# Patient Record
Sex: Male | Born: 1965 | Race: Black or African American | Hispanic: No | Marital: Married | Smoking: Current every day smoker
Health system: Southern US, Community
[De-identification: ages and names within clinical notes are randomized; demographics above are authoritative.]

## PROBLEM LIST (undated history)

## (undated) DIAGNOSIS — R9431 Abnormal electrocardiogram [ECG] [EKG]: Secondary | ICD-10-CM

## (undated) DIAGNOSIS — G709 Myoneural disorder, unspecified: Secondary | ICD-10-CM

## (undated) DIAGNOSIS — K529 Noninfective gastroenteritis and colitis, unspecified: Secondary | ICD-10-CM

## (undated) DIAGNOSIS — T148XXA Other injury of unspecified body region, initial encounter: Secondary | ICD-10-CM

## (undated) DIAGNOSIS — G629 Polyneuropathy, unspecified: Secondary | ICD-10-CM

## (undated) DIAGNOSIS — I10 Essential (primary) hypertension: Secondary | ICD-10-CM

## (undated) HISTORY — PX: OTHER SURGICAL HISTORY: SHX169

## (undated) HISTORY — PX: FINGER SURGERY: SHX640

## (undated) HISTORY — DX: Abnormal electrocardiogram (ECG) (EKG): R94.31

## (undated) HISTORY — DX: Polyneuropathy, unspecified: G62.9

## (undated) HISTORY — DX: Myoneural disorder, unspecified: G70.9

---

## 1998-07-30 ENCOUNTER — Ambulatory Visit (HOSPITAL_COMMUNITY): Admission: RE | Admit: 1998-07-30 | Discharge: 1998-07-31 | Payer: Self-pay | Admitting: General Surgery

## 2005-06-17 ENCOUNTER — Ambulatory Visit (HOSPITAL_COMMUNITY): Admission: RE | Admit: 2005-06-17 | Discharge: 2005-06-17 | Payer: Self-pay | Admitting: Chiropractic Medicine

## 2007-01-08 ENCOUNTER — Encounter: Admission: RE | Admit: 2007-01-08 | Discharge: 2007-01-08 | Payer: Self-pay | Admitting: Neurology

## 2007-02-02 ENCOUNTER — Encounter: Admission: RE | Admit: 2007-02-02 | Discharge: 2007-02-02 | Payer: Self-pay | Admitting: Neurology

## 2007-03-28 ENCOUNTER — Emergency Department (HOSPITAL_COMMUNITY): Admission: EM | Admit: 2007-03-28 | Discharge: 2007-03-28 | Payer: Self-pay | Admitting: Emergency Medicine

## 2007-04-13 ENCOUNTER — Encounter: Admission: RE | Admit: 2007-04-13 | Discharge: 2007-05-26 | Payer: Self-pay | Admitting: Neurology

## 2007-05-20 ENCOUNTER — Encounter: Admission: RE | Admit: 2007-05-20 | Discharge: 2007-05-20 | Payer: Self-pay | Admitting: Orthopaedic Surgery

## 2007-07-06 ENCOUNTER — Encounter: Admission: RE | Admit: 2007-07-06 | Discharge: 2007-07-06 | Payer: Self-pay | Admitting: Neurosurgery

## 2008-03-23 ENCOUNTER — Emergency Department (HOSPITAL_COMMUNITY): Admission: EM | Admit: 2008-03-23 | Discharge: 2008-03-24 | Payer: Self-pay | Admitting: Emergency Medicine

## 2010-01-26 ENCOUNTER — Emergency Department (HOSPITAL_COMMUNITY): Admission: EM | Admit: 2010-01-26 | Discharge: 2010-01-27 | Payer: Self-pay | Admitting: Emergency Medicine

## 2010-02-14 DEATH — deceased
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
## 2011-11-03 DIAGNOSIS — M1990 Unspecified osteoarthritis, unspecified site: Secondary | ICD-10-CM

## 2011-11-03 HISTORY — DX: Unspecified osteoarthritis, unspecified site: M19.90

## 2013-02-25 ENCOUNTER — Ambulatory Visit: Payer: No Typology Code available for payment source | Attending: Internal Medicine

## 2013-02-26 ENCOUNTER — Encounter (HOSPITAL_COMMUNITY): Payer: Self-pay

## 2013-02-26 ENCOUNTER — Emergency Department (HOSPITAL_COMMUNITY)
Admission: EM | Admit: 2013-02-26 | Discharge: 2013-02-26 | Disposition: A | Discharge status: Home / Self Care | Payer: No Typology Code available for payment source | Source: Home / Self Care | Attending: Family Medicine | Admitting: Family Medicine

## 2013-02-26 DIAGNOSIS — G609 Hereditary and idiopathic neuropathy, unspecified: Secondary | ICD-10-CM

## 2013-02-26 DIAGNOSIS — G629 Polyneuropathy, unspecified: Secondary | ICD-10-CM

## 2013-02-26 HISTORY — DX: Other injury of unspecified body region, initial encounter: T14.8XXA

## 2013-02-26 MED ORDER — GABAPENTIN 600 MG PO TABS: 600.0000 mg | ORAL_TABLET | Freq: Two times a day (BID) | ORAL | Status: DC
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
## 2013-02-27 NOTE — ED Provider Notes (Signed)
Medical screening examination/treatment/procedure(s) were performed by a resident physician or non-physician practitioner and as the supervising physician I was immediately available for consultation/collaboration.  Lavetta Geier, MD    Toshiyuki Fredell S Leland Staszewski, MD 02/27/13 0849 

## 2013-05-05 ENCOUNTER — Encounter (HOSPITAL_COMMUNITY): Payer: Self-pay | Admitting: Emergency Medicine

## 2013-05-05 ENCOUNTER — Emergency Department (HOSPITAL_COMMUNITY)
Admission: EM | Admit: 2013-05-05 | Discharge: 2013-05-05 | Disposition: A | Discharge status: Home / Self Care | Payer: No Typology Code available for payment source | Source: Home / Self Care | Attending: Family Medicine | Admitting: Family Medicine

## 2013-05-05 DIAGNOSIS — G629 Polyneuropathy, unspecified: Secondary | ICD-10-CM

## 2013-05-05 DIAGNOSIS — G589 Mononeuropathy, unspecified: Secondary | ICD-10-CM

## 2013-05-05 MED ORDER — GABAPENTIN 600 MG PO TABS: 600.0000 mg | ORAL_TABLET | Freq: Two times a day (BID) | ORAL | Status: DC
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
## 2013-05-05 NOTE — ED Notes (Signed)
Here for refill of gabapentin; NAD

## 2013-07-07 ENCOUNTER — Emergency Department (INDEPENDENT_AMBULATORY_CARE_PROVIDER_SITE_OTHER)
Admission: EM | Admit: 2013-07-07 | Discharge: 2013-07-07 | Disposition: A | Discharge status: Home / Self Care | Payer: Self-pay | Source: Home / Self Care

## 2013-07-07 ENCOUNTER — Encounter (HOSPITAL_COMMUNITY): Payer: Self-pay | Admitting: Emergency Medicine

## 2013-07-07 DIAGNOSIS — IMO0002 Reserved for concepts with insufficient information to code with codable children: Secondary | ICD-10-CM

## 2013-07-07 DIAGNOSIS — M792 Neuralgia and neuritis, unspecified: Secondary | ICD-10-CM

## 2013-07-07 MED ORDER — GABAPENTIN 300 MG PO CAPS: ORAL_CAPSULE | ORAL | Status: DC

## 2013-07-07 NOTE — ED Provider Notes (Signed)
CSN: [id number]     Arrival date & time 07/07/13  [id number] History   None    Chief Complaint  Patient presents with  . Medication Refill   (Consider location/radiation/quality/duration/timing/severity/associated sxs/prior Treatment) HPI Comments: 48 year old male who presents to the urgent care for routine refill of his gabapentin. He has neuropathic pain resulting from an on the relaxant. He has had tenderness and urology referral. States the gabapentin helped significantly with pain. He does not have a physician yet but states his insurance will go into effect in 4 days.    Past Medical History  Diagnosis Date  . Nerve damage    History reviewed. No pertinent past surgical history. No family history on file. History  Substance Use Topics  . Smoking status: Current Every Day Smoker  . Smokeless tobacco: Not on file  . Alcohol Use: Yes    Review of Systems  Constitutional: Negative.   Respiratory: Negative.   Cardiovascular: Negative.   Skin: Negative.     Allergies  Review of patient's allergies indicates no known allergies.  Home Medications   Current Outpatient Rx  Name  Route  Sig  Dispense  Refill  . gabapentin (NEURONTIN) 600 MG tablet   Oral   Take 1 tablet (600 mg total) by mouth 2 (two) times daily.   60 tablet   1   . gabapentin (NEURONTIN) 300 MG capsule      2 caps BID   120 capsule   0    BP 135/78  Pulse 70  Temp(Src) 98.8 F (37.1 C) (Oral)  Resp 20  SpO2 100% Physical Exam  Nursing note and vitals reviewed. Constitutional: He is oriented to person, place, and time. He appears well-developed and well-nourished. No distress.  Neck: Normal range of motion. Neck supple.  Pulmonary/Chest: Effort normal. No respiratory distress.  Neurological: He is alert and oriented to person, place, and time.  Skin: Skin is dry.  Psychiatric: He has a normal mood and affect.    ED Course  Procedures (including critical care time) Labs Review Labs Reviewed  - No data to display Imaging Review No results found.    MDM   1. Neuropathic pain    Pain and make an appointment with a PCP soon as possible. Refill gabapentin 300 mg 2 caps twice a day #120.    [name] Missy Baksh, NP 07/07/13 1058

## 2013-07-07 NOTE — ED Notes (Signed)
Medication refill-gabapentin 300mg  2 capsules taken 2 times a day

## 2013-07-07 NOTE — Discharge Instructions (Signed)

## 2013-07-07 NOTE — ED Provider Notes (Signed)
Medical screening examination/treatment/procedure(s) were performed by non-physician practitioner and as supervising physician I was immediately available for consultation/collaboration.  Surafel Hilleary, M.D.   Euva Rundell C Bellarae Lizer, MD 07/07/13 1811 

## 2013-08-07 ENCOUNTER — Telehealth (HOSPITAL_COMMUNITY): Payer: Self-pay

## 2013-08-07 NOTE — ED Notes (Signed)
Patient presents to registration desk requesting to be seen for another refill of his medication.  Chart reviewed by [name] Mabe NP.  He stated that patient has insurance now and was instructed to find a PCP.  NP states we will not refill his medication again and that he need to find a PCP.  Patient made aware

## 2013-08-08 ENCOUNTER — Ambulatory Visit (INDEPENDENT_AMBULATORY_CARE_PROVIDER_SITE_OTHER): Payer: BC Managed Care – PPO | Admitting: Emergency Medicine

## 2013-08-08 VITALS — BP 142/92 | HR 76 | Temp 98.2°F | Resp 17 | Ht 72.0 in | Wt 252.0 lb

## 2013-08-08 DIAGNOSIS — G629 Polyneuropathy, unspecified: Secondary | ICD-10-CM

## 2013-08-08 DIAGNOSIS — G589 Mononeuropathy, unspecified: Secondary | ICD-10-CM

## 2013-08-08 MED ORDER — GABAPENTIN 600 MG PO TABS: 600.0000 mg | ORAL_TABLET | Freq: Two times a day (BID) | ORAL | Status: DC
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
## 2013-08-08 NOTE — Patient Instructions (Signed)

## 2013-10-19 ENCOUNTER — Other Ambulatory Visit: Payer: Self-pay | Admitting: Emergency Medicine

## 2014-01-22 ENCOUNTER — Ambulatory Visit (INDEPENDENT_AMBULATORY_CARE_PROVIDER_SITE_OTHER): Payer: Self-pay | Admitting: Family Medicine

## 2014-01-22 VITALS — BP 142/90 | HR 73 | Temp 98.0°F | Resp 16 | Ht 72.0 in | Wt 252.0 lb

## 2014-01-22 DIAGNOSIS — Z0184 Encounter for antibody response examination: Secondary | ICD-10-CM

## 2014-01-22 DIAGNOSIS — G629 Polyneuropathy, unspecified: Secondary | ICD-10-CM

## 2014-01-22 DIAGNOSIS — M545 Low back pain, unspecified: Secondary | ICD-10-CM

## 2014-01-22 DIAGNOSIS — G609 Hereditary and idiopathic neuropathy, unspecified: Secondary | ICD-10-CM

## 2014-01-22 DIAGNOSIS — Z789 Other specified health status: Secondary | ICD-10-CM

## 2014-01-22 MED ORDER — GABAPENTIN 600 MG PO TABS: ORAL_TABLET | ORAL | Status: DC

## 2014-01-22 NOTE — Patient Instructions (Addendum)
I refilled Neurontin for now, but you will need to call neurologist as referred prior for appointment - their information is below.  When possible, schedule physical with primary provider - we are happy to see you here, and you can schedule an appointment with Dr. Carlota Raspberry or any provider accepting new patients.  You should receive a call or letter about your lab results within the next week to 10 days. If you are not immune to MMR or varicella, can discuss these vaccinations. Return to the clinic or go to the nearest emergency room if any of your symptoms worsen or new symptoms occur.  [facility] [address], [place], [place] [address] [phone]
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
## 2014-01-24 LAB — MEASLES/MUMPS/RUBELLA IMMUNITY
MUMPS IGG: 156 [AU]/ml — AB (ref <9.00)
RUBELLA: 3.15 {index} — AB (ref <0.90)
Rubeola IgG: 113 AU/mL — ABNORMAL HIGH (ref <25.00)

## 2014-01-24 LAB — VARICELLA ZOSTER ANTIBODY, IGG: Varicella IgG: 673.9 Index — ABNORMAL HIGH (ref <135.00)

## 2014-03-22 ENCOUNTER — Telehealth: Payer: Self-pay

## 2014-03-22 NOTE — Telephone Encounter (Signed)
Patients neurology appointment was cancelled because [facility] could not get in contact with him. Patient would like another referral to be placed, as he is now ready to follow through with the referral.

## 2014-03-22 NOTE — Telephone Encounter (Signed)
Can this patient still be scheduled? Please contact pt. He would like to follow up with the neurologist.

## 2014-04-17 ENCOUNTER — Encounter: Payer: Self-pay | Admitting: Neurology

## 2014-04-17 ENCOUNTER — Ambulatory Visit (INDEPENDENT_AMBULATORY_CARE_PROVIDER_SITE_OTHER): Payer: Self-pay | Admitting: Neurology

## 2014-04-17 VITALS — BP 160/104 | HR 81 | Ht 72.0 in | Wt 255.0 lb

## 2014-04-17 DIAGNOSIS — G629 Polyneuropathy, unspecified: Secondary | ICD-10-CM | POA: Insufficient documentation

## 2014-04-17 DIAGNOSIS — M545 Low back pain: Secondary | ICD-10-CM

## 2014-04-17 MED ORDER — GABAPENTIN 600 MG PO TABS: ORAL_TABLET | ORAL | Status: DC
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
## 2015-03-29 ENCOUNTER — Other Ambulatory Visit: Payer: Self-pay | Admitting: Neurology

## 2015-05-07 ENCOUNTER — Other Ambulatory Visit: Payer: Self-pay | Admitting: Neurology

## 2015-05-07 ENCOUNTER — Telehealth: Payer: Self-pay | Admitting: Neurology

## 2015-05-07 NOTE — Telephone Encounter (Signed)
Patient called to request refill of Gabapentin, has completely ran out of medication, called our office on Friday after office had closed.

## 2015-05-07 NOTE — Telephone Encounter (Signed)
Rx has been sent  

## 2015-06-01 ENCOUNTER — Other Ambulatory Visit: Payer: Self-pay | Admitting: Neurology

## 2015-07-03 ENCOUNTER — Ambulatory Visit (INDEPENDENT_AMBULATORY_CARE_PROVIDER_SITE_OTHER): Payer: Self-pay | Admitting: Family Medicine

## 2015-07-03 VITALS — BP 138/84 | HR 95 | Temp 98.9°F | Resp 17 | Ht 72.0 in | Wt 252.0 lb

## 2015-07-03 DIAGNOSIS — G609 Hereditary and idiopathic neuropathy, unspecified: Secondary | ICD-10-CM

## 2015-07-03 MED ORDER — GABAPENTIN 600 MG PO TABS: ORAL_TABLET | ORAL | Status: DC

## 2015-07-03 MED FILL — GABAPENTIN 600 MG TABLET: 600 | 30 days supply | Qty: 60 | Fill #0

## 2015-07-03 NOTE — Progress Notes (Signed)
[facility] [address] [place] [address] [phone]  Date:  07/03/2015   Name:  Justin Burnett   DOB:  1966-02-15   MRN:  [id number]  PCP:  Jeanann Lewandowsky, MD    Chief Complaint: Medication Refill   History of Present Illness:  Justin Burnett is a 50 y.o. very pleasant male patient who presents with the following:  History of neuropathy.  Here today seeking a refill of his gabapentin. He takes this for his lower leg and foot pain- he has had the neuropathy for 5 years or so,  The gabapentin works well for him  He is uninsured and currently does not have a job- he would like very much to find a job.  Finances are a worry   He had been seen at [facility] and they had filled this last year.  However he was told that he needed to be seen prior to refills and we were his most cost effective option  Patient Active Problem List   Diagnosis Date Noted  . Neuropathy [facility])     Past Medical History  Diagnosis Date  . Nerve damage   . Neuropathy [facility])     Past Surgical History  Procedure Laterality Date  . None      Social History  Substance Use Topics  . Smoking status: Current Every Day Smoker -- 0.50 packs/day for 13 years    Types: Cigarettes  . Smokeless tobacco: Never Used  . Alcohol Use: 1.2 oz/week    2 Standard drinks or equivalent per week     Comment: per week    No family history on file.  Allergies  Allergen Reactions  . Demerol [Meperidine] Other (See Comments)    Medication list has been reviewed and updated.  Current Outpatient Prescriptions on File Prior to Visit  Medication Sig Dispense Refill  . gabapentin (NEURONTIN) 600 MG tablet TAKE 1 TABLET BY MOUTH TWICE DAILY **PLEASE SCHEDULE APPOINTMENT** 60 tablet 0   No current facility-administered medications on file prior to visit.    Review of Systems:  As per HPI- otherwise negative.   Physical Examination: Filed Vitals:   07/03/15 1532  BP: 138/84   Pulse: 95  Temp: 98.9 F (37.2 C)  Resp: 17   Filed Vitals:   07/03/15 1532  Height: 6' (1.829 m)  Weight: 252 lb (114.306 kg)   Body mass index is 34.17 kg/(m^2). Ideal Body Weight: Weight in (lb) to have BMI = 25: 183.9  GEN: WDWN, NAD, Non-toxic, A & O x 3, looks well, large build HEENT: Atraumatic, Normocephalic. Neck supple. No masses, No LAD. Ears and Nose: No external deformity. CV: RRR, No M/G/R. No JVD. No thrill. No extra heart sounds. PULM: CTA B, no wheezes, crackles, rhonchi. No retractions. No resp. distress. No accessory muscle use. ABD: S, NT, ND, +BS. No rebound. No HSM. EXTR: No c/c/e.  He endorses reduced/ numb feeling sensation of both feet in a sock distribution  NEURO Normal gait.  PSYCH: Normally interactive. Conversant. Not depressed or anxious appearing.  Calm demeanor.    Assessment and Plan: Hereditary and idiopathic peripheral neuropathy - Plan: gabapentin (NEURONTIN) 600 MG tablet  Refilled his gabapentin which has been effective for him  Signed Abbe Amsterdam, MD

## 2015-07-03 NOTE — Patient Instructions (Addendum)
It was good to see you today I refilled your medication for a year.  Take care!

## 2015-08-03 MED FILL — GABAPENTIN 600 MG TABLET: 600 | 30 days supply | Qty: 60 | Fill #1

## 2015-08-31 MED FILL — GABAPENTIN 600 MG TABLET: 600 | 30 days supply | Qty: 60 | Fill #2

## 2015-09-28 MED FILL — GABAPENTIN 600 MG TABLET: 600 | 30 days supply | Qty: 60 | Fill #3

## 2015-10-29 MED FILL — GABAPENTIN 600 MG TABLET: 600 | 30 days supply | Qty: 60 | Fill #4

## 2015-11-27 MED FILL — GABAPENTIN 600 MG TABLET: 600 | 30 days supply | Qty: 60 | Fill #5

## 2015-12-25 MED FILL — GABAPENTIN 600 MG TABLET: 600 | 30 days supply | Qty: 60 | Fill #6

## 2016-01-21 MED FILL — GABAPENTIN 600 MG TABLET: 600 | 30 days supply | Qty: 60 | Fill #7

## 2016-02-19 MED FILL — GABAPENTIN 600 MG TABLET: 600 | 30 days supply | Qty: 60 | Fill #8

## 2016-03-18 MED FILL — GABAPENTIN 600 MG TABLET: 600 | 30 days supply | Qty: 60 | Fill #9

## 2016-04-17 MED FILL — GABAPENTIN 600 MG TABLET: 600 | 30 days supply | Qty: 60 | Fill #10

## 2016-05-16 MED FILL — GABAPENTIN 600 MG TABLET: 600 | 30 days supply | Qty: 60 | Fill #11

## 2016-06-20 ENCOUNTER — Ambulatory Visit (INDEPENDENT_AMBULATORY_CARE_PROVIDER_SITE_OTHER): Payer: Self-pay | Admitting: Family Medicine

## 2016-06-20 VITALS — BP 170/104 | HR 96 | Temp 98.4°F | Resp 18 | Ht 72.0 in | Wt 253.2 lb

## 2016-06-20 DIAGNOSIS — R030 Elevated blood-pressure reading, without diagnosis of hypertension: Secondary | ICD-10-CM

## 2016-06-20 DIAGNOSIS — G609 Hereditary and idiopathic neuropathy, unspecified: Secondary | ICD-10-CM

## 2016-06-20 MED ORDER — HYDROCHLOROTHIAZIDE 25 MG PO TABS: 12.5000 mg | ORAL_TABLET | Freq: Every day | ORAL | 0 refills | Status: DC

## 2016-06-20 MED ORDER — GABAPENTIN 800 MG PO TABS: ORAL_TABLET | ORAL | 3 refills | Status: DC

## 2016-06-20 MED FILL — GABAPENTIN 800 MG TABLET: 800 | 30 days supply | Qty: 60 | Fill #0

## 2016-06-20 NOTE — Patient Instructions (Addendum)
Please schedule an annual physical exam within the next 4 weeks and return for blood pressure recheck.  Your blood pressure is elevated today. Please continue to monitor and if it remains elevated, greater than 140/80 please return for care.   IF you received an x-ray today, you will receive an invoice from [facility]. Please contact [facility] at [phone] with questions or concerns regarding your invoice.   IF you received labwork today, you will receive an invoice from [facility]. Please contact [facility] at [phone] with questions or concerns regarding your invoice.   Our billing staff will not be able to assist you with questions regarding bills from these companies.  You will be contacted with the lab results as soon as they are available. The fastest way to get your results is to activate your My Chart account. Instructions are located on the last page of this paperwork. If you have not heard from [place] regarding the results in 2 weeks, please contact this office.      Neuropathic Pain Introduction Neuropathic pain is pain caused by damage to the nerves that are responsible for certain sensations in your body (sensory nerves). The pain can be caused by damage to:  The sensory nerves that send signals to your spinal cord and brain (peripheral nervous system).  The sensory nerves in your brain or spinal cord (central nervous system). Neuropathic pain can make you more sensitive to pain. What would be a minor sensation for most people may feel very painful if you have neuropathic pain. This is usually a long-term condition that can be difficult to treat. The type of pain can differ from person to person. It may start suddenly (acute), or it may develop slowly and last for a long time (chronic). Neuropathic pain may come and go as damaged nerves heal or may stay at the same level for years. It often causes emotional distress, loss of sleep, and a lower quality of  life. What are the causes? The most common cause of damage to a sensory nerve is diabetes. Many other diseases and conditions can also cause neuropathic pain. Causes of neuropathic pain can be classified as:  Toxic. Many drugs and chemicals can cause toxic damage. The most common cause of toxic neuropathic pain is damage from drug treatment for cancer (chemotherapy).  Metabolic. This type of pain can happen when a disease causes imbalances that damage nerves. Diabetes is the most common of these diseases. Vitamin B deficiency caused by long-term alcohol abuse is another common cause.  Traumatic. Any injury that cuts, crushes, or stretches a nerve can cause damage and pain. A common example is feeling pain after losing an arm or leg (phantom limb pain).  Compression-related. If a sensory nerve gets trapped or compressed for a long period of time, the blood supply to the nerve can be cut off.  Vascular. Many blood vessel diseases can cause neuropathic pain by decreasing blood supply and oxygen to nerves.  Autoimmune. This type of pain results from diseases in which the body's defense system mistakenly attacks sensory nerves. Examples of autoimmune diseases that can cause neuropathic pain include lupus and multiple sclerosis.  Infectious. Many types of viral infections can damage sensory nerves and cause pain. Shingles infection is a common cause of this type of pain.  Inherited. Neuropathic pain can be a symptom of many diseases that are passed down through families (genetic). What are the signs or symptoms? The main symptom is pain. Neuropathic pain is often described as:  Burning.  Shock-like.  Stinging.  Hot or cold.  Itching. How is this diagnosed? No single test can diagnose neuropathic pain. Your health care provider will do a physical exam and ask you about your pain. You may use a pain scale to describe how bad your pain is. You may also have tests to see if you have a high  sensitivity to pain and to help find the cause and location of any sensory nerve damage. These tests may include:  Imaging studies, such as:  X-rays.  CT scan.  MRI.  Nerve conduction studies to test how well nerve signals travel through your sensory nerves (electrodiagnostic testing).  Stimulating your sensory nerves through electrodes on your skin and measuring the response in your spinal cord and brain (somatosensory evoked potentials). How is this treated? Treatment for neuropathic pain may change over time. You may need to try different treatment options or a combination of treatments. Some options include:  Over-the-counter pain relievers.  Prescription medicines. Some medicines used to treat other conditions may also help neuropathic pain. These include medicines to:  Control seizures (anticonvulsants).  Relieve depression (antidepressants).  Prescription-strength pain relievers (narcotics). These are usually used when other pain relievers do not help.  Transcutaneous nerve stimulation (TENS). This uses electrical currents to block painful nerve signals. The treatment is painless.  Topical and local anesthetics. These are medicines that numb the nerves. They can be injected as a nerve block or applied to the skin.  Alternative treatments, such as:  Acupuncture.  Meditation.  Massage.  Physical therapy.  Pain management programs.  Counseling. Follow these instructions at home:  Learn as much as you can about your condition.  Take medicines only as directed by your health care provider.  Work closely with all your health care providers to find what works best for you.  Have a good support system at home.  Consider joining a chronic pain support group. Contact a health care provider if:  Your pain treatments are not helping.  You are having side effects from your medicines.  You are struggling with fatigue, mood changes, depression, or anxiety. This  information is not intended to replace advice given to you by your health care provider. Make sure you discuss any questions you have with your health care provider. Document Released: 02/28/2004 Document Revised: 12/21/2015 Document Reviewed: 11/10/2013  2017 Elsevier  Hypertension Hypertension, commonly called high blood pressure, is when the force of blood pumping through your arteries is too strong. Your arteries are the blood vessels that carry blood from your heart throughout your body. A blood pressure reading consists of a higher number over a lower number, such as 110/72. The higher number (systolic) is the pressure inside your arteries when your heart pumps. The lower number (diastolic) is the pressure inside your arteries when your heart relaxes. Ideally you want your blood pressure below 120/80. Hypertension forces your heart to work harder to pump blood. Your arteries may become narrow or stiff. Having untreated or uncontrolled hypertension can cause heart attack, stroke, kidney disease, and other problems. What increases the risk? Some risk factors for high blood pressure are controllable. Others are not. Risk factors you cannot control include:  Race. You may be at higher risk if you are African American.  Age. Risk increases with age.  Gender. Men are at higher risk than women before age 70 years. After age 88, women are at higher risk than men. Risk factors you can control include:  Not getting enough  exercise or physical activity.  Being overweight.  Getting too much fat, sugar, calories, or salt in your diet.  Drinking too much alcohol. What are the signs or symptoms? Hypertension does not usually cause signs or symptoms. Extremely high blood pressure (hypertensive crisis) may cause headache, anxiety, shortness of breath, and nosebleed. How is this diagnosed? To check if you have hypertension, your health care provider will measure your blood pressure while you are  seated, with your arm held at the level of your heart. It should be measured at least twice using the same arm. Certain conditions can cause a difference in blood pressure between your right and left arms. A blood pressure reading that is higher than normal on one occasion does not mean that you need treatment. If it is not clear whether you have high blood pressure, you may be asked to return on a different day to have your blood pressure checked again. Or, you may be asked to monitor your blood pressure at home for 1 or more weeks. How is this treated? Treating high blood pressure includes making lifestyle changes and possibly taking medicine. Living a healthy lifestyle can help lower high blood pressure. You may need to change some of your habits. Lifestyle changes may include:  Following the DASH diet. This diet is high in fruits, vegetables, and whole grains. It is low in salt, red meat, and added sugars.  Keep your sodium intake below 2,300 mg per day.  Getting at least 30-45 minutes of aerobic exercise at least 4 times per week.  Losing weight if necessary.  Not smoking.  Limiting alcoholic beverages.  Learning ways to reduce stress. Your health care provider may prescribe medicine if lifestyle changes are not enough to get your blood pressure under control, and if one of the following is true:  You are 53-45 years of age and your systolic blood pressure is above 140.  You are 33 years of age or older, and your systolic blood pressure is above 150.  Your diastolic blood pressure is above 90.  You have diabetes, and your systolic blood pressure is over 140 or your diastolic blood pressure is over 90.  You have kidney disease and your blood pressure is above 140/90.  You have heart disease and your blood pressure is above 140/90. Your personal target blood pressure may vary depending on your medical conditions, your age, and other factors. Follow these instructions at  home:  Have your blood pressure rechecked as directed by your health care provider.  Take medicines only as directed by your health care provider. Follow the directions carefully. Blood pressure medicines must be taken as prescribed. The medicine does not work as well when you skip doses. Skipping doses also puts you at risk for problems.  Do not smoke.  Monitor your blood pressure at home as directed by your health care provider. Contact a health care provider if:  You think you are having a reaction to medicines taken.  You have recurrent headaches or feel dizzy.  You have swelling in your ankles.  You have trouble with your vision. Get help right away if:  You develop a severe headache or confusion.  You have unusual weakness, numbness, or feel faint.  You have severe chest or abdominal pain.  You vomit repeatedly.  You have trouble breathing. This information is not intended to replace advice given to you by your health care provider. Make sure you discuss any questions you have with your health care provider.  Document Released: 06/02/2005 Document Revised: 11/08/2015 Document Reviewed: 03/25/2013 Elsevier Interactive Patient Education  2017 [employer]

## 2016-06-20 NOTE — Progress Notes (Signed)
Patient ID: Justin Burnett, male    DOB: [birth year], 51 y.o.   MRN: [id number]  PCP: Jeanann Lewandowsky, MD  Chief Complaint  Patient presents with  . Medication Refill    Gabapentin    Subjective:  HPI  51 year old male presents for medication refill.  Neuropathy Pt reports a chronic hx of bilateral lower leg and foot neuropathy. He has chronically taken Gabapentin 600 mg BID for management of symptoms.  He reports good control of pain with medication, although he would be interested increased dose of Gabapentin.  Elevated Blood Pressure Reports that he works out to control his blood pressure. He reports that its been several years since he has had a  complete physical exam.    Social History   Social History  . Marital status: Married    Spouse name: Justin Burnett   . Number of children: 1  . Years of education: GED   Occupational History  . Not on file.   Social History Main Topics  . Smoking status: Current Every Day Smoker    Packs/day: 0.50    Years: 13.00    Types: Cigarettes  . Smokeless tobacco: Never Used  . Alcohol use 1.2 oz/week    2 Standard drinks or equivalent per week     Comment: per week  . Drug use: No  . Sexual activity: Not on file   Other Topics Concern  . Not on file   Social History Narrative   Patient lives at home with his wife Environmental education officer).   Patient is unemployed   Engineer, drilling. School.   Right handed.    Caffeine coffee and mountain dew.       No family history on file. Review of Systems  See HPI  Patient Active Problem List   Diagnosis Date Noted  . Neuropathy (HCC)     Allergies  Allergen Reactions  . Demerol [Meperidine] Other (See Comments)    Prior to Admission medications   Medication Sig Start Date End Date Taking? Authorizing Provider  gabapentin (NEURONTIN) 600 MG tablet TAKE 1 TABLET BY MOUTH TWICE DAILY 07/03/15  Yes Pearline Cables, MD    Past Medical, Surgical Family and Social History reviewed  and updated.    Objective:   Today's Vitals   06/20/16 1445  BP: (!) 170/104  Pulse: 96  Resp: 18  Temp: 98.4 F (36.9 C)  TempSrc: Oral  SpO2: 98%  Weight: 253 lb 3.2 oz (114.9 kg)  Height: 6' (1.829 m)    Wt Readings from Last 3 Encounters:  06/20/16 253 lb 3.2 oz (114.9 kg)  07/03/15 252 lb (114.3 kg)  04/17/14 255 lb (115.7 kg)   Physical Exam  Constitutional: He is oriented to person, place, and time. He appears well-developed and well-nourished.  HENT:  Head: Normocephalic and atraumatic.  Right Ear: External ear normal.  Left Ear: External ear normal.  Eyes: Pupils are equal, round, and reactive to light.  Neck: Normal range of motion. Neck supple.  Cardiovascular: Normal rate, regular rhythm, normal heart sounds and intact distal pulses.   No murmur heard. Pulmonary/Chest: Effort normal and breath sounds normal.  Neurological: He is alert and oriented to person, place, and time.  Skin: Skin is warm and dry.  Psychiatric: He has a normal mood and affect. His behavior is normal. Judgment and thought content normal.     Assessment & Plan:  1. Hereditary and idiopathic peripheral neuropathy - gabapentin (NEURONTIN) 800 MG tablet;  TAKE 1 TABLET BY MOUTH TWICE DAILY  Dispense: 180 tablet; Refill: 3  2. Elevated blood pressure reading without diagnosis of hypertension Refused to take blood pressure medication. Blood pressure elevated greater than 160 x 2 readings. Patient reports that his pressures are normal in the 120's when he works out. I prescribed 12.5 HCTZ and requested that he return for a blood pressure recheck   -Hydrochlorothiazide 12.5 daily.   Schedule physical exam within the next 4 weeks.  Godfrey PickKimberly S. Tiburcio PeaHarris, MSN, FNP-C [facility] [phone]

## 2016-07-30 MED FILL — GABAPENTIN 800 MG TABLET: 800 | 30 days supply | Qty: 60 | Fill #1

## 2016-09-12 MED FILL — GABAPENTIN 800 MG TABLET: 800 | 30 days supply | Qty: 60 | Fill #2

## 2016-10-20 MED FILL — GABAPENTIN 800 MG TABLET: 800 | 30 days supply | Qty: 60 | Fill #3

## 2016-11-25 MED FILL — GABAPENTIN 800 MG TABLET: 800 | 30 days supply | Qty: 60 | Fill #4

## 2016-12-30 MED FILL — GABAPENTIN 800 MG TABLET: 800 | 30 days supply | Qty: 60 | Fill #5

## 2017-02-02 MED FILL — GABAPENTIN 800 MG TABLET: 800 | 30 days supply | Qty: 60 | Fill #6

## 2017-03-02 MED FILL — GABAPENTIN 800 MG TABLET: 800 | 30 days supply | Qty: 60 | Fill #7

## 2017-04-02 MED FILL — GABAPENTIN 800 MG TABLET: 800 | 30 days supply | Qty: 60 | Fill #8

## 2017-04-30 MED FILL — GABAPENTIN 800 MG TABLET: 800 | 30 days supply | Qty: 60 | Fill #9

## 2017-06-01 MED FILL — GABAPENTIN 800 MG TABLET: 800 | 30 days supply | Qty: 60 | Fill #10

## 2017-07-01 ENCOUNTER — Other Ambulatory Visit: Payer: Self-pay | Admitting: Family Medicine

## 2017-07-01 DIAGNOSIS — G609 Hereditary and idiopathic neuropathy, unspecified: Secondary | ICD-10-CM

## 2017-07-03 ENCOUNTER — Other Ambulatory Visit: Payer: Self-pay | Admitting: Family Medicine

## 2017-07-03 DIAGNOSIS — G609 Hereditary and idiopathic neuropathy, unspecified: Secondary | ICD-10-CM

## 2017-07-04 ENCOUNTER — Other Ambulatory Visit: Payer: Self-pay

## 2017-07-04 ENCOUNTER — Encounter (HOSPITAL_COMMUNITY): Payer: Self-pay | Admitting: Emergency Medicine

## 2017-07-04 ENCOUNTER — Ambulatory Visit (HOSPITAL_COMMUNITY)
Admission: EM | Admit: 2017-07-04 | Discharge: 2017-07-04 | Disposition: A | Discharge status: Home / Self Care | Payer: Self-pay | Attending: Family Medicine | Admitting: Family Medicine

## 2017-07-04 DIAGNOSIS — G608 Other hereditary and idiopathic neuropathies: Secondary | ICD-10-CM

## 2017-07-04 DIAGNOSIS — G609 Hereditary and idiopathic neuropathy, unspecified: Secondary | ICD-10-CM

## 2017-07-04 DIAGNOSIS — Z760 Encounter for issue of repeat prescription: Secondary | ICD-10-CM

## 2017-07-04 HISTORY — DX: Essential (primary) hypertension: I10

## 2017-07-04 MED ORDER — GABAPENTIN 800 MG PO TABS: ORAL_TABLET | ORAL | 2 refills | Status: DC
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
## 2017-07-04 NOTE — ED Triage Notes (Signed)
Requesting gabapentin refills, denies having a pcp.  States taking medicine for shooting pain into feet

## 2017-07-13 MED FILL — GABAPENTIN 800 MG TABLET: 800 | 30 days supply | Qty: 60 | Fill #0

## 2017-08-14 MED FILL — GABAPENTIN 800 MG TABLET: 800 | 30 days supply | Qty: 60 | Fill #1

## 2017-09-14 MED FILL — GABAPENTIN 800 MG TABLET: 800 | 30 days supply | Qty: 60 | Fill #2

## 2017-10-13 MED FILL — GABAPENTIN 800 MG TABLET: 800 | 30 days supply | Qty: 60 | Fill #3

## 2017-11-11 MED FILL — GABAPENTIN 800 MG TABLET: 800 | 30 days supply | Qty: 60 | Fill #4

## 2017-12-10 MED FILL — GABAPENTIN 800 MG TABLET: 800 | 30 days supply | Qty: 60 | Fill #5

## 2018-01-08 MED FILL — GABAPENTIN 800 MG TABLET: 800 | 30 days supply | Qty: 60 | Fill #6

## 2018-02-05 MED FILL — GABAPENTIN 800 MG TABLET: 800 | 30 days supply | Qty: 60 | Fill #7

## 2018-03-09 MED FILL — GABAPENTIN 800 MG TABLET: 800 | 20 days supply | Qty: 40 | Fill #8

## 2018-03-27 ENCOUNTER — Encounter (HOSPITAL_COMMUNITY): Payer: Self-pay

## 2018-03-27 ENCOUNTER — Other Ambulatory Visit: Payer: Self-pay

## 2018-03-27 ENCOUNTER — Ambulatory Visit (HOSPITAL_COMMUNITY)
Admission: EM | Admit: 2018-03-27 | Discharge: 2018-03-27 | Disposition: A | Discharge status: Home / Self Care | Payer: Self-pay | Attending: Family Medicine | Admitting: Family Medicine

## 2018-03-27 DIAGNOSIS — G609 Hereditary and idiopathic neuropathy, unspecified: Secondary | ICD-10-CM

## 2018-03-27 DIAGNOSIS — Z760 Encounter for issue of repeat prescription: Secondary | ICD-10-CM

## 2018-03-27 DIAGNOSIS — I10 Essential (primary) hypertension: Secondary | ICD-10-CM

## 2018-03-27 DIAGNOSIS — G608 Other hereditary and idiopathic neuropathies: Secondary | ICD-10-CM

## 2018-03-27 MED ORDER — GABAPENTIN 800 MG PO TABS: ORAL_TABLET | ORAL | 2 refills | Status: DC

## 2018-03-27 NOTE — ED Triage Notes (Signed)
Pt presents to [facility] for medication refill (Gabapentin)

## 2018-03-29 MED FILL — GABAPENTIN 800 MG TABLET: 800 | 30 days supply | Qty: 60 | Fill #0

## 2018-03-29 NOTE — ED Provider Notes (Signed)
[facility]   [id number] 03/27/18 Arrival Time: 1249  ASSESSMENT & PLAN:  1. Encounter for medication refill   2. Hereditary and idiopathic peripheral neuropathy     Meds ordered this encounter  Medications  . gabapentin (NEURONTIN) 800 MG tablet    Sig: TAKE 1 TABLET BY MOUTH TWICE DAILY    Dispense:  180 tablet    Refill:  2   Encouraged him to continue to seek a primary care provider. He does not have insurance yet but hopes to soon when he finds a job.  Reviewed expectations re: course of current medical issues. Questions answered. Outlined signs and symptoms indicating need for more acute intervention. Patient verbalized understanding. After Visit Summary given.   SUBJECTIVE: History from: patient. Justin Burnett is a 52 y.o. male who presents requesting medication refill. Gabapentin. No current concerns.  Current medical problems include:  Past Medical History:  Diagnosis Date  . Hypertension   . Nerve damage   . Neuropathy     No current facility-administered medications for this encounter.   Current Outpatient Medications:  .  gabapentin (NEURONTIN) 800 MG tablet, TAKE 1 TABLET BY MOUTH TWICE DAILY, Disp: 180 tablet, Rfl: 2 .  hydrochlorothiazide (HYDRODIURIL) 25 MG tablet, Take 0.5 tablets (12.5 mg total) by mouth daily., Disp: 30 tablet, Rfl: 0  ROS: As per HPI.   OBJECTIVE:  Vitals:   03/27/18 1322 03/27/18 1323  BP: (!) 148/95   Pulse: 75   Resp: 17   Temp: 98.6 F (37 C)   TempSrc: Oral   SpO2: 100% 100%    General appearance: alert; no distress Lungs: clear to auscultation bilaterally Heart: regular rate and rhythm Abdomen: soft, non-tender Extremities: no edema; symmetrical with no gross deformities Skin: warm and dry Neurologic: normal gait Psychological: alert and cooperative; normal mood and affect   Allergies  Allergen Reactions  . Meperidine Other (See Comments)    Causes seizure-like activities    Past  Medical History:  Diagnosis Date  . Hypertension   . Nerve damage   . Neuropathy    Social History   Socioeconomic History  . Marital status: Married    Spouse name: [name]   . Number of children: 1  . Years of education: GED  . Highest education level: Not on file  Occupational History  . Not on file  Social Needs  . Financial resource strain: Not on file  . Food insecurity:    Worry: Not on file    Inability: Not on file  . Transportation needs:    Medical: Not on file    Non-medical: Not on file  Tobacco Use  . Smoking status: Current Every Day Smoker    Packs/day: 0.50    Years: 13.00    Pack years: 6.50    Types: Cigarettes  . Smokeless tobacco: Never Used  Substance and Sexual Activity  . Alcohol use: Yes    Alcohol/week: 2.0 standard drinks    Types: 2 Standard drinks or equivalent per week    Comment: per week  . Drug use: No  . Sexual activity: Not Currently  Lifestyle  . Physical activity:    Days per week: Not on file    Minutes per session: Not on file  . Stress: Not on file  Relationships  . Social connections:    Talks on phone: Not on file    Gets together: Not on file    Attends religious service: Not on file  Active member of club or organization: Not on file    Attends meetings of clubs or organizations: Not on file    Relationship status: Not on file  . Intimate partner violence:    Fear of current or ex partner: Not on file    Emotionally abused: Not on file    Physically abused: Not on file    Forced sexual activity: Not on file  Other Topics Concern  . Not on file  Social History Narrative   Patient lives at home with his wife Environmental education officer).   Patient is unemployed   Engineer, drilling. School.   Right handed.    Caffeine coffee and mountain dew.   History reviewed. No pertinent family history. Past Surgical History:  Procedure Laterality Date  . None       Mardella Layman, MD 03/29/18 [phone]

## 2018-04-27 MED FILL — GABAPENTIN 800 MG TABLET: 800 | 30 days supply | Qty: 60 | Fill #1

## 2018-05-27 MED FILL — GABAPENTIN 800 MG TABLET: 800 | 30 days supply | Qty: 60 | Fill #2

## 2018-06-25 MED FILL — GABAPENTIN 800 MG TABLET: 800 | 30 days supply | Qty: 60 | Fill #3

## 2018-07-26 MED FILL — GABAPENTIN 800 MG TABLET: 800 | 30 days supply | Qty: 60 | Fill #4

## 2018-08-20 MED FILL — GABAPENTIN 800 MG TABLET: 800 | 30 days supply | Qty: 60 | Fill #5 | Status: TO

## 2018-09-24 MED FILL — GABAPENTIN 800 MG TABS: 800 | 30 days supply | Qty: 60 | Fill #0

## 2018-10-22 MED FILL — GABAPENTIN 800 MG TABS: 800 | 30 days supply | Qty: 60 | Fill #1

## 2018-11-23 MED FILL — GABAPENTIN 800 MG TABS: 800 | 30 days supply | Qty: 60 | Fill #2

## 2018-12-23 ENCOUNTER — Other Ambulatory Visit: Payer: Self-pay

## 2018-12-23 ENCOUNTER — Encounter (HOSPITAL_COMMUNITY): Payer: Self-pay

## 2018-12-23 ENCOUNTER — Ambulatory Visit (HOSPITAL_COMMUNITY)
Admission: EM | Admit: 2018-12-23 | Discharge: 2018-12-23 | Disposition: A | Discharge status: Home / Self Care | Payer: Self-pay | Attending: Family Medicine | Admitting: Family Medicine

## 2018-12-23 DIAGNOSIS — G609 Hereditary and idiopathic neuropathy, unspecified: Secondary | ICD-10-CM

## 2018-12-23 DIAGNOSIS — T7840XA Allergy, unspecified, initial encounter: Secondary | ICD-10-CM

## 2018-12-23 DIAGNOSIS — Z760 Encounter for issue of repeat prescription: Secondary | ICD-10-CM

## 2018-12-23 DIAGNOSIS — I10 Essential (primary) hypertension: Secondary | ICD-10-CM

## 2018-12-23 MED ORDER — GABAPENTIN 800 MG PO TABS: ORAL_TABLET | ORAL | 1 refills | Status: DC

## 2018-12-23 MED FILL — GABAPENTIN 800 MG TABLET: 800 | 30 days supply | Qty: 60 | Fill #0

## 2018-12-23 NOTE — ED Triage Notes (Signed)
Pt presents for medication refill on gabapentin.

## 2018-12-23 NOTE — Discharge Instructions (Signed)
I am refilling your gabapentin. I am giving you a contact for primary care at [facility] need to follow-up with them for management of medical problems and refills. Call them today to schedule an appointment. Make sure you are monitoring your blood pressures and salt intake. You are hypertensive today You could try some over-the-counter allergy medicine for the postnasal drip and sinus issues.  Zyrtec is a good option.

## 2018-12-25 NOTE — ED Provider Notes (Signed)
[place]    CSN: [id number] Arrival date & time: 12/23/18  1230      History   Chief Complaint Chief Complaint  Patient presents with  . Medication Refill    HPI IGNAZIO KINCAID is a 53 y.o. male.   Pt is a 53 year old male that presents with medication refill. PMH of HTN, neuropathy. He is requesting refill for gabapentin. He has been here multiple times for this. Does not have a PCP. He is hypertensive here today. He has been taking HCTZ for blood pressure. He is also having allergy issues. Has not been taking any medication for this. Denies any headache, dizziness, chest pain, SOB, vision issues. He otherwise feels well.   ROS per HPI      Past Medical History:  Diagnosis Date  . Hypertension   . Nerve damage   . Neuropathy     Patient Active Problem List   Diagnosis Date Noted  . Neuropathy     Past Surgical History:  Procedure Laterality Date  . None         Home Medications    Prior to Admission medications   Medication Sig Start Date End Date Taking? Authorizing Provider  gabapentin (NEURONTIN) 800 MG tablet TAKE 1 TABLET BY MOUTH TWICE DAILY 12/23/18   Price Lachapelle A, NP  hydrochlorothiazide (HYDRODIURIL) 25 MG tablet Take 0.5 tablets (12.5 mg total) by mouth daily. 06/20/16   Scot Jun, FNP    Family History History reviewed. No pertinent family history.  Social History Social History   Tobacco Use  . Smoking status: Current Every Day Smoker    Packs/day: 0.50    Years: 13.00    Pack years: 6.50    Types: Cigarettes  . Smokeless tobacco: Never Used  Substance Use Topics  . Alcohol use: Yes    Alcohol/week: 2.0 standard drinks    Types: 2 Standard drinks or equivalent per week    Comment: per week  . Drug use: No     Allergies   Meperidine   Review of Systems Review of Systems   Physical Exam Triage Vital Signs ED Triage Vitals [12/23/18 1351]  Enc Vitals Group     BP (!) 159/108     Pulse Rate 84   Resp 18     Temp 98.1 F (36.7 C)     Temp Source Oral     SpO2 99 %     Weight      Height      Head Circumference      Peak Flow      Pain Score 0     Pain Loc      Pain Edu?      Excl. in [place]?    No data found.  Updated Vital Signs BP (!) 159/108 (BP Location: Left Arm)   Pulse 84   Temp 98.1 F (36.7 C) (Oral)   Resp 18   SpO2 99%   Visual Acuity Right Eye Distance:   Left Eye Distance:   Bilateral Distance:    Right Eye Near:   Left Eye Near:    Bilateral Near:     Physical Exam Vitals signs and nursing note reviewed.  Constitutional:      Appearance: Normal appearance.  HENT:     Head: Normocephalic and atraumatic.     Nose: Nose normal.  Eyes:     Conjunctiva/sclera: Conjunctivae normal.  Neck:     Musculoskeletal: Normal range of motion.  Cardiovascular:     Rate and Rhythm: Normal rate and regular rhythm.  Pulmonary:     Effort: Pulmonary effort is normal.     Breath sounds: Normal breath sounds.  Musculoskeletal: Normal range of motion.  Skin:    General: Skin is warm and dry.  Neurological:     General: No focal deficit present.     Mental Status: He is alert.  Psychiatric:        Mood and Affect: Mood normal.      UC Treatments / Results  Labs (all labs ordered are listed, but only abnormal results are displayed) Labs Reviewed - No data to display  EKG   Radiology No results found.  Procedures Procedures (including critical care time)  Medications Ordered in UC Medications - No data to display  Initial Impression / Assessment and Plan / UC Course  I have reviewed the triage vital signs and the nursing notes.  Pertinent labs & imaging results that were available during my care of the patient were reviewed by me and considered in my medical decision making (see chart for details).     Refilling gabapentin as requested. Recommended follow up with PCP for further health maintenance and refills  He may need BP med change.  Recommended zyrtec for allergies.    Final Clinical Impressions(s) / UC Diagnoses   Final diagnoses:  Medication refill     Discharge Instructions     I am refilling your gabapentin. I am giving [facility] You need to follow-up with them for management of medical problems and refills. Call them today to schedule an appointment. Make sure you are monitoring your blood pressures and salt intake. You are hypertensive today You could try some over-the-counter allergy medicine for the postnasal drip and sinus issues.  Zyrtec is a good option.   ED Prescriptions    Medication Sig Dispense Auth. Provider   gabapentin (NEURONTIN) 800 MG tablet TAKE 1 TABLET BY MOUTH TWICE DAILY 180 tablet Dahlia ByesBast, Cameran Pettey A, NP     Controlled Substance Prescriptions [place] Controlled Substance Registry consulted? no   Janace ArisBast, Ryleah Miramontes A, NP 12/25/18 [phone]

## 2019-01-20 MED FILL — GABAPENTIN 800 MG TABLET: 800 | 30 days supply | Qty: 60 | Fill #1

## 2019-02-18 MED FILL — GABAPENTIN 800 MG TABLET: 800 | 30 days supply | Qty: 60 | Fill #2

## 2019-03-21 MED FILL — GABAPENTIN 800 MG TABLET: 800 | 30 days supply | Qty: 60 | Fill #3

## 2019-04-18 MED FILL — GABAPENTIN 800 MG TABLET: 800 | 30 days supply | Qty: 60 | Fill #0

## 2019-04-29 MED FILL — AMLODIPINE BESYLATE 5 MG TA: 5 | 30 days supply | Qty: 30 | Fill #0

## 2019-05-16 MED FILL — GABAPENTIN 800 MG TABLET: 800 | 30 days supply | Qty: 60 | Fill #4

## 2019-06-16 MED FILL — GABAPENTIN 800 MG TABLET: 800 | 30 days supply | Qty: 60 | Fill #5

## 2019-07-18 MED FILL — GABAPENTIN 800 MG TABLET: 800 | 30 days supply | Qty: 60 | Fill #0

## 2019-07-18 MED FILL — AMLODIPINE BESYLATE 5 MG TA: 5 | 90 days supply | Qty: 90 | Fill #0

## 2019-08-15 MED FILL — GABAPENTIN 800 MG TABLET: 800 | 30 days supply | Qty: 60 | Fill #1

## 2019-09-15 MED FILL — GABAPENTIN 800 MG TABLET: 800 | 30 days supply | Qty: 60 | Fill #2

## 2019-10-13 MED FILL — AMLODIPINE BESYLATE 5 MG TA: 5 | 90 days supply | Qty: 90 | Fill #1

## 2019-10-13 MED FILL — GABAPENTIN 800 MG TABLET: 800 | 30 days supply | Qty: 60 | Fill #3

## 2019-11-15 MED FILL — GABAPENTIN 800 MG TABLET: 800 | 30 days supply | Qty: 60 | Fill #4

## 2019-11-29 MED FILL — NEOMYCIN-POLYMYXIN-HC EAR S: 3.5-10000-1 | 8 days supply | Qty: 10 | Fill #0

## 2019-11-29 MED FILL — predniSONE 20 MG TABS: 20 | 5 days supply | Qty: 5 | Fill #0

## 2019-11-29 MED FILL — AMOX-CLAV 875-125 MG TABLET: 875-125 | 7 days supply | Qty: 14 | Fill #0

## 2019-12-13 MED FILL — GABAPENTIN 800 MG TABLET: 800 | 30 days supply | Qty: 60 | Fill #5

## 2020-01-12 ENCOUNTER — Other Ambulatory Visit (HOSPITAL_COMMUNITY): Payer: Self-pay | Admitting: Family

## 2020-01-12 MED FILL — GABAPENTIN 800 MG TABLET: 800 | 30 days supply | Qty: 60 | Fill #0

## 2020-01-12 MED FILL — HYDROCHLOROTHIAZIDE 12.5 MG: 12.5 | 90 days supply | Qty: 90 | Fill #0

## 2020-01-12 MED FILL — AMLODIPINE BESYLATE 10 MG T: 10 | 90 days supply | Qty: 90 | Fill #0

## 2020-02-10 MED FILL — GABAPENTIN 800 MG TABLET: 800 | 30 days supply | Qty: 60 | Fill #1

## 2020-03-09 MED FILL — GABAPENTIN 800 MG TABLET: 800 | 30 days supply | Qty: 60 | Fill #2

## 2020-04-10 MED FILL — GABAPENTIN 800 MG TABLET: 800 | 30 days supply | Qty: 60 | Fill #3

## 2020-04-10 MED FILL — AMLODIPINE BESYLATE 10 MG T: 10 | 90 days supply | Qty: 90 | Fill #1

## 2020-05-07 MED FILL — GABAPENTIN 800 MG TABLET: 800 | 30 days supply | Qty: 60 | Fill #4

## 2020-06-04 MED FILL — GABAPENTIN 800 MG TABLET: 800 | 30 days supply | Qty: 60 | Fill #5

## 2020-07-09 ENCOUNTER — Other Ambulatory Visit (HOSPITAL_COMMUNITY): Payer: Self-pay | Admitting: Nurse Practitioner

## 2020-07-09 MED FILL — GABAPENTIN 800 MG TABLET: 800 | 30 days supply | Qty: 60 | Fill #0

## 2020-07-12 ENCOUNTER — Other Ambulatory Visit (HOSPITAL_COMMUNITY): Payer: Self-pay | Admitting: Urgent Care

## 2020-07-12 MED FILL — AMLODIPINE BESYLATE 5 MG TA: 5 | 90 days supply | Qty: 90 | Fill #0

## 2020-08-08 MED FILL — GABAPENTIN 800 MG TABLET: 800 | 30 days supply | Qty: 60 | Fill #1

## 2020-09-07 MED FILL — GABAPENTIN 800 MG TABLET: 800 | 30 days supply | Qty: 60 | Fill #2

## 2020-10-05 ENCOUNTER — Other Ambulatory Visit (HOSPITAL_COMMUNITY): Payer: Self-pay

## 2020-10-05 MED FILL — Amlodipine Besylate Tab 5 MG (Base Equivalent): ORAL | 90 days supply | Qty: 90 | Fill #0 | Status: AC

## 2020-10-05 MED FILL — Gabapentin Tab 800 MG: ORAL | 30 days supply | Qty: 60 | Fill #0 | Status: AC

## 2020-11-05 ENCOUNTER — Other Ambulatory Visit (HOSPITAL_COMMUNITY): Payer: Self-pay

## 2020-11-05 ENCOUNTER — Other Ambulatory Visit: Payer: Self-pay

## 2020-11-05 MED ORDER — AMLODIPINE BESYLATE 10 MG PO TABS
10.0000 mg | ORAL_TABLET | Freq: Every day | ORAL | 0 refills | Status: DC
  Filled 2020-11-05 – 2020-12-20 (×2): qty 90, 90d supply, fill #0

## 2020-11-05 MED ORDER — GABAPENTIN 800 MG PO TABS
800.0000 mg | ORAL_TABLET | Freq: Two times a day (BID) | ORAL | 5 refills | Status: DC
  Filled 2020-11-05: qty 30, 15d supply, fill #0

## 2020-11-08 ENCOUNTER — Other Ambulatory Visit (HOSPITAL_COMMUNITY): Payer: Self-pay

## 2020-11-08 MED ORDER — GABAPENTIN 800 MG PO TABS
800.0000 mg | ORAL_TABLET | Freq: Two times a day (BID) | ORAL | 2 refills | Status: DC
  Filled 2020-11-20: qty 60, 30d supply, fill #0
  Filled 2020-12-20: qty 60, 30d supply, fill #1
  Filled 2021-01-21: qty 60, 30d supply, fill #2

## 2020-11-20 ENCOUNTER — Other Ambulatory Visit (HOSPITAL_COMMUNITY): Payer: Self-pay

## 2020-12-20 ENCOUNTER — Other Ambulatory Visit (HOSPITAL_COMMUNITY): Payer: Self-pay

## 2021-01-21 ENCOUNTER — Other Ambulatory Visit (HOSPITAL_COMMUNITY): Payer: Self-pay

## 2021-02-13 ENCOUNTER — Other Ambulatory Visit (HOSPITAL_COMMUNITY): Payer: Self-pay

## 2021-02-14 ENCOUNTER — Other Ambulatory Visit (HOSPITAL_COMMUNITY): Payer: Self-pay

## 2021-02-15 ENCOUNTER — Other Ambulatory Visit (HOSPITAL_COMMUNITY): Payer: Self-pay

## 2021-02-15 MED ORDER — GABAPENTIN 800 MG PO TABS
800.0000 mg | ORAL_TABLET | Freq: Two times a day (BID) | ORAL | 0 refills | Status: DC
  Filled 2021-02-15: qty 24, 12d supply, fill #0

## 2021-03-01 ENCOUNTER — Other Ambulatory Visit (HOSPITAL_COMMUNITY): Payer: Self-pay

## 2021-03-02 ENCOUNTER — Other Ambulatory Visit (HOSPITAL_COMMUNITY): Payer: Self-pay

## 2021-03-02 MED ORDER — GABAPENTIN 800 MG PO TABS
ORAL_TABLET | ORAL | 0 refills | Status: DC
  Filled 2021-03-02: qty 60, 12d supply, fill #0

## 2021-03-04 ENCOUNTER — Other Ambulatory Visit (HOSPITAL_COMMUNITY): Payer: Self-pay

## 2021-04-01 ENCOUNTER — Other Ambulatory Visit (HOSPITAL_COMMUNITY): Payer: Self-pay

## 2021-04-01 MED ORDER — GABAPENTIN 800 MG PO TABS
800.0000 mg | ORAL_TABLET | Freq: Two times a day (BID) | ORAL | 0 refills | Status: DC
  Filled 2021-04-01: qty 60, 30d supply, fill #0

## 2021-04-06 ENCOUNTER — Encounter: Payer: Self-pay | Admitting: Emergency Medicine

## 2021-04-06 ENCOUNTER — Other Ambulatory Visit: Payer: Self-pay

## 2021-04-06 ENCOUNTER — Ambulatory Visit
Admission: EM | Admit: 2021-04-06 | Discharge: 2021-04-06 | Disposition: A | Discharge status: Home / Self Care | Payer: Self-pay | Attending: Internal Medicine | Admitting: Internal Medicine

## 2021-04-06 ENCOUNTER — Other Ambulatory Visit (HOSPITAL_COMMUNITY): Payer: Self-pay

## 2021-04-06 DIAGNOSIS — K047 Periapical abscess without sinus: Secondary | ICD-10-CM

## 2021-04-06 MED ORDER — CLINDAMYCIN HCL 150 MG PO CAPS
300.0000 mg | ORAL_CAPSULE | Freq: Three times a day (TID) | ORAL | 0 refills | Status: AC
  Filled 2021-04-06: qty 42, 7d supply, fill #0

## 2021-04-06 NOTE — ED Provider Notes (Signed)
[facility]    CSN: [id number] Arrival date & time: 04/06/21  0932      History   Chief Complaint Chief Complaint  Patient presents with   Abscess    HPI Justin Burnett is a 55 y.o. male.   Patient presents with right upper mouth pain and swelling that has been present for approximately 2 weeks.  Patient reports that abscess has been draining on its own with purulent drainage.  Denies any fever, shortness of breath, throat swelling, sore throat, difficulty swallowing.  Patient reports that he has not seen a dentist and does not have a current established dentist.   Abscess  Past Medical History:  Diagnosis Date   Hypertension    Nerve damage    Neuropathy     Patient Active Problem List   Diagnosis Date Noted   Neuropathy     Past Surgical History:  Procedure Laterality Date   None         Home Medications    Prior to Admission medications   Medication Sig Start Date End Date Taking? Authorizing Provider  amLODipine (NORVASC) 10 MG tablet Take 1 tablet (10 mg total) by mouth daily. 11/05/20  Yes   clindamycin (CLEOCIN) 150 MG capsule Take 2 capsules (300 mg total) by mouth 3 (three) times daily for 7 days. 04/06/21 04/13/21 Yes Hazyl Marseille, Rolly Salter E, FNP  gabapentin (NEURONTIN) 800 MG tablet TAKE 1 TABLET BY MOUTH TWICE DAILY 12/23/18  Yes Bast, Traci A, NP  gabapentin (NEURONTIN) 800 MG tablet Take 1 tablet (800 mg total) by mouth 2 (two) times daily. 02/14/21  Yes   gabapentin (NEURONTIN) 800 MG tablet Take 1 tablet (800 mg total) by mouth 2 (two) times daily. 04/01/21  Yes   hydrochlorothiazide (HYDRODIURIL) 25 MG tablet Take 0.5 tablets (12.5 mg total) by mouth daily. 06/20/16   Bing Neighbors, FNP    Family History No family history on file.  Social History Social History   Tobacco Use   Smoking status: Every Day    Packs/day: 0.50    Years: 13.00    Pack years: 6.50    Types: Cigarettes   Smokeless tobacco: Never  Substance Use Topics    Alcohol use: Yes    Alcohol/week: 2.0 standard drinks    Types: 2 Standard drinks or equivalent per week    Comment: per week   Drug use: No     Allergies   Meperidine   Review of Systems Review of Systems Per HPI  Physical Exam Triage Vital Signs ED Triage Vitals  Enc Vitals Group     BP 04/06/21 1008 (!) 149/100     Pulse Rate 04/06/21 1008 92     Resp 04/06/21 1008 20     Temp 04/06/21 1008 98.3 F (36.8 C)     Temp Source 04/06/21 1008 Oral     SpO2 04/06/21 1008 97 %     Weight 04/06/21 1009 250 lb (113.4 kg)     Height 04/06/21 1009 6' (1.829 m)     Head Circumference --      Peak Flow --      Pain Score 04/06/21 1008 7     Pain Loc --      Pain Edu? --      Excl. in [place]? --    No data found.  Updated Vital Signs BP (!) 149/100 (BP Location: Left Arm)   Pulse 92   Temp 98.3 F (36.8 C) (Oral)  Resp 20   Ht 6' (1.829 m)   Wt 250 lb (113.4 kg)   SpO2 97%   BMI 33.91 kg/m   Visual Acuity Right Eye Distance:   Left Eye Distance:   Bilateral Distance:    Right Eye Near:   Left Eye Near:    Bilateral Near:     Physical Exam Constitutional:      General: He is not in acute distress.    Appearance: Normal appearance. He is not toxic-appearing or diaphoretic.  HENT:     Head: Normocephalic and atraumatic.     Mouth/Throat:     Lips: Pink.     Mouth: Mucous membranes are moist.     Dentition: Abnormal dentition. Does not have dentures. Dental tenderness, gingival swelling and dental abscesses present. No dental caries or gum lesions.     Pharynx: Oropharynx is clear. No pharyngeal swelling, oropharyngeal exudate or posterior oropharyngeal erythema.      Comments: Gingival swelling present to right upper portion of teeth and extends into palate of oral mucosa. Eyes:     Extraocular Movements: Extraocular movements intact.     Conjunctiva/sclera: Conjunctivae normal.  Pulmonary:     Effort: Pulmonary effort is normal.  Neurological:     General:  No focal deficit present.     Mental Status: He is alert and oriented to person, place, and time. Mental status is at baseline.  Psychiatric:        Mood and Affect: Mood normal.        Behavior: Behavior normal.        Thought Content: Thought content normal.        Judgment: Judgment normal.     UC Treatments / Results  Labs (all labs ordered are listed, but only abnormal results are displayed) Labs Reviewed - No data to display  EKG   Radiology No results found.  Procedures Procedures (including critical care time)  Medications Ordered in UC Medications - No data to display  Initial Impression / Assessment and Plan / UC Course  I have reviewed the triage vital signs and the nursing notes.  Pertinent labs & imaging results that were available during my care of the patient were reviewed by me and considered in my medical decision making (see chart for details).     Will treat dental abscess with clindamycin.  Advised patient to follow-up with dentist as soon as possible.  Patient to follow-up if no improvement in the next 24 to 48 hours.  No red flags seen on exam.Discussed strict return precautions. Patient verbalized understanding and is agreeable with plan.  Final Clinical Impressions(s) / UC Diagnoses   Final diagnoses:  Dental abscess     Discharge Instructions      You have been prescribed an antibiotic to treat infection of the mouth.  Please follow-up with a dentist as soon as possible for further evaluation and management.     ED Prescriptions     Medication Sig Dispense Auth. Provider   clindamycin (CLEOCIN) 150 MG capsule Take 2 capsules (300 mg total) by mouth 3 (three) times daily for 7 days. 42 capsule [place], Acie Fredricks[place], [place]      PDMP not reviewed this encounter.   Gustavus Bryant, [place] 04/06/21 1041

## 2021-04-06 NOTE — Discharge Instructions (Signed)
You have been prescribed an antibiotic to treat infection of the mouth.  Please follow-up with a dentist as soon as possible for further evaluation and management.

## 2021-04-06 NOTE — ED Triage Notes (Signed)
Patient c/o right upper mouth abscess x 2 weeks.  Patient does not have a Education officer, community.

## 2021-04-29 ENCOUNTER — Other Ambulatory Visit (HOSPITAL_COMMUNITY): Payer: Self-pay

## 2021-05-02 ENCOUNTER — Other Ambulatory Visit (HOSPITAL_COMMUNITY): Payer: Self-pay

## 2021-05-02 MED ORDER — GABAPENTIN 800 MG PO TABS
800.0000 mg | ORAL_TABLET | Freq: Two times a day (BID) | ORAL | 1 refills | Status: DC
  Filled 2021-05-02: qty 60, 30d supply, fill #0
  Filled 2021-06-03: qty 60, 30d supply, fill #1
  Filled 2021-07-02: qty 60, 30d supply, fill #2
  Filled 2021-07-30: qty 60, 30d supply, fill #3

## 2021-05-02 MED ORDER — CHLORHEXIDINE GLUCONATE 0.12 % MT SOLN
15.0000 mL | Freq: Two times a day (BID) | OROMUCOSAL | 0 refills | Status: DC
  Filled 2021-05-02: qty 473, 16d supply, fill #0

## 2021-05-02 MED ORDER — AMLODIPINE BESYLATE 10 MG PO TABS
10.0000 mg | ORAL_TABLET | Freq: Every day | ORAL | 0 refills | Status: DC
  Filled 2021-05-02 – 2021-05-22 (×2): qty 90, 90d supply, fill #0

## 2021-05-02 MED ORDER — AMOXICILLIN-POT CLAVULANATE 875-125 MG PO TABS
1.0000 | ORAL_TABLET | Freq: Two times a day (BID) | ORAL | 0 refills | Status: DC
  Filled 2021-05-02: qty 14, 7d supply, fill #0

## 2021-05-22 ENCOUNTER — Other Ambulatory Visit (HOSPITAL_COMMUNITY): Payer: Self-pay

## 2021-06-03 ENCOUNTER — Other Ambulatory Visit (HOSPITAL_COMMUNITY): Payer: Self-pay

## 2021-07-02 ENCOUNTER — Other Ambulatory Visit (HOSPITAL_COMMUNITY): Payer: Self-pay

## 2021-07-30 ENCOUNTER — Other Ambulatory Visit (HOSPITAL_COMMUNITY): Payer: Self-pay

## 2021-08-22 ENCOUNTER — Other Ambulatory Visit (HOSPITAL_COMMUNITY): Payer: Self-pay

## 2021-08-22 ENCOUNTER — Ambulatory Visit
Admission: EM | Admit: 2021-08-22 | Discharge: 2021-08-22 | Disposition: A | Discharge status: Home / Self Care | Payer: 59 | Attending: Physician Assistant | Admitting: Physician Assistant

## 2021-08-22 ENCOUNTER — Other Ambulatory Visit: Payer: Self-pay

## 2021-08-22 DIAGNOSIS — K089 Disorder of teeth and supporting structures, unspecified: Secondary | ICD-10-CM | POA: Diagnosis not present

## 2021-08-22 DIAGNOSIS — Z760 Encounter for issue of repeat prescription: Secondary | ICD-10-CM | POA: Diagnosis not present

## 2021-08-22 DIAGNOSIS — G609 Hereditary and idiopathic neuropathy, unspecified: Secondary | ICD-10-CM

## 2021-08-22 DIAGNOSIS — I10 Essential (primary) hypertension: Secondary | ICD-10-CM | POA: Diagnosis not present

## 2021-08-22 MED ORDER — CHLORHEXIDINE GLUCONATE 0.12 % MT SOLN
15.0000 mL | Freq: Two times a day (BID) | OROMUCOSAL | 0 refills | Status: DC
  Filled 2021-08-22: qty 473, 16d supply, fill #0

## 2021-08-22 MED ORDER — AMLODIPINE BESYLATE 10 MG PO TABS
10.0000 mg | ORAL_TABLET | Freq: Every day | ORAL | 0 refills | Status: DC
  Filled 2021-08-22: qty 90, 90d supply, fill #0

## 2021-08-22 MED ORDER — GABAPENTIN 800 MG PO TABS
800.0000 mg | ORAL_TABLET | Freq: Two times a day (BID) | ORAL | 0 refills | Status: DC
  Filled 2021-08-22: qty 180, 90d supply, fill #0

## 2021-08-22 NOTE — ED Provider Notes (Signed)
?[facility] ? ? ? ?CSN: [id number] ?Arrival date & time: 08/22/21  3474 ? ? ?  ? ?History   ?Chief Complaint ?No chief complaint on file. ? ? ?HPI ?Justin Burnett is a 56 y.o. male.  ? ?Patient here today for medication refill.  He reports that he needs a refill of his gabapentin, amlodipine, and requests refill of mouthwash that was prescribed to him in the past.  He does have history of dental abscess.  He reports that he has not had any side effects from medications except possible loose stools from gabapentin recently.  He does not currently have a primary care provider.  Denies any chest pain or shortness of breath.  He denies any lower extremity swelling. ? ?The history is provided by the patient.  ? ?Past Medical History:  ?Diagnosis Date  ? Hypertension   ? Nerve damage   ? Neuropathy   ? ? ?Patient Active Problem List  ? Diagnosis Date Noted  ? Neuropathy   ? ? ?Past Surgical History:  ?Procedure Laterality Date  ? None    ? ? ? ? ? ?Home Medications   ? ?Prior to Admission medications   ?Medication Sig Start Date End Date Taking? Authorizing Provider  ?amLODipine (NORVASC) 10 MG tablet Take 1 tablet (10 mg total) by mouth daily. 08/22/21   Tomi Bamberger, PA-C  ?amoxicillin-clavulanate (AUGMENTIN) 875-125 MG tablet Take 1 tablet by mouth every 12 (twelve) hours for 7 days 05/02/21     ?chlorhexidine (PERIDEX) 0.12 % solution Use as directed 15 mLs in the mouth or throat 2 (two) times daily. 08/22/21   Tomi Bamberger, PA-C  ?gabapentin (NEURONTIN) 800 MG tablet Take 1 tablet (800 mg total) by mouth 2 (two) times daily. 08/22/21   Tomi Bamberger, PA-C  ?hydrochlorothiazide (HYDRODIURIL) 25 MG tablet Take 0.5 tablets (12.5 mg total) by mouth daily. 06/20/16   Bing Neighbors, FNP  ? ? ?Family History ?History reviewed. No pertinent family history. ? ?Social History ?Social History  ? ?Tobacco Use  ? Smoking status: Every Day  ?  Packs/day: 0.50  ?  Years: 13.00  ?  Pack years: 6.50  ?  Types:  Cigarettes  ? Smokeless tobacco: Never  ?Substance Use Topics  ? Alcohol use: Yes  ?  Alcohol/week: 2.0 standard drinks  ?  Types: 2 Standard drinks or equivalent per week  ?  Comment: per week  ? Drug use: No  ? ? ? ?Allergies   ?Meperidine ? ? ?Review of Systems ?Review of Systems  ?Constitutional:  Negative for chills and fever.  ?Eyes:  Negative for discharge and redness.  ?Respiratory:  Negative for shortness of breath.   ?Cardiovascular:  Negative for chest pain.  ? ? ?Physical Exam ?Triage Vital Signs ?ED Triage Vitals [08/22/21 0815]  ?Enc Vitals Group  ?   BP (!) 144/89  ?   Pulse Rate 75  ?   Resp 16  ?   Temp 98.2 ?F (36.8 ?C)  ?   Temp Source Oral  ?   SpO2 100 %  ?   Weight   ?   Height   ?   Head Circumference   ?   Peak Flow   ?   Pain Score   ?   Pain Loc   ?   Pain Edu?   ?   Excl. in [place]?   ? ?No data found. ? ?Updated Vital Signs ?BP (!) 144/89  Pulse 75   Temp 98.2 ?F (36.8 ?C) (Oral)   Resp 16   SpO2 100%  ?   ? ?Physical Exam ?Vitals and nursing note reviewed.  ?Constitutional:   ?   General: He is not in acute distress. ?   Appearance: Normal appearance. He is not ill-appearing.  ?HENT:  ?   Head: Normocephalic and atraumatic.  ?Eyes:  ?   Conjunctiva/sclera: Conjunctivae normal.  ?Cardiovascular:  ?   Rate and Rhythm: Normal rate and regular rhythm.  ?   Heart sounds: Normal heart sounds. No murmur heard. ?Pulmonary:  ?   Effort: Pulmonary effort is normal. No respiratory distress.  ?   Breath sounds: Normal breath sounds. No wheezing, rhonchi or rales.  ?Musculoskeletal:  ?   Right lower leg: No edema.  ?   Left lower leg: No edema.  ?Neurological:  ?   Mental Status: He is alert.  ?Psychiatric:     ?   Mood and Affect: Mood normal.     ?   Behavior: Behavior normal.     ?   Thought Content: Thought content normal.  ? ? ? ?UC Treatments / Results  ?Labs ?(all labs ordered are listed, but only abnormal results are displayed) ?Labs Reviewed - No data to display ? ?EKG ? ? ?Radiology ?No  results found. ? ?Procedures ?Procedures (including critical care time) ? ?Medications Ordered in UC ?Medications - No data to display ? ?Initial Impression / Assessment and Plan / UC Course  ?I have reviewed the triage vital signs and the nursing notes. ? ?Pertinent labs & imaging results that were available during my care of the patient were reviewed by me and considered in my medical decision making (see chart for details). ? ?Refills sent to pharmacy as requested.  Patient was walked next door to schedule appointment to establish care with PCP. Encouraged follow up with any further concerns.  ? ? ?Final Clinical Impressions(s) / UC Diagnoses  ? ?Final diagnoses:  ?Encounter for medication refill  ?Essential hypertension  ?Poor dentition  ? ?Discharge Instructions   ?None ?  ? ?ED Prescriptions   ? ? Medication Sig Dispense Auth. Provider  ? amLODipine (NORVASC) 10 MG tablet Take 1 tablet (10 mg total) by mouth daily. 90 tablet Erma Pinto F, PA-C  ? chlorhexidine (PERIDEX) 0.12 % solution Use as directed 15 mLs in the mouth or throat 2 (two) times daily. 473 mL Tomi Bamberger, PA-C  ? gabapentin (NEURONTIN) 800 MG tablet Take 1 tablet (800 mg total) by mouth 2 (two) times daily. 180 tablet Tomi Bamberger, PA-C  ? ?  ? ?PDMP not reviewed this encounter. ?  ?Tomi Bamberger, PA-C ?08/22/21 [phone] ? ?

## 2021-08-22 NOTE — ED Triage Notes (Signed)
Pt presents to [facility] for med refill on amlodipine and gabapentin. ?

## 2021-08-26 ENCOUNTER — Other Ambulatory Visit (HOSPITAL_COMMUNITY): Payer: Self-pay

## 2021-09-23 NOTE — Progress Notes (Addendum)
?Subjective:  ? ? Justin Burnett - 56 y.o. male MRN [id number]  Date of birth: [birth year] ? ?HPI ? ?Justin Burnett is to establish care.  ? ?Current issues and/or concerns: ?Appointment 08/22/2021 at [facility]. Amlodipine, Gabapentin, and Peridex refilled at that time. ? ?Reports does not monitor blood pressure at home but has home monitor to do so. Taking Amlodipine as prescribed. Reports adding salt to his food. Exercises outside of his normal routine. Denies shortness of breath and chest pain. 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 Reports followed by Orthopedics in the past for concerns of bilateral elbows. Surgery suggested at that time. Recently bilateral hands with numbness and thinks related to elbows.  ?  ? ?ROS per HPI  ? ? ?Health Maintenance:  ?Health Maintenance Due  ?Topic Date Due  ? COVID-19 Vaccine (1) Never done  ? HIV Screening  Never done  ? Hepatitis C Screening  Never done  ? COLONOSCOPY (Pts 45-19yrs Insurance coverage will need to be confirmed)  Never done  ? Zoster Vaccines- Shingrix (1 of 2) Never done  ? ? ? ?Past Medical History: ?Patient Active Problem List  ? Diagnosis Date Noted  ? Essential (primary) hypertension 09/26/2021  ? Neuropathy   ? Osteoarthritis 11/03/2011  ? ? ?Social History  ? reports that he has been smoking cigarettes. He has a 6.50 pack-year smoking history. He has never used smokeless tobacco. He reports current alcohol use of about 2.0 standard drinks per week. He reports that he does not use drugs.  ? ?Family History  ?family history is not on file.  ? ?Medications: reviewed and updated ?  ?Objective:  ? Physical Exam ?BP 130/80 (BP Location: Left Arm, Patient Position: Sitting, Cuff Size: Large)   Pulse 78   Temp 98.3 ?F (36.8 ?C)   Resp 18   Ht 6' 0.44" (1.84 m)   Wt 240 lb (108.9 kg)   SpO2 98%   BMI 32.15 kg/m?  ? ?Physical Exam ?HENT:  ?   Head: Normocephalic and atraumatic.  ?Eyes:  ?   Extraocular  Movements: Extraocular movements intact.  ?   Conjunctiva/sclera: Conjunctivae normal.  ?   Pupils: Pupils are equal, round, and reactive to light.  ?Cardiovascular:  ?   Rate and Rhythm: Normal rate and regular rhythm.  ?   Pulses: Normal pulses.  ?   Heart sounds: Normal heart sounds.  ?Pulmonary:  ?   Effort: Pulmonary effort is normal.  ?   Breath sounds: Normal breath sounds.  ?Musculoskeletal:  ?   Cervical back: Normal range of motion and neck supple.  ?Neurological:  ?   General: No focal deficit present.  ?   Mental Status: He is alert and oriented to person, place, and time.  ?Psychiatric:     ?   Mood and Affect: Mood normal.     ?   Behavior: Behavior normal.  ? ? ?Assessment & Plan:  ?1. Encounter to establish care: ?- Patient presents today to establish care.  ?- Return for annual physical examination, labs, and health maintenance. Arrive fasting meaning having no food for at least 8 hours prior to appointment. You may have only water or black coffee. Please take scheduled medications as normal. ? ?2. Essential (primary) hypertension: ?- Continue Amlodipine as prescribed. No refills needed as of present. ?- Counseled on blood pressure goal of less than 130/80, low-sodium, DASH diet, medication compliance, and 150 minutes of moderate intensity exercise per  week as tolerated. Counseled on medication adherence and adverse effects. ?- BMP to evaluate kidney function and electrolyte balance. ?- Follow-up with primary provider in 4 months or sooner if needed.  ?- Basic Metabolic Panel ? ?3. Neuropathy involving both lower extremities: ?- Continue Gabapentin as prescribed. No refills needed as of present.  ?- Follow-up with primary provider as scheduled.  ? ?4. Chronic elbow pain, unspecified laterality: ?- Per patient request referral to Orthopedic Surgery for further evaluation and management.  ?- Ambulatory referral to Orthopedic Surgery ? ? ?Patient was given clear instructions to go to Emergency  Department or return to medical center if symptoms don't improve, worsen, or new problems develop.The patient verbalized understanding. ? ?I discussed the assessment and treatment plan with the patient. The patient was provided an opportunity to ask questions and all were answered. The patient agreed with the plan and demonstrated an understanding of the instructions. ?  ?The patient was advised to call back or seek an in-person evaluation if the symptoms worsen or if the condition fails to improve as anticipated. ? ? ? ?Ricky Stabs, NP ?09/26/2021, 12:45 PM ?Primary Care at [facility]  ? ?

## 2021-09-25 ENCOUNTER — Ambulatory Visit: Payer: 59 | Admitting: Nurse Practitioner

## 2021-09-26 ENCOUNTER — Encounter: Payer: 59 | Admitting: Family

## 2021-09-26 ENCOUNTER — Other Ambulatory Visit: Payer: Self-pay

## 2021-09-26 ENCOUNTER — Encounter: Payer: Self-pay | Admitting: Family

## 2021-09-26 VITALS — BP 130/80 | HR 78 | Temp 98.3°F | Resp 18 | Ht 72.44 in | Wt 240.0 lb

## 2021-09-26 DIAGNOSIS — I10 Essential (primary) hypertension: Secondary | ICD-10-CM

## 2021-09-26 DIAGNOSIS — G8929 Other chronic pain: Secondary | ICD-10-CM

## 2021-09-26 DIAGNOSIS — G5793 Unspecified mononeuropathy of bilateral lower limbs: Secondary | ICD-10-CM

## 2021-09-26 DIAGNOSIS — Z7689 Persons encountering health services in other specified circumstances: Secondary | ICD-10-CM

## 2021-09-26 HISTORY — DX: Essential (primary) hypertension: I10

## 2021-09-26 NOTE — Progress Notes (Signed)
Pt presents to establish care 

## 2021-09-26 NOTE — Addendum Note (Signed)
Addended by: Rema Fendt on: 09/26/2021 12:51 PM ? ? Modules accepted: Orders, Level of Service ? ?

## 2021-09-26 NOTE — Addendum Note (Signed)
Addended by: Margorie John on: 09/26/2021 10:30 AM ? ? Modules accepted: Orders ? ?

## 2021-09-26 NOTE — Addendum Note (Signed)
Addended by: Margorie John on: 09/26/2021 10:37 AM ? ? Modules accepted: Orders ? ?

## 2021-09-26 NOTE — Addendum Note (Signed)
Addended by: Elmon Else on: 09/26/2021 04:08 PM ? ? Modules accepted: Orders ? ?

## 2021-09-26 NOTE — Patient Instructions (Signed)
Thank you for choosing Primary Care at [facility] for your medical home!   ? ?[name] was seen by Rema Fendt, NP today.  ? ?Florian Buff primary care provider is Rema Fendt, NP.   ?For the best care possible,  you should try to see Ricky Stabs, NP ?whenever you come to office.  ? ?We look forward to seeing you again soon! ? ?If you have any questions about your visit today,  ?please call us at [phone] ? ?Or feel free to reach your provider via MyChart.   ? ?Keeping you healthy ?  ?Get these tests ?Blood pressure- Have your blood pressure checked once a year by your healthcare provider.  Normal blood pressure is 120/80. ?Weight- Have your body mass index (BMI) calculated to screen for obesity.  BMI is a measure of body fat based on height and weight. You can also calculate your own BMI at https://www.west-esparza.com/. ?Cholesterol- Have your cholesterol checked regularly starting at age 31, sooner may be necessary if you have diabetes, high blood pressure, if a family member developed heart diseases at an early age or if you smoke.  ?Chlamydia, HIV, and other sexual transmitted disease- Get screened each year until the age of 61 then within three months of each new sexual partner. ?Diabetes- Have your blood sugar checked regularly if you have high blood pressure, high cholesterol, a family history of diabetes or if you are overweight. ?  ?Get these vaccines ?Flu shot- Every fall. ?Tetanus shot- Every 10 years. ?Menactra- Single dose; prevents meningitis. ?  ?Take these steps ?Don't smoke- If you do smoke, ask your healthcare provider about quitting. For tips on how to quit, go to www.smokefree.gov or call 1-800-QUIT-NOW. ?Be physically active- Exercise 5 days a week for at least 30 minutes.  If you are not already physically active start slow and gradually work up to 30 minutes of moderate physical activity.  Examples of moderate activity include walking briskly, mowing the yard, dancing,  swimming bicycling, etc. ?Eat a healthy diet- Eat a variety of healthy foods such as fruits, vegetables, low fat milk, low fat cheese, yogurt, lean meats, poultry, fish, beans, tofu, etc.  For more information on healthy eating, go to www.thenutritionsource.org ?Drink alcohol in moderation- Limit alcohol intake two drinks or less a day.  Never drink and drive. ?Dentist- Brush and floss teeth twice daily; visit your dentis twice a year. ?Depression-Your emotional health is as important as your physical health.  If you're feeling down, losing interest in things you normally enjoy please talk with your healthcare provider. ?Arboriculturist- If you keep a gun in your home, keep it unloaded and with the safety lock on.  Bullets should be stored separately. ?Helmet use- Always wear a helmet when riding a motorcycle, bicycle, rollerblading or skateboarding. ?Safe sex- If you may be exposed to a sexually transmitted infection, use a condom ?Seat belts- Seat bels can save your life; always wear one. ?Smoke/Carbon Monoxide detectors- These detectors need to be installed on the appropriate level of your home.  Replace batteries at least once a year. ?Skin Cancer- When out in the sun, cover up and use sunscreen SPF 15 or higher. ?Violence- If anyone is threatening or hurting you, please tell your healthcare provider. ? ?

## 2021-09-26 NOTE — Addendum Note (Signed)
Addended by: Margorie John on: 09/26/2021 04:07 PM ? ? Modules accepted: Orders ? ?

## 2021-09-27 LAB — BASIC METABOLIC PANEL
BUN/Creatinine Ratio: 12 (ref 9–20)
BUN: 10 mg/dL (ref 6–24)
CO2: 22 mmol/L (ref 20–29)
Calcium: 9.4 mg/dL (ref 8.7–10.2)
Chloride: 101 mmol/L (ref 96–106)
Creatinine, Ser: 0.85 mg/dL (ref 0.76–1.27)
Glucose: 92 mg/dL (ref 70–99)
Potassium: 4.1 mmol/L (ref 3.5–5.2)
Sodium: 139 mmol/L (ref 134–144)
eGFR: 101 mL/min/{1.73_m2} (ref >59)

## 2021-09-27 LAB — SPECIMEN STATUS REPORT

## 2021-09-27 NOTE — Progress Notes (Signed)
Call patient with update.  ? ?Kidney function and electrolytes normal.

## 2021-10-03 ENCOUNTER — Ambulatory Visit: Payer: 59 | Admitting: Orthopedic Surgery

## 2021-10-11 NOTE — Progress Notes (Signed)
? ? ?Patient ID: Justin Burnett, male    DOB: February 18, 1966  MRN: [id number] ? ?CC: Annual Physical Exam ? ?Subjective: ?Justin Burnett is a 56 y.o. male who presents for annual physical exam.  ? ?His concerns today include:  ?None. ? ?Patient Active Problem List  ? Diagnosis Date Noted  ? Essential (primary) hypertension 09/26/2021  ? Neuropathy   ? Osteoarthritis 11/03/2011  ?  ? ?Current Outpatient Medications on File Prior to Visit  ?Medication Sig Dispense Refill  ? amLODipine (NORVASC) 10 MG tablet Take 1 tablet (10 mg total) by mouth daily. 90 tablet 0  ? chlorhexidine (PERIDEX) 0.12 % solution Use as directed 15 mLs in the mouth or throat 2 (two) times daily. 473 mL 0  ? gabapentin (NEURONTIN) 800 MG tablet Take 1 tablet (800 mg total) by mouth 2 (two) times daily. 180 tablet 0  ? ?No current facility-administered medications on file prior to visit.  ? ? ?Allergies  ?Allergen Reactions  ? Meperidine Other (See Comments)  ?  Causes seizure-like activities  ? ? ?Social History  ? ?Socioeconomic History  ? Marital status: Married  ?  Spouse name: [name]   ? Number of children: 1  ? Years of education: GED  ? Highest education level: Not on file  ?Occupational History  ? Not on file  ?Tobacco Use  ? Smoking status: Every Day  ?  Packs/day: 0.50  ?  Years: 13.00  ?  Pack years: 6.50  ?  Types: Cigarettes  ?  Passive exposure: Current  ? Smokeless tobacco: Never  ?Substance and Sexual Activity  ? Alcohol use: Yes  ?  Alcohol/week: 2.0 standard drinks  ?  Types: 2 Standard drinks or equivalent per week  ?  Comment: per week  ? Drug use: No  ? Sexual activity: Not Currently  ?Other Topics Concern  ? Not on file  ?Social History Narrative  ? Patient lives at home with his wife Environmental education officer).  ? Patient is unemployed  ? Education GED and [employer].  ? Right handed.   ? Caffeine coffee and mountain dew.  ? ?Social Determinants of Health  ? ?Financial Resource Strain: Not on file  ?Food Insecurity: Not on file  ?Transportation  Needs: Not on file  ?Physical Activity: Not on file  ?Stress: Not on file  ?Social Connections: Not on file  ?Intimate Partner Violence: Not on file  ? ? ?No family history on file. ? ?Past Surgical History:  ?Procedure Laterality Date  ? None    ? ? ?ROS: ?Review of Systems ?Negative except as stated above ? ?PHYSICAL EXAM: ?BP 128/84 (BP Location: Left Arm, Patient Position: Sitting, Cuff Size: Large)   Pulse 75   Temp 98.3 ?F (36.8 ?C)   Resp 18   Ht 6' 0.44" (1.84 m)   Wt 240 lb (108.9 kg)   SpO2 98%   BMI 32.15 kg/m?  ? ?Physical Exam ?HENT:  ?   Head: Normocephalic and atraumatic.  ?   Right Ear: Tympanic membrane, ear canal and external ear normal.  ?   Left Ear: Tympanic membrane, ear canal and external ear normal.  ?   Nose: Nose normal.  ?   Mouth/Throat:  ?   Mouth: Mucous membranes are moist.  ?   Pharynx: Oropharynx is clear.  ?Eyes:  ?   Extraocular Movements: Extraocular movements intact.  ?   Conjunctiva/sclera: Conjunctivae normal.  ?   Pupils: Pupils are equal, round, and reactive to light.  ?  Cardiovascular:  ?   Rate and Rhythm: Normal rate and regular rhythm.  ?   Pulses: Normal pulses.  ?   Heart sounds: Normal heart sounds.  ?Pulmonary:  ?   Effort: Pulmonary effort is normal.  ?   Breath sounds: Normal breath sounds.  ?Abdominal:  ?   General: Bowel sounds are normal.  ?   Palpations: Abdomen is soft.  ?Genitourinary: ?   Comments: Patient declined exam.  ?Musculoskeletal:     ?   General: Normal range of motion.  ?   Cervical back: Normal range of motion and neck supple.  ?Skin: ?   General: Skin is warm and dry.  ?   Capillary Refill: Capillary refill takes less than 2 seconds.  ?Neurological:  ?   General: No focal deficit present.  ?   Mental Status: He is alert and oriented to person, place, and time.  ?Psychiatric:     ?   Mood and Affect: Mood normal.     ?   Behavior: Behavior normal.  ? ?ASSESSMENT AND PLAN: ?1. Annual physical exam: ?- Counseled on 150 minutes of exercise per  week as tolerated, healthy eating (including decreased daily intake of saturated fats, cholesterol, added sugars, sodium), STI prevention, and routine healthcare maintenance. ? ?2. Screening for metabolic disorder: ?- Screening liver function.  ?- Hepatic Function Panel ? ?3. Screening for deficiency anemia: ?- CBC to screen for anemia. ?- CBC ? ?4. Diabetes mellitus screening: ?- Hemoglobin A1c to screen for pre-diabetes/diabetes. ?- Hemoglobin A1c ? ?5. Screening cholesterol level: ?- Lipid panel to screen for high cholesterol.  ?- Lipid panel ? ?6. Thyroid disorder screen: ?- TSH to check thyroid function.  ?- TSH ? ?7. Need for hepatitis C screening test: ?- Hepatitis C antibody to screen for hepatitis C.  ?- Hepatitis C Antibody ? ?8. Encounter for screening for HIV: ?- HIV antibody to screen for human immunodeficiency virus.  ?- HIV antibody (with reflex) ? ?9. Colon cancer screening: ?- Cologuard for colon cancer screening.  ?- Cologuard ? ? ? ?Patient was given the opportunity to ask questions.  Patient verbalized understanding of the plan and was able to repeat key elements of the plan. Patient was given clear instructions to go to Emergency Department or return to medical center if symptoms don't improve, worsen, or new problems develop.The patient verbalized understanding. ? ? ?Orders Placed This Encounter  ?Procedures  ? HIV antibody (with reflex)  ? Hepatitis C Antibody  ? Hepatic Function Panel  ? CBC  ? Lipid panel  ? TSH  ? Hemoglobin A1c  ? Cologuard  ? ? ? ?Return in about 1 year (around 10/17/2022) for Physical per patient preference. ? ?Rema Fendt, NP  ?

## 2021-10-16 ENCOUNTER — Encounter: Payer: Self-pay | Admitting: Family

## 2021-10-16 ENCOUNTER — Ambulatory Visit (INDEPENDENT_AMBULATORY_CARE_PROVIDER_SITE_OTHER): Payer: 59 | Admitting: Family

## 2021-10-16 VITALS — BP 128/84 | HR 75 | Temp 98.3°F | Resp 18 | Ht 72.44 in | Wt 240.0 lb

## 2021-10-16 DIAGNOSIS — Z13220 Encounter for screening for lipoid disorders: Secondary | ICD-10-CM

## 2021-10-16 DIAGNOSIS — Z0000 Encounter for general adult medical examination without abnormal findings: Secondary | ICD-10-CM | POA: Diagnosis not present

## 2021-10-16 DIAGNOSIS — Z13228 Encounter for screening for other metabolic disorders: Secondary | ICD-10-CM | POA: Diagnosis not present

## 2021-10-16 DIAGNOSIS — Z130 Encounter for screening for diseases of the blood and blood-forming organs and certain disorders involving the immune mechanism: Secondary | ICD-10-CM

## 2021-10-16 DIAGNOSIS — Z131 Encounter for screening for diabetes mellitus: Secondary | ICD-10-CM | POA: Diagnosis not present

## 2021-10-16 DIAGNOSIS — Z1159 Encounter for screening for other viral diseases: Secondary | ICD-10-CM

## 2021-10-16 DIAGNOSIS — Z114 Encounter for screening for human immunodeficiency virus [HIV]: Secondary | ICD-10-CM

## 2021-10-16 DIAGNOSIS — Z1329 Encounter for screening for other suspected endocrine disorder: Secondary | ICD-10-CM

## 2021-10-16 DIAGNOSIS — Z1211 Encounter for screening for malignant neoplasm of colon: Secondary | ICD-10-CM

## 2021-10-16 NOTE — Progress Notes (Signed)
Pt present for annual physical exam  ?Wants to complete Cologuard for colon cancer screening  ?

## 2021-10-16 NOTE — Patient Instructions (Signed)

## 2021-10-17 ENCOUNTER — Other Ambulatory Visit: Payer: Self-pay | Admitting: Family

## 2021-10-17 DIAGNOSIS — R7303 Prediabetes: Secondary | ICD-10-CM

## 2021-10-17 DIAGNOSIS — E785 Hyperlipidemia, unspecified: Secondary | ICD-10-CM

## 2021-10-17 HISTORY — DX: Hyperlipidemia, unspecified: E78.5

## 2021-10-17 HISTORY — DX: Prediabetes: R73.03

## 2021-10-17 LAB — HEPATIC FUNCTION PANEL
ALT: 28 IU/L (ref 0–44)
AST: 29 IU/L (ref 0–40)
Albumin: 4.5 g/dL (ref 3.8–4.9)
Alkaline Phosphatase: 91 IU/L (ref 44–121)
Bilirubin Total: 0.3 mg/dL (ref 0.0–1.2)
Bilirubin, Direct: 0.13 mg/dL (ref 0.00–0.40)
Total Protein: 7.4 g/dL (ref 6.0–8.5)

## 2021-10-17 LAB — HIV ANTIBODY (ROUTINE TESTING W REFLEX): HIV Screen 4th Generation wRfx: NONREACTIVE

## 2021-10-17 LAB — CBC
Hematocrit: 45.8 % (ref 37.5–51.0)
Hemoglobin: 16.5 g/dL (ref 13.0–17.7)
MCH: 34.7 pg — ABNORMAL HIGH (ref 26.6–33.0)
MCHC: 36 g/dL — ABNORMAL HIGH (ref 31.5–35.7)
MCV: 96 fL (ref 79–97)
Platelets: 168 10*3/uL (ref 150–450)
RBC: 4.76 x10E6/uL (ref 4.14–5.80)
RDW: 12.8 % (ref 11.6–15.4)
WBC: 4.1 10*3/uL (ref 3.4–10.8)

## 2021-10-17 LAB — LIPID PANEL
Chol/HDL Ratio: 4.5 ratio (ref 0.0–5.0)
Cholesterol, Total: 247 mg/dL — ABNORMAL HIGH (ref 100–199)
HDL: 55 mg/dL (ref >39)
LDL Chol Calc (NIH): 135 mg/dL — ABNORMAL HIGH (ref 0–99)
Triglycerides: 317 mg/dL — ABNORMAL HIGH (ref 0–149)
VLDL Cholesterol Cal: 57 mg/dL — ABNORMAL HIGH (ref 5–40)

## 2021-10-17 LAB — HEMOGLOBIN A1C
Est. average glucose Bld gHb Est-mCnc: 117 mg/dL
Hgb A1c MFr Bld: 5.7 % — ABNORMAL HIGH (ref 4.8–5.6)

## 2021-10-17 LAB — TSH: TSH: 1.92 u[IU]/mL (ref 0.450–4.500)

## 2021-10-17 LAB — HEPATITIS C ANTIBODY: Hep C Virus Ab: NONREACTIVE

## 2021-10-17 MED ORDER — ATORVASTATIN CALCIUM 20 MG PO TABS: 20.0000 mg | ORAL_TABLET | Freq: Every day | ORAL | 1 refills | Status: DC

## 2021-10-17 NOTE — Progress Notes (Signed)
-    Liver function normal. ?-  Thyroid function normal. ?-  No anemia.  ?-  Hepatitis C negative. ?-  HIV negative.  ? ?The following abnormalities are noted:   ?-  Cholesterol higher than expected. High cholesterol may increase risk of heart attack and/or stroke. Consider eating more fruits, vegetables, and lean baked meats such as chicken or fish. Moderate intensity exercise at least 150 minutes as tolerated per week may help as well.  ?-  Hemoglobin A1c is consistent with prediabetes. Practice healthy eating habits of fresh fruit and vegetables, lean baked meats such as chicken, fish, and [place]; limit breads, rice, pastas, and desserts; practice regular aerobic exercise (at least 150 minutes a week as tolerated). ? ?All other values are normal, stable or within acceptable limits. ? ?Medication changes / Follow up labs / Other changes or recommendations:   ?-  Begin Atorvastatin for high cholesterol. Recheck fasting cholesterol in 6 to 8 weeks.  ?-  No medication needed as of present for prediabetes. Encouraged to recheck in 6 months. ? ?Rema Fendt, NP 10/17/2021 11:32 AM  ?

## 2021-10-22 ENCOUNTER — Other Ambulatory Visit: Payer: 59

## 2021-11-09 LAB — COLOGUARD: COLOGUARD: POSITIVE — AB

## 2021-11-12 ENCOUNTER — Other Ambulatory Visit: Payer: Self-pay | Admitting: Family

## 2021-11-12 DIAGNOSIS — R195 Other fecal abnormalities: Secondary | ICD-10-CM

## 2021-11-12 DIAGNOSIS — Z1211 Encounter for screening for malignant neoplasm of colon: Secondary | ICD-10-CM

## 2021-11-12 NOTE — Progress Notes (Signed)
Cologuard positive. Referral to Gastroenterology for further evaluation and management. Expect a call within 2 weeks with appointment details.

## 2021-11-22 ENCOUNTER — Encounter: Payer: Self-pay | Admitting: Family

## 2021-11-22 ENCOUNTER — Ambulatory Visit (INDEPENDENT_AMBULATORY_CARE_PROVIDER_SITE_OTHER): Payer: 59 | Admitting: Family

## 2021-11-22 ENCOUNTER — Other Ambulatory Visit (HOSPITAL_COMMUNITY): Payer: Self-pay

## 2021-11-22 ENCOUNTER — Other Ambulatory Visit: Payer: Self-pay

## 2021-11-22 VITALS — BP 147/70 | HR 93 | Temp 98.3°F | Resp 18 | Ht 72.44 in | Wt 240.0 lb

## 2021-11-22 DIAGNOSIS — G629 Polyneuropathy, unspecified: Secondary | ICD-10-CM

## 2021-11-22 DIAGNOSIS — G609 Hereditary and idiopathic neuropathy, unspecified: Secondary | ICD-10-CM | POA: Diagnosis not present

## 2021-11-22 DIAGNOSIS — I10 Essential (primary) hypertension: Secondary | ICD-10-CM | POA: Diagnosis not present

## 2021-11-22 DIAGNOSIS — Z114 Encounter for screening for human immunodeficiency virus [HIV]: Secondary | ICD-10-CM

## 2021-11-22 MED ORDER — GABAPENTIN 800 MG PO TABS
800.0000 mg | ORAL_TABLET | Freq: Two times a day (BID) | ORAL | 2 refills | Status: DC
  Filled 2021-11-22: qty 60, 30d supply, fill #0
  Filled 2021-12-19 – 2021-12-20 (×2): qty 60, 30d supply, fill #1

## 2021-11-22 MED ORDER — AMLODIPINE BESYLATE 10 MG PO TABS
10.0000 mg | ORAL_TABLET | Freq: Every day | ORAL | 0 refills | Status: DC
  Filled 2021-11-22: qty 90, 90d supply, fill #0

## 2021-11-22 NOTE — Progress Notes (Signed)
Pt presents for medication problem, states for about 1 month now when he takes Gabapentin 30 min later he has bowel movements and it always happens

## 2021-11-22 NOTE — Progress Notes (Signed)
Patient ID: ERIL TIMPSON, male    DOB: 10/22/1965  MRN: [id number]  CC: Medication Problem (gabapentin)   Subjective: Justin Burnett is a 56 y.o. male who presents for medication problem (gabapentin).   His concerns today include:  Reports Gabapentin causing bowel movements within 30 minutes after taking. Began about 2 months ago. He is taking the Gabapentin around 6:00 am and then again at 1:30 pm. He also has breakfast and lunch during those times. Reports bowel movements are his normal consistency, color, odor, no blood. 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 Considered if trying a new medication would be better. Reports neuropathy of his legs is manageable with the current 800 mg twice daily regimen. He has been taking Gabapentin since 2014 without any issues prior to recently.   Patient Active Problem List   Diagnosis Date Noted   Hyperlipidemia 10/17/2021   Prediabetes 10/17/2021   Essential (primary) hypertension 09/26/2021   Neuropathy    Osteoarthritis 11/03/2011     Current Outpatient Medications on File Prior to Visit  Medication Sig Dispense Refill   amLODipine (NORVASC) 10 MG tablet Take 1 tablet (10 mg total) by mouth daily. 90 tablet 0   atorvastatin (LIPITOR) 20 MG tablet Take 1 tablet (20 mg total) by mouth daily. 90 tablet 1   chlorhexidine (PERIDEX) 0.12 % solution Use as directed 15 mLs in the mouth or throat 2 (two) times daily. 473 mL 0   No current facility-administered medications on file prior to visit.    Allergies  Allergen Reactions   Meperidine Other (See Comments)    Causes seizure-like activities    Social History   Socioeconomic History   Marital status: Married    Spouse name: [name]    Number of children: 1   Years of education: GED   Highest education level: Not on file  Occupational History   Not on file  Tobacco Use   Smoking status: Every Day    Packs/day: 0.50    Years:  13.00    Total pack years: 6.50    Types: Cigarettes    Passive exposure: Current   Smokeless tobacco: Never  Substance and Sexual Activity   Alcohol use: Yes    Alcohol/week: 2.0 standard drinks of alcohol    Types: 2 Standard drinks or equivalent per week    Comment: per week   Drug use: No   Sexual activity: Not Currently  Other Topics Concern   Not on file  Social History Narrative   Patient lives at home with his wife ([name]).   Patient is unemployed   Engineer, drilling. School.   Right handed.    Caffeine coffee and mountain dew.   Social Determinants of Health   Financial Resource Strain: Not on file  Food Insecurity: Not on file  Transportation Needs: Not on file  Physical Activity: Not on file  Stress: Not on file  Social Connections: Not on file  Intimate Partner Violence: Not on file    No family history on file.  Past Surgical History:  Procedure Laterality Date   None      ROS: Review of Systems Negative except as stated above  PHYSICAL EXAM: BP (!) 147/70 (BP Location: Left Arm, Patient Position: Sitting, Cuff Size: Large)   Pulse 93   Temp 98.3 F (36.8 C)   Resp 18   Ht 6' 0.44" (1.84 m)   Wt  240 lb (108.9 kg)   SpO2 97%   BMI 32.15 kg/m   Physical Exam HENT:     Head: Normocephalic and atraumatic.  Eyes:     Extraocular Movements: Extraocular movements intact.     Conjunctiva/sclera: Conjunctivae normal.     Pupils: Pupils are equal, round, and reactive to light.  Cardiovascular:     Rate and Rhythm: Normal rate and regular rhythm.     Pulses: Normal pulses.     Heart sounds: Normal heart sounds.  Pulmonary:     Effort: Pulmonary effort is normal.     Breath sounds: Normal breath sounds.  Musculoskeletal:     Cervical back: Normal range of motion and neck supple.  Neurological:     General: No focal deficit present.     Mental Status: He is alert and oriented to person, place, and time.  Psychiatric:        Mood and  Affect: Mood normal.        Behavior: Behavior normal.    ASSESSMENT AND PLAN: 1. Essential (primary) hypertension - Continue Amlodipine as prescribed. Refilled today 11/22/2021 prior to appointment for 90 day supply. - Counseled on blood pressure goal of less than 130/80, low-sodium, DASH diet, medication compliance, and 150 minutes of moderate intensity exercise per week as tolerated. Counseled on medication adherence and adverse effects. - Follow-up with primary provider in 3 months or sooner if needed.   2. Neuropathy 3. Hereditary and idiopathic peripheral neuropathy - Discussed benefits of remaining on current dosage with tolerable pain level versus discontinuing medication and starting a new medication at significantly lower dose which may increase pain level. Discussed patient try to take medication 15 to 30 minutes prior to his meals so that he has extra time to have a bowel movement before he leaves for work or has to return to work from his lunch breaks. Patient agreeable to trying this regimen.  - Continue Gabapentin as prescribed.  - Referral to Neurology for further evaluation and management.  - gabapentin (NEURONTIN) 800 MG tablet; Take 1 tablet (800 mg total) by mouth 2 (two) times daily.  Dispense: 60 tablet; Refill: 2 - Ambulatory referral to Neurology   Patient was given the opportunity to ask questions.  Patient verbalized understanding of the plan and was able to repeat key elements of the plan. Patient was given clear instructions to go to Emergency Department or return to medical center if symptoms don't improve, worsen, or new problems develop.The patient verbalized understanding.   Orders Placed This Encounter  Procedures   Ambulatory referral to Neurology     Requested Prescriptions   Signed Prescriptions Disp Refills   gabapentin (NEURONTIN) 800 MG tablet 60 tablet 2    Sig: Take 1 tablet (800 mg total) by mouth 2 (two) times daily.    Follow-up with  primary provider as scheduled.  Camillia Herter, NP

## 2021-11-27 ENCOUNTER — Encounter: Payer: Self-pay | Admitting: Internal Medicine

## 2021-11-27 ENCOUNTER — Telehealth: Payer: Self-pay | Admitting: Internal Medicine

## 2021-12-05 ENCOUNTER — Other Ambulatory Visit (HOSPITAL_COMMUNITY): Payer: Self-pay

## 2021-12-05 ENCOUNTER — Ambulatory Visit (AMBULATORY_SURGERY_CENTER): Payer: Self-pay

## 2021-12-05 ENCOUNTER — Other Ambulatory Visit: Payer: Self-pay

## 2021-12-05 VITALS — Ht 72.0 in | Wt 239.6 lb

## 2021-12-05 DIAGNOSIS — R195 Other fecal abnormalities: Secondary | ICD-10-CM

## 2021-12-05 MED ORDER — PEG 3350-KCL-NA BICARB-NACL 420 G PO SOLR
4000.0000 mL | Freq: Once | ORAL | 0 refills | Status: AC
  Filled 2021-12-05 – 2021-12-19 (×2): qty 4000, 1d supply, fill #0

## 2021-12-15 ENCOUNTER — Ambulatory Visit
Admission: EM | Admit: 2021-12-15 | Discharge: 2021-12-15 | Disposition: A | Discharge status: Home / Self Care | Payer: 59 | Attending: Internal Medicine | Admitting: Internal Medicine

## 2021-12-15 DIAGNOSIS — R21 Rash and other nonspecific skin eruption: Secondary | ICD-10-CM | POA: Diagnosis not present

## 2021-12-15 MED ORDER — TRIAMCINOLONE ACETONIDE 0.1 % EX CREA
1.0000 | TOPICAL_CREAM | Freq: Two times a day (BID) | CUTANEOUS | 0 refills | Status: DC
  Filled 2021-12-15: qty 30, 15d supply, fill #0

## 2021-12-15 MED ORDER — TRIAMCINOLONE ACETONIDE 0.1 % EX CREA: 1.0000 | TOPICAL_CREAM | Freq: Two times a day (BID) | CUTANEOUS | 0 refills | Status: DC

## 2021-12-15 NOTE — ED Provider Notes (Addendum)
[facility]    CSN: [id number] Arrival date & time: 12/15/21  1418      History   Chief Complaint Chief Complaint  Patient presents with   Rash    HPI Justin Burnett is a 56 y.o. male.   Patient presents with rash to back and bilateral upper extremities.  Patient reports that he felt a "stinging sensation" in the right upper back and then subsequently had a rash.  Rash is not itchy or painful.  Denies any changes in the environment including lotions, soaps, detergents, foods, etc.  Denies any associated fevers, body aches, chills. Denies feelings of throat closing or shortness of breath.  Patient takes gabapentin for neuropathy but denies any other pertinent medical history.   Rash   Past Medical History:  Diagnosis Date   Hypertension    Nerve damage    Neuromuscular disorder [facility])    Neuropathy     Patient Active Problem List   Diagnosis Date Noted   Hyperlipidemia 10/17/2021   Prediabetes 10/17/2021   Essential (primary) hypertension 09/26/2021   Neuropathy    Osteoarthritis 11/03/2011    Past Surgical History:  Procedure Laterality Date   None         Home Medications    Prior to Admission medications   Medication Sig Start Date End Date Taking? Authorizing Provider  amLODipine (NORVASC) 10 MG tablet Take 1 tablet (10 mg total) by mouth daily. 11/22/21   Rema Fendt, NP  atorvastatin (LIPITOR) 20 MG tablet Take 1 tablet (20 mg total) by mouth daily. 10/17/21 04/15/22  Rema Fendt, NP  gabapentin (NEURONTIN) 800 MG tablet Take 1 tablet (800 mg total) by mouth 2 (two) times daily. 11/22/21   Rema Fendt, NP  OVER THE COUNTER MEDICATION Vitamin D 3 two capsules daily.    [provider]  triamcinolone cream (KENALOG) 0.1 % Apply 1 Application topically 2 (two) times daily. 12/15/21   Gustavus Bryant, FNP    Family History Family History  Problem Relation Age of Onset   Colon cancer Neg Hx    Esophageal cancer Neg Hx    Rectal  cancer Neg Hx    Stomach cancer Neg Hx     Social History Social History   Tobacco Use   Smoking status: Every Day    Packs/day: 0.50    Years: 13.00    Total pack years: 6.50    Types: Cigarettes    Passive exposure: Current   Smokeless tobacco: Never  Substance Use Topics   Alcohol use: Yes    Alcohol/week: 2.0 standard drinks of alcohol    Types: 2 Standard drinks or equivalent per week    Comment: per week   Drug use: No     Allergies   Meperidine   Review of Systems Review of Systems Per HPI  Physical Exam Triage Vital Signs ED Triage Vitals  Enc Vitals Group     BP 12/15/21 1428 134/86     Pulse Rate 12/15/21 1428 83     Resp 12/15/21 1428 20     Temp 12/15/21 1428 98.4 F (36.9 C)     Temp src --      SpO2 12/15/21 1428 96 %     Weight --      Height --      Head Circumference --      Peak Flow --      Pain Score 12/15/21 1433 0     Pain  Loc --      Pain Edu? --      Excl. in GC? --    No data found.  Updated Vital Signs BP 134/86   Pulse 83   Temp 98.4 F (36.9 C)   Resp 20   SpO2 96%   Visual Acuity Right Eye Distance:   Left Eye Distance:   Bilateral Distance:    Right Eye Near:   Left Eye Near:    Bilateral Near:     Physical Exam Constitutional:      General: He is not in acute distress.    Appearance: Normal appearance. He is not toxic-appearing or diaphoretic.  HENT:     Head: Normocephalic and atraumatic.  Eyes:     Extraocular Movements: Extraocular movements intact.     Conjunctiva/sclera: Conjunctivae normal.  Pulmonary:     Effort: Pulmonary effort is normal.  Skin:    Comments: Macular, blanchable rash present throughout upper and mid back.  Scattered papular erythematous lesions throughout upper back and bilateral upper extremities.  No purulent drainage noted.  Neurological:     General: No focal deficit present.     Mental Status: He is alert and oriented to person, place, and time. Mental status is at  baseline.  Psychiatric:        Mood and Affect: Mood normal.        Behavior: Behavior normal.        Thought Content: Thought content normal.        Judgment: Judgment normal.      UC Treatments / Results  Labs (all labs ordered are listed, but only abnormal results are displayed) Labs Reviewed  CBC  COMPREHENSIVE METABOLIC PANEL    EKG   Radiology No results found.  Procedures Procedures (including critical care time)  Medications Ordered in UC Medications - No data to display  Initial Impression / Assessment and Plan / UC Course  I have reviewed the triage vital signs and the nursing notes.  Pertinent labs & imaging results that were available during my care of the patient were reviewed by me and considered in my medical decision making (see chart for details).     Unsure exact etiology of patient's rash.  Rash to back is not exactly purpuric but is very concerning that it could be.  Will obtain CMP and CBC to rule out worrisome etiologies.  Differential diagnoses include allergic contact dermatitis or irritant dermatitis.  Minimal concern for insect bite. No concern for tick bite or attachment. Will treat with triamcinolone cream.  Patient advised to follow-up if symptoms persist or worsen.  Patient verbalized understanding and was agreeable plan. Final Clinical Impressions(s) / UC Diagnoses   Final diagnoses:  Rash and nonspecific skin eruption     Discharge Instructions      You have been prescribed a cream to apply to rash.  Blood work is pending.  We will call you if there are abnormalities.  Please follow-up if symptoms persist or worsen.    ED Prescriptions     Medication Sig Dispense Auth. Provider   triamcinolone cream (KENALOG) 0.1 %  (Status: Discontinued) Apply 1 Application topically 2 (two) times daily. 30 g Ervin Knack E, [place]   triamcinolone cream (KENALOG) 0.1 % Apply 1 Application topically 2 (two) times daily. 30 g Gustavus Bryant, [place]       PDMP not reviewed this encounter.   Gustavus Bryant, [place] 12/15/21 1505    Gustavus Bryant, [place]  12/15/21 1506    Gustavus Bryant, [place] 12/15/21 1506

## 2021-12-15 NOTE — Discharge Instructions (Signed)
You have been prescribed a cream to apply to rash.  Blood work is pending.  We will call you if there are abnormalities.  Please follow-up if symptoms persist or worsen.

## 2021-12-15 NOTE — ED Triage Notes (Signed)
Patient presents to Urgent Care with complaints of rash on back and bilateral back of arms since yesterday. Patient reports he felt a stinging on his shoulder and thought he could have ben bit by something and noticed a rash when he went to look in the mirror. Pt reports no new detergents, soaps, or products. Denies itchiness. Justin Burnett

## 2021-12-16 ENCOUNTER — Other Ambulatory Visit (HOSPITAL_COMMUNITY): Payer: Self-pay

## 2021-12-16 LAB — COMPREHENSIVE METABOLIC PANEL
ALT: 22 IU/L (ref 0–44)
AST: 23 IU/L (ref 0–40)
Albumin/Globulin Ratio: 1.4 (ref 1.2–2.2)
Albumin: 4.2 g/dL (ref 3.8–4.9)
Alkaline Phosphatase: 81 IU/L (ref 44–121)
BUN/Creatinine Ratio: 15 (ref 9–20)
BUN: 15 mg/dL (ref 6–24)
Bilirubin Total: 0.3 mg/dL (ref 0.0–1.2)
CO2: 19 mmol/L — ABNORMAL LOW (ref 20–29)
Calcium: 10 mg/dL (ref 8.7–10.2)
Chloride: 104 mmol/L (ref 96–106)
Creatinine, Ser: 0.97 mg/dL (ref 0.76–1.27)
Globulin, Total: 2.9 g/dL (ref 1.5–4.5)
Glucose: 86 mg/dL (ref 70–99)
Potassium: 4.1 mmol/L (ref 3.5–5.2)
Sodium: 139 mmol/L (ref 134–144)
Total Protein: 7.1 g/dL (ref 6.0–8.5)
eGFR: 90 mL/min/{1.73_m2} (ref >59)

## 2021-12-16 LAB — CBC
Hematocrit: 42.9 % (ref 37.5–51.0)
Hemoglobin: 15.5 g/dL (ref 13.0–17.7)
MCH: 34.3 pg — ABNORMAL HIGH (ref 26.6–33.0)
MCHC: 36.1 g/dL — ABNORMAL HIGH (ref 31.5–35.7)
MCV: 95 fL (ref 79–97)
Platelets: 212 10*3/uL (ref 150–450)
RBC: 4.52 x10E6/uL (ref 4.14–5.80)
RDW: 12 % (ref 11.6–15.4)
WBC: 6.1 10*3/uL (ref 3.4–10.8)

## 2021-12-18 ENCOUNTER — Other Ambulatory Visit (HOSPITAL_COMMUNITY): Payer: Self-pay

## 2021-12-19 ENCOUNTER — Other Ambulatory Visit (HOSPITAL_COMMUNITY): Payer: Self-pay

## 2021-12-20 ENCOUNTER — Other Ambulatory Visit (HOSPITAL_COMMUNITY): Payer: Self-pay

## 2021-12-21 ENCOUNTER — Other Ambulatory Visit: Payer: Self-pay

## 2021-12-21 ENCOUNTER — Ambulatory Visit: Admission: EM | Admit: 2021-12-21 | Discharge: 2021-12-21 | Discharge status: Against Medical Advice | Payer: 59

## 2021-12-21 ENCOUNTER — Encounter (HOSPITAL_COMMUNITY): Payer: Self-pay | Admitting: *Deleted

## 2021-12-21 ENCOUNTER — Ambulatory Visit (HOSPITAL_COMMUNITY)
Admission: EM | Admit: 2021-12-21 | Discharge: 2021-12-21 | Disposition: A | Discharge status: Home / Self Care | Payer: 59 | Attending: Physician Assistant | Admitting: Physician Assistant

## 2021-12-21 DIAGNOSIS — Z760 Encounter for issue of repeat prescription: Secondary | ICD-10-CM | POA: Diagnosis not present

## 2021-12-21 DIAGNOSIS — G609 Hereditary and idiopathic neuropathy, unspecified: Secondary | ICD-10-CM

## 2021-12-21 DIAGNOSIS — G629 Polyneuropathy, unspecified: Secondary | ICD-10-CM

## 2021-12-21 MED ORDER — GABAPENTIN 800 MG PO TABS: 800.0000 mg | ORAL_TABLET | Freq: Two times a day (BID) | ORAL | 0 refills | Status: DC

## 2021-12-21 NOTE — ED Triage Notes (Addendum)
Pt reports he is going out of town for 2 weeks tomorrow  and needs a refill on med. Pt reports the med was filled at [facility] but he forgot to pick up med. PT needs Gabapentin refiled

## 2021-12-21 NOTE — ED Provider Notes (Signed)
[facility]    CSN: [id number] Arrival date & time: 12/21/21  1418      History   Chief Complaint Chief Complaint  Patient presents with   Medication Refill    HPI Justin Burnett is a 56 y.o. male.   56 year old male presents for medication refill.  Patient indicates that he has chronic lower back pain which causes him to have neuropathy of the lower extremities and feet.  Patient request a refill on his Neurontin.  Patient indicates that he had a refill that was available at the [facility] outpatient pharmacy however he forgot to pick it up.  Patient indicates he is leaving tomorrow to go out at [employer] for 2 weeks of training.  Patient indicates this medication works well and he does not have any problems or side effects from it, it does control his symptoms.   Medication Refill   Past Medical History:  Diagnosis Date   Hypertension    Nerve damage    Neuromuscular disorder [facility])    Neuropathy     Patient Active Problem List   Diagnosis Date Noted   Hyperlipidemia 10/17/2021   Prediabetes 10/17/2021   Essential (primary) hypertension 09/26/2021   Neuropathy    Osteoarthritis 11/03/2011    Past Surgical History:  Procedure Laterality Date   None         Home Medications    Prior to Admission medications   Medication Sig Start Date End Date Taking? Authorizing Provider  amLODipine (NORVASC) 10 MG tablet Take 1 tablet (10 mg total) by mouth daily. 11/22/21   Rema Fendt, NP  atorvastatin (LIPITOR) 20 MG tablet Take 1 tablet (20 mg total) by mouth daily. 10/17/21 04/15/22  Rema Fendt, NP  gabapentin (NEURONTIN) 800 MG tablet Take 1 tablet (800 mg total) by mouth 2 (two) times daily. 12/21/21   Ellsworth Lennox, PA-C  OVER THE COUNTER MEDICATION Vitamin D 3 two capsules daily.    [provider]  triamcinolone cream (KENALOG) 0.1 % Apply 1 Application topically 2 (two) times daily. 12/15/21   Gustavus Bryant, FNP    Family History Family History   Problem Relation Age of Onset   Colon cancer Neg Hx    Esophageal cancer Neg Hx    Rectal cancer Neg Hx    Stomach cancer Neg Hx     Social History Social History   Tobacco Use   Smoking status: Every Day    Packs/day: 0.50    Years: 13.00    Total pack years: 6.50    Types: Cigarettes    Passive exposure: Current   Smokeless tobacco: Never  Substance Use Topics   Alcohol use: Yes    Alcohol/week: 2.0 standard drinks of alcohol    Types: 2 Standard drinks or equivalent per week    Comment: per week   Drug use: No     Allergies   Meperidine   Review of Systems Review of Systems   Physical Exam Triage Vital Signs ED Triage Vitals  Enc Vitals Group     BP 12/21/21 1551 (!) 148/90     Pulse Rate 12/21/21 1551 66     Resp 12/21/21 1551 18     Temp 12/21/21 1551 98.5 F (36.9 C)     Temp src --      SpO2 12/21/21 1551 99 %     Weight --      Height --      Head Circumference --  Peak Flow --      Pain Score 12/21/21 1549 0     Pain Loc --      Pain Edu? --      Excl. in GC? --    No data found.  Updated Vital Signs BP (!) 148/90   Pulse 66   Temp 98.5 F (36.9 C)   Resp 18   SpO2 99%   Visual Acuity Right Eye Distance:   Left Eye Distance:   Bilateral Distance:    Right Eye Near:   Left Eye Near:    Bilateral Near:     Physical Exam Constitutional:      Appearance: Normal appearance.  Cardiovascular:     Rate and Rhythm: Normal rate and regular rhythm.     Heart sounds: Normal heart sounds.  Pulmonary:     Effort: Pulmonary effort is normal.     Breath sounds: Normal breath sounds and air entry. No wheezing, rhonchi or rales.  Neurological:     Mental Status: He is alert.      UC Treatments / Results  Labs (all labs ordered are listed, but only abnormal results are displayed) Labs Reviewed - No data to display  EKG   Radiology No results found.  Procedures Procedures (including critical care time)  Medications  Ordered in UC Medications - No data to display  Initial Impression / Assessment and Plan / UC Course  I have reviewed the triage vital signs and the nursing notes.  Pertinent labs & imaging results that were available during my care of the patient were reviewed by me and considered in my medical decision making (see chart for details).    Plan: 1.  Prescription renewal for 30 days worth of gabapentin has been sent to [facility]. 2.  Patient is to follow-up PCP or return to urgent care as needed. Final Clinical Impressions(s) / UC Diagnoses   Final diagnoses:  Medication refill  Neuropathy     Discharge Instructions      Advised patient to continue taking the Neurontin as directed. Advised to follow-up with PCP or return as needed to urgent care.    ED Prescriptions     Medication Sig Dispense Auth. Provider   gabapentin (NEURONTIN) 800 MG tablet  (Status: Discontinued) Take 1 tablet (800 mg total) by mouth 2 (two) times daily. 60 tablet Ellsworth Lennox, PA-C   gabapentin (NEURONTIN) 800 MG tablet Take 1 tablet (800 mg total) by mouth 2 (two) times daily. 60 tablet Ellsworth Lennox, PA-C      I have reviewed the PDMP during this encounter.   Ellsworth Lennox, PA-C 12/21/21 1637

## 2021-12-21 NOTE — Discharge Instructions (Addendum)
Advised patient to continue taking the Neurontin as directed. Advised to follow-up with PCP or return as needed to urgent care.

## 2021-12-23 ENCOUNTER — Other Ambulatory Visit (HOSPITAL_COMMUNITY): Payer: Self-pay

## 2021-12-26 ENCOUNTER — Encounter: Payer: 59 | Admitting: Internal Medicine

## 2021-12-27 ENCOUNTER — Other Ambulatory Visit (HOSPITAL_COMMUNITY): Payer: Self-pay

## 2022-01-10 ENCOUNTER — Encounter: Payer: Self-pay | Admitting: Internal Medicine

## 2022-01-13 ENCOUNTER — Telehealth: Payer: Self-pay | Admitting: Internal Medicine

## 2022-01-13 NOTE — Telephone Encounter (Signed)
Patient called to cancel procedure tomorrow.  He's been out of town and did not get back until today and will not have time to properly do the prep.  He has rescheduled for 9/7 at 11 a.m.

## 2022-01-14 ENCOUNTER — Encounter: Payer: 59 | Admitting: Internal Medicine

## 2022-01-20 ENCOUNTER — Other Ambulatory Visit: Payer: Self-pay

## 2022-01-20 ENCOUNTER — Other Ambulatory Visit (HOSPITAL_COMMUNITY): Payer: Self-pay

## 2022-01-20 DIAGNOSIS — G609 Hereditary and idiopathic neuropathy, unspecified: Secondary | ICD-10-CM

## 2022-01-20 MED ORDER — GABAPENTIN 800 MG PO TABS
800.0000 mg | ORAL_TABLET | Freq: Two times a day (BID) | ORAL | 0 refills | Status: DC
  Filled 2022-01-20: qty 180, 90d supply, fill #0

## 2022-01-21 ENCOUNTER — Other Ambulatory Visit: Payer: Self-pay

## 2022-02-20 ENCOUNTER — Encounter: Payer: 59 | Admitting: Internal Medicine

## 2022-02-20 ENCOUNTER — Telehealth: Payer: Self-pay | Admitting: Internal Medicine

## 2022-02-20 NOTE — Telephone Encounter (Signed)
Please call patient to see if he answers so we can notify him of call from [facility] regarding his Cologuard result.

## 2022-02-20 NOTE — Telephone Encounter (Signed)
Thank you April. Also, canceled last month. I will include his referring PCP on this correspondence. Thanks, Dr. Marina Goodell

## 2022-02-20 NOTE — Telephone Encounter (Signed)
Dr.Perry,  I called the patient at 10:18-- NO answer-- left message on cell phone to have patient call me back at my direct number regarding his colonoscopy.

## 2022-03-05 ENCOUNTER — Other Ambulatory Visit (HOSPITAL_COMMUNITY): Payer: Self-pay

## 2022-03-05 ENCOUNTER — Telehealth: Payer: Self-pay | Admitting: Family

## 2022-03-05 ENCOUNTER — Other Ambulatory Visit: Payer: Self-pay | Admitting: Family

## 2022-03-05 DIAGNOSIS — I10 Essential (primary) hypertension: Secondary | ICD-10-CM

## 2022-03-05 NOTE — Telephone Encounter (Signed)
Rx #: [id number]  amLODipine (NORVASC) 10 MG tablet [[id number]]   Pharmacy  [facility]  [address] [place] [address]  Phone:  [phone]  Fax:  [phone]  DEA #:  [id number]

## 2022-03-05 NOTE — Telephone Encounter (Signed)
Pt called to report that he is completely out his current supply

## 2022-03-06 ENCOUNTER — Other Ambulatory Visit (HOSPITAL_COMMUNITY): Payer: Self-pay

## 2022-03-06 MED ORDER — AMLODIPINE BESYLATE 10 MG PO TABS
10.0000 mg | ORAL_TABLET | Freq: Every day | ORAL | 0 refills | Status: DC
  Filled 2022-03-06: qty 90, 90d supply, fill #0

## 2022-03-06 NOTE — Telephone Encounter (Signed)
Med refilled.

## 2022-03-06 NOTE — Telephone Encounter (Signed)
Pt scheduled appt for 03/18/22 at 240 with Amy, NP for 3 mo fu.   Requested Prescriptions  Pending Prescriptions Disp Refills  . amLODipine (NORVASC) 10 MG tablet 90 tablet 0    Sig: Take 1 tablet (10 mg total) by mouth daily.     Cardiovascular: Calcium Channel Blockers 2 Failed - 03/05/2022  5:15 PM      Failed - Last BP in normal range    BP Readings from Last 1 Encounters:  12/21/21 (!) 148/90         Passed - Last Heart Rate in normal range    Pulse Readings from Last 1 Encounters:  12/21/21 66         Passed - Valid encounter within last 6 months    Recent Outpatient Visits          3 months ago Essential (primary) hypertension   Primary Care at [facility], Amy J, NP   4 months ago Annual physical exam   Primary Care at [facility], Amy J, NP   5 years ago Hereditary and idiopathic peripheral neuropathy   Primary Care at [facility], FNP   6 years ago Hereditary and idiopathic peripheral neuropathy   Primary Care at [facility], Gay Filler, MD   8 years ago Immunity status testing   Primary Care at [facility], Ranell Patrick, MD      Future Appointments            In 1 week Camillia Herter, NP Primary Care at [facility]

## 2022-03-11 NOTE — Progress Notes (Signed)
Patient ID: Justin Burnett, male    DOB: [birth year]  MRN: [id number]  CC: Chronic Care Management   Subjective: Sostenes Kauffmann is a 56 y.o. male who presents for chronic care management.   His concerns today include:  - Doing well on blood pressure medication without issues or concerns. States he has not taken blood pressure medication today as of yet. - Never began cholesterol medication.  - Intermittent bilateral knee pain for several weeks. Endorses mild swelling of the knees. Over-the-counter Voltaren gel, Ibuprofen, and Tylenol give mild relief. Denies any recent injury/trauma. - Established with Neurology. Last appointment 03/13/2022.   Patient Active Problem List   Diagnosis Date Noted   Paresthesia 03/13/2022   Chronic bilateral low back pain without sciatica 03/13/2022   Hyperlipidemia 10/17/2021   Prediabetes 10/17/2021   Essential (primary) hypertension 09/26/2021   Neuropathy    Osteoarthritis 11/03/2011     Current Outpatient Medications on File Prior to Visit  Medication Sig Dispense Refill   amLODipine (NORVASC) 10 MG tablet Take 1 tablet (10 mg total) by mouth daily. 90 tablet 0   atorvastatin (LIPITOR) 20 MG tablet Take 1 tablet (20 mg total) by mouth daily. 90 tablet 1   gabapentin (NEURONTIN) 800 MG tablet Take 1 tablet (800 mg total) by mouth 2 (two) times daily. 180 tablet 0   OVER THE COUNTER MEDICATION Vitamin D 3 two capsules daily.     triamcinolone cream (KENALOG) 0.1 % Apply 1 Application topically 2 (two) times daily. 30 g 0   No current facility-administered medications on file prior to visit.    Allergies  Allergen Reactions   Meperidine Other (See Comments)    Causes seizure-like activities    Social History   Socioeconomic History   Marital status: Married    Spouse name: [name]    Number of children: 1   Years of education: GED   Highest education level: Not on file  Occupational History   Not on file  Tobacco Use   Smoking status:  Every Day    Packs/day: 0.50    Years: 13.00    Total pack years: 6.50    Types: Cigarettes    Passive exposure: Current   Smokeless tobacco: Never  Substance and Sexual Activity   Alcohol use: Yes    Alcohol/week: 2.0 standard drinks of alcohol    Types: 2 Standard drinks or equivalent per week    Comment: per week   Drug use: No   Sexual activity: Not Currently  Other Topics Concern   Not on file  Social History Narrative   Patient lives at home with his wife ([name]).   Patient is unemployed   Art therapist. School.   Right handed.    Caffeine coffee and mountain dew.   Social Determinants of Health   Financial Resource Strain: Not on file  Food Insecurity: Not on file  Transportation Needs: Not on file  Physical Activity: Not on file  Stress: Not on file  Social Connections: Not on file  Intimate Partner Violence: Not on file    Family History  Problem Relation Age of Onset   Colon cancer Neg Hx    Esophageal cancer Neg Hx    Rectal cancer Neg Hx    Stomach cancer Neg Hx     Past Surgical History:  Procedure Laterality Date   None      ROS: Review of Systems Negative except as stated above  PHYSICAL EXAM: BP Marland Kitchen)  144/83   Pulse 82   Temp 98.3 F (36.8 C)   Resp 16   Ht 5' 11.02" (1.804 m)   Wt 240 lb (108.9 kg)   SpO2 99%   BMI 33.45 kg/m   Physical Exam HENT:     Head: Normocephalic and atraumatic.  Eyes:     Extraocular Movements: Extraocular movements intact.     Conjunctiva/sclera: Conjunctivae normal.     Pupils: Pupils are equal, round, and reactive to light.  Cardiovascular:     Rate and Rhythm: Normal rate and regular rhythm.     Pulses: Normal pulses.     Heart sounds: Normal heart sounds.  Pulmonary:     Effort: Pulmonary effort is normal.     Breath sounds: Normal breath sounds.  Musculoskeletal:     Cervical back: Normal range of motion and neck supple.     Right knee: Tenderness present.     Left knee: Tenderness  present.  Neurological:     Mental Status: He is alert.  Psychiatric:        Mood and Affect: Mood normal.        Behavior: Behavior normal.     ASSESSMENT AND PLAN: 1. Essential (primary) hypertension - Blood pressure not at goal during today's visit. Patient asymptomatic without chest pressure, chest pain, palpitations, shortness of breath, worst headache of life, and any additional red flag symptoms. - Patient has not had blood pressure medication today as of yet. Recent blood pressure 03/13/2022 during Neurology appointment at goal. - Continue Amlodipine as prescribed. No refills needed as of present.  - Follow-up with primary provider in 3 months or sooner if needed.   2. Hyperlipidemia, unspecified hyperlipidemia type - Update lab. - Lipid Panel  3. Acute pain of both knees - Routine screening uric acid. - Triamcinolone acetonide injection administered in office.  - Continue over-the-counter regimen.  - Referral to Orthopedic Surgery for further evaluation and management. - Uric Acid - triamcinolone acetonide (KENALOG-40) injection 40 mg - Ambulatory referral to Orthopedic Surgery  4. Prediabetes - Routine screening.  - Hemoglobin A1c  5. Paresthesia 6. Chronic bilateral low back pain without sciatica - Keep all scheduled appointments with Neurology.    Patient was given the opportunity to ask questions.  Patient verbalized understanding of the plan and was able to repeat key elements of the plan. Patient was given clear instructions to go to Emergency Department or return to medical center if symptoms don't improve, worsen, or new problems develop.The patient verbalized understanding.   Orders Placed This Encounter  Procedures   Lipid Panel   Hemoglobin A1c   Uric Acid   Ambulatory referral to Orthopedic Surgery    Return in about 3 months (around 06/18/2022) for Follow-Up or next available chronic care mgmt.  Rema Fendt, NP

## 2022-03-13 ENCOUNTER — Encounter: Payer: Self-pay | Admitting: Neurology

## 2022-03-13 ENCOUNTER — Ambulatory Visit (INDEPENDENT_AMBULATORY_CARE_PROVIDER_SITE_OTHER): Payer: BC Managed Care – PPO | Admitting: Neurology

## 2022-03-13 VITALS — BP 138/77 | HR 76 | Ht 72.0 in | Wt 239.0 lb

## 2022-03-13 DIAGNOSIS — M545 Low back pain, unspecified: Secondary | ICD-10-CM

## 2022-03-13 DIAGNOSIS — R202 Paresthesia of skin: Secondary | ICD-10-CM

## 2022-03-13 DIAGNOSIS — G8929 Other chronic pain: Secondary | ICD-10-CM

## 2022-03-13 HISTORY — DX: Paresthesia of skin: R20.2

## 2022-03-13 HISTORY — DX: Low back pain, unspecified: M54.50

## 2022-03-13 HISTORY — DX: Other chronic pain: G89.29

## 2022-03-13 NOTE — Progress Notes (Signed)
Chief Complaint  Patient presents with   New Patient (Initial Visit)    Rm 15. Alone. NX Dr. Ronette Deter 2015/internal referral for Hereditary and idiopathic peripheral neuropathy.      ASSESSMENT AND PLAN  Justin Burnett is a 56 y.o. male  Chronic low back pain, Bilateral lower extremity achy pain paresthesia from mid thigh down, Long history of daily alcohol use, at least moderate amount  Length-dependent sensory changes, absent ankle reflex, brisk bilateral upper extremity and especially patellar reflex,  Evidence of peripheral neuropathy, could not rule out a component of cervical spondylitic myelopathy  Discussed with patient about evaluation options such as EMG nerve conduction study, MRI of cervical spine, he is hesitated about the cost, wants to hold off evaluation at this point  Gabapentin 800 mg twice a day works well for him  Emphasized the importance of decrease daily alcohol use,  Return to clinic for worsening symptoms   DIAGNOSTIC DATA (LABS, IMAGING, TESTING) - I reviewed patient records, labs, notes, testing and imaging myself where available. Laboratory evaluations, normal  CBC, CMP, A1c 5.7, TSH, lipid panel showed LDL 135   MEDICAL HISTORY:  Justin Burnett is a 56 year old male, seen in request by nurse practitioner Zonia Kief, Amy for evaluation of bilateral lower extremity paresthesia initial evaluation was on March 13, 2022    I reviewed and summarized the referring note. PMHX. HTN HLD Alcohol daily 3 beers+12 oz hard liquor, Smoke 1/2 ppd.  Patient reported gradual onset bilateral lower extremity paresthesia, started around 2016, after he experienced low back pain, radiating pain to bilateral lower extremity, that was improved after epidural injection, but shortly after that, he began to notice numbness from bilateral mid spine down to lower extremity, he denies significant bilateral plantar feet pain or paresthesia,  Since 2020, he noticed  intermittent bilateral fourth and fifth finger numbness, denies weakness, denies gait abnormality, denies incontinence  He smokes at least half pack a day, also drink regularly, 3 beers plus some hard liquor on a daily basis, patient only will drink more     PHYSICAL EXAM:   Vitals:   03/13/22 0916  BP: 138/77  Pulse: 76  Weight: 239 lb (108.4 kg)  Height: 6' (1.829 m)   Not recorded     Body mass index is 32.41 kg/m.  PHYSICAL EXAMNIATION:  Gen: NAD, conversant, well nourised, well groomed                     Cardiovascular: Regular rate rhythm, no peripheral edema, warm, nontender. Eyes: Conjunctivae clear without exudates or hemorrhage Neck: Supple, no carotid bruits. Pulmonary: Clear to auscultation bilaterally   NEUROLOGICAL EXAM:  MENTAL STATUS: Speech/cognition: Awake, alert, oriented to history taking and casual conversation CRANIAL NERVES: CN II: Visual fields are full to confrontation. Pupils are round equal and briskly reactive to light. CN III, IV, VI: extraocular movement are normal. No ptosis. CN V: Facial sensation is intact to light touch CN VII: Face is symmetric with normal eye closure  CN VIII: Hearing is normal to causal conversation. CN IX, X: Phonation is normal. CN XI: Head turning and shoulder shrug are intact  MOTOR: Bilateral hallux valgus, right toe extension, flexion weakness  REFLEXES: Reflexes are 2+ and symmetric at the biceps, triceps, 3/3 knees, and absent at ankles. Plantar responses are flexor.  SENSORY: Length-dependent decreased vibratory sensation, pinprick to distal shin level  COORDINATION: There is no trunk or limb dysmetria noted.  GAIT/STANCE: He can get  up from seated position arm crossed, bilateral valrus knee, flat foot, steady gait, but mild body swing while ambulating  REVIEW OF SYSTEMS:  Full 14 system review of systems performed and notable only for as above All other review of systems were  negative.   ALLERGIES: Allergies  Allergen Reactions   Meperidine Other (See Comments)    Causes seizure-like activities    HOME MEDICATIONS: Current Outpatient Medications  Medication Sig Dispense Refill   amLODipine (NORVASC) 10 MG tablet Take 1 tablet (10 mg total) by mouth daily. 90 tablet 0   atorvastatin (LIPITOR) 20 MG tablet Take 1 tablet (20 mg total) by mouth daily. 90 tablet 1   gabapentin (NEURONTIN) 800 MG tablet Take 1 tablet (800 mg total) by mouth 2 (two) times daily. 180 tablet 0   OVER THE COUNTER MEDICATION Vitamin D 3 two capsules daily.     triamcinolone cream (KENALOG) 0.1 % Apply 1 Application topically 2 (two) times daily. 30 g 0   No current facility-administered medications for this visit.    PAST MEDICAL HISTORY: Past Medical History:  Diagnosis Date   Hypertension    Nerve damage    Neuromuscular disorder ([place])    Neuropathy     PAST SURGICAL HISTORY: Past Surgical History:  Procedure Laterality Date   None      FAMILY HISTORY: Family History  Problem Relation Age of Onset   Colon cancer Neg Hx    Esophageal cancer Neg Hx    Rectal cancer Neg Hx    Stomach cancer Neg Hx     SOCIAL HISTORY: Social History   Socioeconomic History   Marital status: Married    Spouse name: [name]    Number of children: 1   Years of education: GED   Highest education level: Not on file  Occupational History   Not on file  Tobacco Use   Smoking status: Every Day    Packs/day: 0.50    Years: 13.00    Total pack years: 6.50    Types: Cigarettes    Passive exposure: Current   Smokeless tobacco: Never  Substance and Sexual Activity   Alcohol use: Yes    Alcohol/week: 2.0 standard drinks of alcohol    Types: 2 Standard drinks or equivalent per week    Comment: per week   Drug use: No   Sexual activity: Not Currently  Other Topics Concern   Not on file  Social History Narrative   Patient lives at home with his wife ([name]).   Patient is  unemployed   Art therapist. School.   Right handed.    Caffeine coffee and mountain dew.   Social Determinants of Health   Financial Resource Strain: Not on file  Food Insecurity: Not on file  Transportation Needs: Not on file  Physical Activity: Not on file  Stress: Not on file  Social Connections: Not on file  Intimate Partner Violence: Not on file      Marcial Pacas, M.D. Ph.D.  [facility] [address], [place], [place] [address] Ph: [phone] Fax: [phone]  CC:  Camillia Herter, NP [place],   [id number]  Camillia Herter, NP

## 2022-03-18 ENCOUNTER — Encounter: Payer: Self-pay | Admitting: Family

## 2022-03-18 ENCOUNTER — Ambulatory Visit (INDEPENDENT_AMBULATORY_CARE_PROVIDER_SITE_OTHER): Payer: BC Managed Care – PPO | Admitting: Family

## 2022-03-18 VITALS — BP 144/83 | HR 82 | Temp 98.3°F | Resp 16 | Ht 71.02 in | Wt 240.0 lb

## 2022-03-18 DIAGNOSIS — I10 Essential (primary) hypertension: Secondary | ICD-10-CM

## 2022-03-18 DIAGNOSIS — R202 Paresthesia of skin: Secondary | ICD-10-CM

## 2022-03-18 DIAGNOSIS — R7303 Prediabetes: Secondary | ICD-10-CM

## 2022-03-18 DIAGNOSIS — E785 Hyperlipidemia, unspecified: Secondary | ICD-10-CM

## 2022-03-18 DIAGNOSIS — M25562 Pain in left knee: Secondary | ICD-10-CM

## 2022-03-18 DIAGNOSIS — M25561 Pain in right knee: Secondary | ICD-10-CM | POA: Diagnosis not present

## 2022-03-18 DIAGNOSIS — M545 Low back pain, unspecified: Secondary | ICD-10-CM

## 2022-03-18 DIAGNOSIS — G8929 Other chronic pain: Secondary | ICD-10-CM

## 2022-03-18 MED ORDER — TRIAMCINOLONE ACETONIDE 40 MG/ML IJ SUSP
40.0000 mg | Freq: Once | INTRAMUSCULAR | Status: AC
  Administered 2022-03-18: 40 mg via INTRAMUSCULAR

## 2022-03-18 NOTE — Progress Notes (Signed)
.  Pt presents for chronic care management   -experiencing bilateral knee pain approx 2 weeks  -using Voltaren and wearing knee brace

## 2022-03-19 ENCOUNTER — Other Ambulatory Visit: Payer: Self-pay | Admitting: Family

## 2022-03-19 ENCOUNTER — Other Ambulatory Visit (HOSPITAL_COMMUNITY): Payer: Self-pay

## 2022-03-19 DIAGNOSIS — E785 Hyperlipidemia, unspecified: Secondary | ICD-10-CM

## 2022-03-19 LAB — LIPID PANEL
Chol/HDL Ratio: 3.6 ratio (ref 0.0–5.0)
Cholesterol, Total: 210 mg/dL — ABNORMAL HIGH (ref 100–199)
HDL: 58 mg/dL (ref >39)
LDL Chol Calc (NIH): 109 mg/dL — ABNORMAL HIGH (ref 0–99)
Triglycerides: 253 mg/dL — ABNORMAL HIGH (ref 0–149)
VLDL Cholesterol Cal: 43 mg/dL — ABNORMAL HIGH (ref 5–40)

## 2022-03-19 LAB — HEMOGLOBIN A1C
Est. average glucose Bld gHb Est-mCnc: 123 mg/dL
Hgb A1c MFr Bld: 5.9 % — ABNORMAL HIGH (ref 4.8–5.6)

## 2022-03-19 LAB — URIC ACID: Uric Acid: 6.1 mg/dL (ref 3.8–8.4)

## 2022-03-19 MED ORDER — ATORVASTATIN CALCIUM 20 MG PO TABS
20.0000 mg | ORAL_TABLET | Freq: Every day | ORAL | 2 refills | Status: DC
  Filled 2022-03-19 – 2022-04-04 (×2): qty 30, 30d supply, fill #0
  Filled 2022-04-24: qty 30, 30d supply, fill #1
  Filled 2022-06-03: qty 30, 30d supply, fill #2

## 2022-03-28 ENCOUNTER — Other Ambulatory Visit (HOSPITAL_COMMUNITY): Payer: Self-pay

## 2022-04-04 ENCOUNTER — Other Ambulatory Visit (HOSPITAL_COMMUNITY): Payer: Self-pay

## 2022-04-21 ENCOUNTER — Encounter: Payer: Self-pay | Admitting: Surgery

## 2022-04-21 ENCOUNTER — Ambulatory Visit: Payer: Self-pay

## 2022-04-21 ENCOUNTER — Ambulatory Visit (INDEPENDENT_AMBULATORY_CARE_PROVIDER_SITE_OTHER): Payer: BC Managed Care – PPO | Admitting: Surgery

## 2022-04-21 VITALS — BP 153/80 | HR 78 | Ht 71.02 in | Wt 240.0 lb

## 2022-04-21 DIAGNOSIS — M25561 Pain in right knee: Secondary | ICD-10-CM

## 2022-04-21 DIAGNOSIS — M13861 Other specified arthritis, right knee: Secondary | ICD-10-CM | POA: Diagnosis not present

## 2022-04-21 DIAGNOSIS — M13862 Other specified arthritis, left knee: Secondary | ICD-10-CM

## 2022-04-21 DIAGNOSIS — M2550 Pain in unspecified joint: Secondary | ICD-10-CM

## 2022-04-21 DIAGNOSIS — M1712 Unilateral primary osteoarthritis, left knee: Secondary | ICD-10-CM

## 2022-04-21 DIAGNOSIS — M25562 Pain in left knee: Secondary | ICD-10-CM | POA: Diagnosis not present

## 2022-04-21 DIAGNOSIS — M1711 Unilateral primary osteoarthritis, right knee: Secondary | ICD-10-CM

## 2022-04-21 NOTE — Progress Notes (Unsigned)
Office Visit Note   Patient: Justin Burnett           Date of Birth: Nov 17, 1965           MRN: [id number] Visit Date: 04/21/2022              Requested by: Camillia Herter, NP [address] [place],  [place] [address] PCP: Camillia Herter, NP   Assessment & Plan: Visit Diagnoses:  1. Acute pain of both knees   2. Polyarthralgia   3. Arthritis of left knee   4. Arthritis of knee, right     Plan: Advised patient that with the significant degenerative changes that he has in both knees  Follow-Up Instructions: Return in about 1 week (around 04/28/2022) for [place].   Orders:  Orders Placed This Encounter  Procedures   Large Joint Inj   XR KNEE 3 VIEW LEFT   XR KNEE 3 VIEW RIGHT   CBC   Sed Rate (ESR)   Antinuclear Antib (ANA)   Rheumatoid Factor   Uric acid   C-reactive protein   No orders of the defined types were placed in this encounter.     Procedures: Large Joint Inj: L knee on 04/21/2022 10:32 AM Indications: pain and joint swelling Details: 22 G 1.5 in needle, superolateral approach Medications: 3 mL lidocaine 1 %; 6 mL bupivacaine 0.5 %; 80 mg methylPREDNISolone acetate 40 MG/ML Aspirate: 125 mL serous Outcome: tolerated well, no immediate complications  Patient did have good improvement of his left knee pain with anesthetic in place. Patient was prepped and draped in the usual sterile fashion.      Clinical Data: No additional findings.   Subjective: Chief Complaint  Patient presents with   Right Knee - Pain   Left Knee - Pain    HPI  Review of Systems   Objective: Vital Signs: BP (!) 153/80   Pulse 78   Ht 5' 11.02" (1.804 m)   Wt 240 lb (108.9 kg)   BMI 33.45 kg/m   Physical Exam  Ortho Exam  Specialty Comments:  No specialty comments available.  Imaging: No results found.   PMFS History: Patient Active Problem List   Diagnosis Date Noted   Paresthesia 03/13/2022    Chronic bilateral low back pain without sciatica 03/13/2022   Hyperlipidemia 10/17/2021   Prediabetes 10/17/2021   Essential (primary) hypertension 09/26/2021   Neuropathy    Osteoarthritis 11/03/2011   Past Medical History:  Diagnosis Date   Hypertension    Nerve damage    Neuromuscular disorder ([place])    Neuropathy     Family History  Problem Relation Age of Onset   Colon cancer Neg Hx    Esophageal cancer Neg Hx    Rectal cancer Neg Hx    Stomach cancer Neg Hx     Past Surgical History:  Procedure Laterality Date   None     Social History   Occupational History   Not on file  Tobacco Use   Smoking status: Every Day    Packs/day: 0.50    Years: 13.00    Total pack years: 6.50    Types: Cigarettes    Passive exposure: Current   Smokeless tobacco: Never  Substance and Sexual Activity   Alcohol use: Yes    Alcohol/week: 2.0 standard drinks of alcohol    Types: 2 Standard drinks or equivalent per week    Comment: per week  Drug use: No   Sexual activity: Not Currently

## 2022-04-23 ENCOUNTER — Telehealth: Payer: Self-pay | Admitting: Family

## 2022-04-23 LAB — CBC
HCT: 44.2 % (ref 38.5–50.0)
Hemoglobin: 15.6 g/dL (ref 13.2–17.1)
MCH: 33.3 pg — ABNORMAL HIGH (ref 27.0–33.0)
MCHC: 35.3 g/dL (ref 32.0–36.0)
MCV: 94.4 fL (ref 80.0–100.0)
MPV: 9.9 fL (ref 7.5–12.5)
Platelets: 245 10*3/uL (ref 140–400)
RBC: 4.68 10*6/uL (ref 4.20–5.80)
RDW: 12.3 % (ref 11.0–15.0)
WBC: 6.4 10*3/uL (ref 3.8–10.8)

## 2022-04-23 LAB — URIC ACID: Uric Acid, Serum: 6.1 mg/dL (ref 4.0–8.0)

## 2022-04-23 LAB — ANA: Anti Nuclear Antibody (ANA): POSITIVE — AB

## 2022-04-23 LAB — C-REACTIVE PROTEIN: CRP: 1.1 mg/L (ref <8.0)

## 2022-04-23 LAB — RHEUMATOID FACTOR: Rheumatoid fact SerPl-aCnc: <14 IU/mL (ref <14)

## 2022-04-23 LAB — SEDIMENTATION RATE: Sed Rate: 17 mm/h (ref 0–20)

## 2022-04-23 LAB — ANTI-NUCLEAR AB-TITER (ANA TITER): ANA Titer 1: 1:40 {titer} — ABNORMAL HIGH

## 2022-04-23 MED ORDER — METHYLPREDNISOLONE ACETATE 40 MG/ML IJ SUSP
80.0000 mg | INTRAMUSCULAR | Status: AC | PRN
  Administered 2022-04-21: 80 mg via INTRA_ARTICULAR

## 2022-04-23 MED ORDER — LIDOCAINE HCL 1 % IJ SOLN
3.0000 mL | INTRAMUSCULAR | Status: AC | PRN
  Administered 2022-04-21: 3 mL

## 2022-04-23 MED ORDER — BUPIVACAINE HCL 0.5 % IJ SOLN
6.0000 mL | INTRAMUSCULAR | Status: AC | PRN
  Administered 2022-04-21: 6 mL via INTRA_ARTICULAR

## 2022-04-23 NOTE — Telephone Encounter (Signed)
1 pill left of   atorvastatin (LIPITOR) 20 MG tablet [[id number]]  and needs Rx #: [id number]   gabapentin (NEURONTIN) 800 MG tablet [[id number]]  Pharmacy  [place] [facility] [address] [place] [address] Phone: [phone]  Fax: [phone] DEA #: [id number]

## 2022-04-24 ENCOUNTER — Other Ambulatory Visit (HOSPITAL_COMMUNITY): Payer: Self-pay

## 2022-04-24 ENCOUNTER — Other Ambulatory Visit: Payer: Self-pay | Admitting: Family

## 2022-04-24 DIAGNOSIS — G609 Hereditary and idiopathic neuropathy, unspecified: Secondary | ICD-10-CM

## 2022-04-24 MED ORDER — GABAPENTIN 800 MG PO TABS
800.0000 mg | ORAL_TABLET | Freq: Two times a day (BID) | ORAL | 2 refills | Status: DC
  Filled 2022-04-24: qty 60, 30d supply, fill #0
  Filled 2022-05-23: qty 60, 30d supply, fill #1
  Filled 2022-06-24: qty 60, 30d supply, fill #2

## 2022-04-24 NOTE — Telephone Encounter (Signed)
Order complete. 

## 2022-04-28 ENCOUNTER — Encounter: Payer: Self-pay | Admitting: Surgery

## 2022-04-28 ENCOUNTER — Ambulatory Visit (INDEPENDENT_AMBULATORY_CARE_PROVIDER_SITE_OTHER): Payer: BC Managed Care – PPO | Admitting: Surgery

## 2022-04-28 ENCOUNTER — Other Ambulatory Visit (HOSPITAL_COMMUNITY): Payer: Self-pay

## 2022-04-28 VITALS — BP 122/74 | HR 75 | Ht 71.0 in | Wt 240.0 lb

## 2022-04-28 DIAGNOSIS — M25461 Effusion, right knee: Secondary | ICD-10-CM

## 2022-04-28 DIAGNOSIS — M1712 Unilateral primary osteoarthritis, left knee: Secondary | ICD-10-CM | POA: Diagnosis not present

## 2022-04-28 DIAGNOSIS — M170 Bilateral primary osteoarthritis of knee: Secondary | ICD-10-CM

## 2022-04-28 DIAGNOSIS — M1711 Unilateral primary osteoarthritis, right knee: Secondary | ICD-10-CM

## 2022-04-28 DIAGNOSIS — M25561 Pain in right knee: Secondary | ICD-10-CM | POA: Diagnosis not present

## 2022-04-28 DIAGNOSIS — R768 Other specified abnormal immunological findings in serum: Secondary | ICD-10-CM

## 2022-04-28 DIAGNOSIS — G8929 Other chronic pain: Secondary | ICD-10-CM

## 2022-04-28 MED ORDER — BUPIVACAINE HCL 0.25 % IJ SOLN
6.0000 mL | INTRAMUSCULAR | Status: AC | PRN
  Administered 2022-04-28: 6 mL via INTRA_ARTICULAR

## 2022-04-28 MED ORDER — METHYLPREDNISOLONE ACETATE 40 MG/ML IJ SUSP
80.0000 mg | INTRAMUSCULAR | Status: AC | PRN
  Administered 2022-04-28: 80 mg via INTRA_ARTICULAR

## 2022-04-28 MED ORDER — LIDOCAINE HCL 1 % IJ SOLN
3.0000 mL | INTRAMUSCULAR | Status: AC | PRN
  Administered 2022-04-28: 3 mL

## 2022-04-28 NOTE — Progress Notes (Signed)
Office Visit Note   Patient: Justin Burnett           Date of Birth: 10/04/1965           MRN: [id number] Visit Date: 04/28/2022              Requested by: Rema Fendt, NP [address] [place],  [place] [address] PCP: Rema Fendt, NP   Assessment & Plan: Visit Diagnoses:  1. Arthritis of knee, right   2. Arthritis of left knee   3. Effusion, right knee   4. Chronic pain of right knee   5. Positive ANA (antinuclear antibody)     Plan: Patient said that he wants to proceed with right knee aspiration and injection.  He understands if he does not get long-term relief with this that he has to wait at least 90 days to have total knee replacement.  After patient consent right knee was prepped with Betadine and after using 3 cc lidocaine for local anesthetic 340 cc of serous fluid aspirated from a superolateral patellar approach.  I then injected Marcaine/Depo-Medrol.  I did speak with Dr. August Saucer about the amount of fluid aspirated.  He recommends getting MRI right knee to rule out PVNS.  Was ordered.  With patient's positive ANA I will refer to rheumatology.  Advised that the lab value is low and rheumatologist may find this clinically insignificant but I want them to make that judgment.  I will schedule follow-up in 6 weeks with me for recheck but he will let me know when the scan is done and I will have him come in sooner.  Follow-Up Instructions: Return in about 6 weeks (around 06/09/2022) for WITH Timo Hartwig FOR RECHECK BILATERAL KNEES. .   Orders:  Orders Placed This Encounter  Procedures   Large Joint Inj: R knee   MR Knee Right w/o contrast   Ambulatory referral to Rheumatology   No orders of the defined types were placed in this encounter.     Procedures: Large Joint Inj: R knee on 04/28/2022 9:25 AM Indications: pain and joint swelling Details: 18 G 1.5 in needle, superolateral approach Medications: 3 mL lidocaine 1 %; 6 mL bupivacaine 0.25 %; 80 mg  methylPREDNISolone acetate 40 MG/ML Aspirate: 340 mL serous Outcome: tolerated well, no immediate complications  340 cc of serous fluid aspirated from the right knee today. Consent was given by the patient. Patient was prepped and draped in the usual sterile fashion.       Clinical Data: No additional findings.   Subjective: Chief Complaint  Patient presents with   Right Knee - Pain   Left Knee - Pain    HPI 56 year old black male returns recheck of bilateral knee pain.  States that left knee is "better than what it was" after aspiration and injection.  He states that he is wanting to proceed with right knee aspiration and injection and not wanting to schedule right total knee replacement.  States that he understands that if he does not have long-term relief with the injection that he will have to wait at least 90 days to have surgery.  Labs drawn at last office visit showed:  Component Ref Range & Units 7 d ago  ANA Titer 1 titer 1:40 High   Comment: A low level ANA titer may be present in pre-clinical autoimmune diseases and normal individuals.                 Reference Range                 <  1:40        Negative                 1:40-1:80    Low Antibody Level                 >1:80        Elevated Antibody Level .  ANA Pattern 1  Cytoplasmic Abnormal   Comment: The presence of cytoplasmic fluorescence was noted on the HEp-2 slide. Other reactivities (e.g., anti- mitochondrial antibodies or anti-smooth muscle antibodies) may be responsible for this fluorescence. The clinical significance of this finding is uncertain. Clinical correlation is recommended.   Review of Systems No current complaints of cardiopulmonary GI/GU issues  Objective: Vital Signs: BP 122/74 (BP Location: Left Arm, Patient Position: Sitting, Cuff Size: Large)   Pulse 75   Ht 5\' 11"  (1.803 m)   Wt 240 lb (108.9 kg)   BMI 33.47 kg/m   Physical Exam HENT:     Head: Normocephalic and atraumatic.   Eyes:     Extraocular Movements: Extraocular movements intact.  Pulmonary:     Effort: No respiratory distress.  Musculoskeletal:     Comments: Left knee today is definitely less swollen than previous visit.  Right knee has very large effusion.  Joint line tender.  Positive crepitus.  Neurological:     Mental Status: He is alert and oriented to person, place, and time.  Psychiatric:        Mood and Affect: Mood normal.     Ortho Exam  Specialty Comments:  No specialty comments available.  Imaging: No results found.   PMFS History: Patient Active Problem List   Diagnosis Date Noted   Paresthesia 03/13/2022   Chronic bilateral low back pain without sciatica 03/13/2022   Hyperlipidemia 10/17/2021   Prediabetes 10/17/2021   Essential (primary) hypertension 09/26/2021   Neuropathy    Osteoarthritis 11/03/2011   Past Medical History:  Diagnosis Date   Hypertension    Nerve damage    Neuromuscular disorder ([facility])    Neuropathy     Family History  Problem Relation Age of Onset   Colon cancer Neg Hx    Esophageal cancer Neg Hx    Rectal cancer Neg Hx    Stomach cancer Neg Hx     Past Surgical History:  Procedure Laterality Date   None     Social History   Occupational History   Not on file  Tobacco Use   Smoking status: Every Day    Packs/day: 0.50    Years: 13.00    Total pack years: 6.50    Types: Cigarettes    Passive exposure: Current   Smokeless tobacco: Never  Substance and Sexual Activity   Alcohol use: Yes    Alcohol/week: 2.0 standard drinks of alcohol    Types: 2 Standard drinks or equivalent per week    Comment: per week   Drug use: No   Sexual activity: Not Currently

## 2022-05-02 ENCOUNTER — Other Ambulatory Visit (HOSPITAL_COMMUNITY): Payer: Self-pay

## 2022-06-03 ENCOUNTER — Other Ambulatory Visit (HOSPITAL_COMMUNITY): Payer: Self-pay

## 2022-06-03 ENCOUNTER — Other Ambulatory Visit: Payer: Self-pay | Admitting: Family

## 2022-06-03 DIAGNOSIS — I10 Essential (primary) hypertension: Secondary | ICD-10-CM

## 2022-06-03 MED ORDER — AMLODIPINE BESYLATE 10 MG PO TABS
10.0000 mg | ORAL_TABLET | Freq: Every day | ORAL | 0 refills | Status: DC
  Filled 2022-06-03: qty 90, 90d supply, fill #0

## 2022-06-04 ENCOUNTER — Other Ambulatory Visit (HOSPITAL_COMMUNITY): Payer: Self-pay

## 2022-06-11 ENCOUNTER — Ambulatory Visit: Payer: BC Managed Care – PPO | Admitting: Surgery

## 2022-06-12 NOTE — Progress Notes (Signed)
Erroneous encounter-disregard

## 2022-06-13 ENCOUNTER — Ambulatory Visit (INDEPENDENT_AMBULATORY_CARE_PROVIDER_SITE_OTHER): Payer: BC Managed Care – PPO | Admitting: Physician Assistant

## 2022-06-13 ENCOUNTER — Other Ambulatory Visit (HOSPITAL_COMMUNITY): Payer: Self-pay

## 2022-06-13 ENCOUNTER — Encounter: Payer: Self-pay | Admitting: Physician Assistant

## 2022-06-13 VITALS — Ht 72.0 in | Wt 237.6 lb

## 2022-06-13 DIAGNOSIS — M1711 Unilateral primary osteoarthritis, right knee: Secondary | ICD-10-CM | POA: Insufficient documentation

## 2022-06-13 DIAGNOSIS — M170 Bilateral primary osteoarthritis of knee: Secondary | ICD-10-CM

## 2022-06-13 HISTORY — DX: Bilateral primary osteoarthritis of knee: M17.0

## 2022-06-13 MED ORDER — MELOXICAM 15 MG PO TABS
15.0000 mg | ORAL_TABLET | Freq: Every day | ORAL | 0 refills | Status: DC
  Filled 2022-06-13: qty 30, 30d supply, fill #0

## 2022-06-13 NOTE — Progress Notes (Signed)
Office Visit Note   Patient: Justin Burnett           Date of Birth: 1966/04/12           MRN: [id number] Visit Date: 06/13/2022              Requested by: Rema Fendt, NP [address] [place],  [place] [address] PCP: Rema Fendt, NP  No chief complaint on file.     HPI: Justin Burnett is a pleasant 56 year old gentleman with a 3 to 4-year history of right greater than left knee pain.  Denies any injuries but did quite a bit of power lifting when he was younger.  He was seen by Zonia Kief about 7 weeks ago.  Fluid was aspirated and he was injected into his bilateral knees with methylprednisolone.  He said he did get about 5 to 6 weeks of relief but now the relief pain is returned.  He finds it difficult to do activities he enjoys such as walking long distances.  The right knee is worse than the left.  He is currently using a knee support.  He also takes over-the-counter Advil and Aleve which helps a small amount.  He is also tried topical Voltaren gel  Assessment & Plan: Visit Diagnoses:  1. Primary osteoarthritis of both knees     Plan: Justin Burnett is a pleasant 56 year old gentleman with end-stage tricompartmental arthritis of both his knees.  The right is clinically and more varus malalignment and is more painful to him.  He has tried topical anti-inflammatories and did recently try a cortisone injection which gave him about 6 to 7 weeks of relief.  His BMI is 32.  His most recent A1c was 5.8.  Fayrene Fearing did draw inflammatory labs and the only mildly positive result was a slightly elevated ana we had a long discussion with treatment options.  He is considering knee replacement.  Although he is only 51 given his x-rays this is certainly understandable.  I will refer him to Dr. Roda Shutters.  He is considering doing this sometime late in the winter.  I did give him a prescription for Mobic to see if this helps him in the meantime.  He understands he is not to take Advil or Aleve when he  is taking this medication may take Tylenol  Follow-Up Instructions: Return For discussion of knee replacement with Dr. Roda Shutters.   Ortho Exam  Patient is alert, oriented, no adenopathy, well-dressed, normal affect, normal respiratory effort. Ambulates with antalgic gait with clinical right greater than left varus alignment.  Has a mild effusion in the right knee but no erythema no warmth significant grinding with range of motion tender more over the medial and lateral joint line.  Left knee minimal effusion mild warmth but no erythema.  Grinding with range of motion again more tenderness medially and laterally does have some varus malalignment but not nearly as much as the right compartments of the lower extremities are soft nontender he is neurovascularly intact  Imaging: No results found. No images are attached to the encounter.  Labs: Lab Results  Component Value Date   HGBA1C 5.9 (H) 03/18/2022   HGBA1C 5.7 (H) 10/16/2021   ESRSEDRATE 17 04/21/2022   CRP 1.1 04/21/2022   LABURIC 6.1 04/21/2022   LABURIC 6.1 03/18/2022     Lab Results  Component Value Date   ALBUMIN 4.2 12/15/2021   ALBUMIN 4.5 10/16/2021    No results found for: "MG" No  results found for: "VD25OH"  No results found for: "PREALBUMIN"    Latest Ref Rng & Units 04/21/2022    8:45 AM 12/15/2021    2:55 PM 10/16/2021   11:12 AM  CBC EXTENDED  WBC 3.8 - 10.8 Thousand/uL 6.4  6.1  4.1   RBC 4.20 - 5.80 Million/uL 4.68  4.52  4.76   Hemoglobin 13.2 - 17.1 g/dL 29.7  98.9  21.1   HCT 38.5 - 50.0 % 44.2  42.9  45.8   Platelets 140 - 400 Thousand/uL 245  212  168      Body mass index is 32.22 kg/m.  Orders:  No orders of the defined types were placed in this encounter.  No orders of the defined types were placed in this encounter.    Procedures: No procedures performed  Clinical Data: No additional findings.  ROS:  All other systems negative, except as noted in the HPI. Review of  Systems  Objective: Vital Signs: Ht 6' (1.829 m)   Wt 237 lb 9.6 oz (107.8 kg)   BMI 32.22 kg/m   Specialty Comments:  No specialty comments available.  PMFS History: Patient Active Problem List   Diagnosis Date Noted   Osteoarthritis of knees, bilateral 06/13/2022   Paresthesia 03/13/2022   Chronic bilateral low back pain without sciatica 03/13/2022   Hyperlipidemia 10/17/2021   Prediabetes 10/17/2021   Essential (primary) hypertension 09/26/2021   Neuropathy    Osteoarthritis 11/03/2011   Past Medical History:  Diagnosis Date   Hypertension    Nerve damage    Neuromuscular disorder ([facility])    Neuropathy     Family History  Problem Relation Age of Onset   Colon cancer Neg Hx    Esophageal cancer Neg Hx    Rectal cancer Neg Hx    Stomach cancer Neg Hx     Past Surgical History:  Procedure Laterality Date   None     Social History   Occupational History   Not on file  Tobacco Use   Smoking status: Every Day    Packs/day: 0.50    Years: 13.00    Total pack years: 6.50    Types: Cigarettes    Passive exposure: Current   Smokeless tobacco: Never  Substance and Sexual Activity   Alcohol use: Yes    Alcohol/week: 2.0 standard drinks of alcohol    Types: 2 Standard drinks or equivalent per week    Comment: per week   Drug use: No   Sexual activity: Not Currently

## 2022-06-17 ENCOUNTER — Ambulatory Visit: Payer: BC Managed Care – PPO | Admitting: Physician Assistant

## 2022-06-18 ENCOUNTER — Encounter: Payer: BC Managed Care – PPO | Admitting: Family

## 2022-06-18 DIAGNOSIS — I10 Essential (primary) hypertension: Secondary | ICD-10-CM

## 2022-06-18 DIAGNOSIS — E785 Hyperlipidemia, unspecified: Secondary | ICD-10-CM

## 2022-06-23 NOTE — Progress Notes (Signed)
Patient ID: Justin Burnett, male    DOB: 03-02-1966  MRN: [id number]  CC: Chronic Care Management   Subjective: Justin Burnett is a 57 y.o. male who presents for chronic care management.   His concerns today include:  HTN - Amlodipine  HLD - Atorvastatin   Patient Active Problem List   Diagnosis Date Noted   Osteoarthritis of knees, bilateral 06/13/2022   Paresthesia 03/13/2022   Chronic bilateral low back pain without sciatica 03/13/2022   Hyperlipidemia 10/17/2021   Prediabetes 10/17/2021   Essential (primary) hypertension 09/26/2021   Neuropathy    Osteoarthritis 11/03/2011     Current Outpatient Medications on File Prior to Visit  Medication Sig Dispense Refill   amLODipine (NORVASC) 10 MG tablet Take 1 tablet (10 mg total) by mouth daily. 90 tablet 0   atorvastatin (LIPITOR) 20 MG tablet Take 1 tablet (20 mg total) by mouth daily. 30 tablet 2   gabapentin (NEURONTIN) 800 MG tablet Take 1 tablet (800 mg total) by mouth 2 (two) times daily. 60 tablet 2   meloxicam (MOBIC) 15 MG tablet Take 1 tablet (15 mg total) by mouth daily. 30 tablet 0   OVER THE COUNTER MEDICATION Vitamin D 3 two capsules daily.     triamcinolone cream (KENALOG) 0.1 % Apply 1 Application topically 2 (two) times daily. 30 g 0   No current facility-administered medications on file prior to visit.    Allergies  Allergen Reactions   Meperidine Other (See Comments)    Causes seizure-like activities    Social History   Socioeconomic History   Marital status: Married    Spouse name: [name]    Number of children: 1   Years of education: GED   Highest education level: Not on file  Occupational History   Not on file  Tobacco Use   Smoking status: Every Day    Packs/day: 0.50    Years: 13.00    Total pack years: 6.50    Types: Cigarettes    Passive exposure: Current   Smokeless tobacco: Never  Substance and Sexual Activity   Alcohol use: Yes    Alcohol/week: 2.0 standard drinks of alcohol     Types: 2 Standard drinks or equivalent per week    Comment: per week   Drug use: No   Sexual activity: Not Currently  Other Topics Concern   Not on file  Social History Narrative   Patient lives at home with his wife ([name]).   Patient is unemployed   Engineer, drilling. School.   Right handed.    Caffeine coffee and mountain dew.   Social Determinants of Health   Financial Resource Strain: Not on file  Food Insecurity: Not on file  Transportation Needs: Not on file  Physical Activity: Not on file  Stress: Not on file  Social Connections: Not on file  Intimate Partner Violence: Not on file    Family History  Problem Relation Age of Onset   Colon cancer Neg Hx    Esophageal cancer Neg Hx    Rectal cancer Neg Hx    Stomach cancer Neg Hx     Past Surgical History:  Procedure Laterality Date   None      ROS: Review of Systems Negative except as stated above  PHYSICAL EXAM: There were no vitals taken for this visit.  Physical Exam  {male adult master:310786} {male adult master:310785}     Latest Ref Rng & Units 12/15/2021    2:55  PM 10/16/2021   11:12 AM 09/26/2021   12:00 AM  CMP  Glucose 70 - 99 mg/dL 86   92   BUN 6 - 24 mg/dL 15   10   Creatinine 0.76 - 1.27 mg/dL 0.97   0.85   Sodium 134 - 144 mmol/L 139   139   Potassium 3.5 - 5.2 mmol/L 4.1   4.1   Chloride 96 - 106 mmol/L 104   101   CO2 20 - 29 mmol/L 19   22   Calcium 8.7 - 10.2 mg/dL 10.0   9.4   Total Protein 6.0 - 8.5 g/dL 7.1  7.4    Total Bilirubin 0.0 - 1.2 mg/dL 0.3  0.3    Alkaline Phos 44 - 121 IU/L 81  91    AST 0 - 40 IU/L 23  29    ALT 0 - 44 IU/L 22  28     Lipid Panel     Component Value Date/Time   CHOL 210 (H) 03/18/2022 1615   TRIG 253 (H) 03/18/2022 1615   HDL 58 03/18/2022 1615   CHOLHDL 3.6 03/18/2022 1615   LDLCALC 109 (H) 03/18/2022 1615    CBC    Component Value Date/Time   WBC 6.4 04/21/2022 0845   RBC 4.68 04/21/2022 0845   HGB 15.6 04/21/2022 0845    HGB 15.5 12/15/2021 1455   HCT 44.2 04/21/2022 0845   HCT 42.9 12/15/2021 1455   PLT 245 04/21/2022 0845   PLT 212 12/15/2021 1455   MCV 94.4 04/21/2022 0845   MCV 95 12/15/2021 1455   MCH 33.3 (H) 04/21/2022 0845   MCHC 35.3 04/21/2022 0845   RDW 12.3 04/21/2022 0845   RDW 12.0 12/15/2021 1455    ASSESSMENT AND PLAN:  There are no diagnoses linked to this encounter.   Patient was given the opportunity to ask questions.  Patient verbalized understanding of the plan and was able to repeat key elements of the plan. Patient was given clear instructions to go to Emergency Department or return to medical center if symptoms don't improve, worsen, or new problems develop.The patient verbalized understanding.   No orders of the defined types were placed in this encounter.    Requested Prescriptions    No prescriptions requested or ordered in this encounter    No follow-ups on file.  Camillia Herter, NP

## 2022-06-24 ENCOUNTER — Ambulatory Visit (INDEPENDENT_AMBULATORY_CARE_PROVIDER_SITE_OTHER): Payer: BC Managed Care – PPO | Admitting: Family

## 2022-06-24 ENCOUNTER — Other Ambulatory Visit (HOSPITAL_COMMUNITY): Payer: Self-pay

## 2022-06-24 ENCOUNTER — Encounter: Payer: Self-pay | Admitting: Family

## 2022-06-24 ENCOUNTER — Other Ambulatory Visit (HOSPITAL_BASED_OUTPATIENT_CLINIC_OR_DEPARTMENT_OTHER): Payer: Self-pay

## 2022-06-24 VITALS — BP 132/80 | HR 83 | Temp 98.3°F | Resp 16 | Ht 70.98 in | Wt 230.0 lb

## 2022-06-24 DIAGNOSIS — E785 Hyperlipidemia, unspecified: Secondary | ICD-10-CM

## 2022-06-24 DIAGNOSIS — I10 Essential (primary) hypertension: Secondary | ICD-10-CM

## 2022-06-24 MED ORDER — ATORVASTATIN CALCIUM 20 MG PO TABS
20.0000 mg | ORAL_TABLET | Freq: Every day | ORAL | 2 refills | Status: DC
  Filled 2022-06-24: qty 30, 30d supply, fill #0
  Filled 2022-08-11: qty 30, 30d supply, fill #1
  Filled 2022-09-03: qty 30, 30d supply, fill #2

## 2022-06-24 MED ORDER — AMLODIPINE BESYLATE 10 MG PO TABS
10.0000 mg | ORAL_TABLET | Freq: Every day | ORAL | 2 refills | Status: DC
  Filled 2022-06-24 – 2022-09-03 (×2): qty 30, 30d supply, fill #0

## 2022-06-24 NOTE — Progress Notes (Signed)
Pt presents for chronic care management   -pt hit head on corner of table about 3 weeks ago and still has pain over right brow  -needs refill on Amlodipine 10mg  and Atorvastatin 20mg 

## 2022-06-25 LAB — LIPID PANEL
Chol/HDL Ratio: 1.9 ratio (ref 0.0–5.0)
Cholesterol, Total: 187 mg/dL (ref 100–199)
HDL: 98 mg/dL (ref >39)
LDL Chol Calc (NIH): 68 mg/dL (ref 0–99)
Triglycerides: 126 mg/dL (ref 0–149)
VLDL Cholesterol Cal: 21 mg/dL (ref 5–40)

## 2022-06-26 ENCOUNTER — Encounter: Payer: Self-pay | Admitting: Orthopaedic Surgery

## 2022-06-26 ENCOUNTER — Ambulatory Visit (INDEPENDENT_AMBULATORY_CARE_PROVIDER_SITE_OTHER): Payer: Self-pay

## 2022-06-26 ENCOUNTER — Other Ambulatory Visit (HOSPITAL_COMMUNITY): Payer: Self-pay

## 2022-06-26 ENCOUNTER — Ambulatory Visit: Payer: Self-pay

## 2022-06-26 ENCOUNTER — Ambulatory Visit (INDEPENDENT_AMBULATORY_CARE_PROVIDER_SITE_OTHER): Payer: Self-pay | Admitting: Orthopaedic Surgery

## 2022-06-26 VITALS — Ht 72.0 in | Wt 245.0 lb

## 2022-06-26 DIAGNOSIS — M1712 Unilateral primary osteoarthritis, left knee: Secondary | ICD-10-CM

## 2022-06-26 DIAGNOSIS — M1711 Unilateral primary osteoarthritis, right knee: Secondary | ICD-10-CM

## 2022-06-26 MED ORDER — MELOXICAM 15 MG PO TABS
15.0000 mg | ORAL_TABLET | Freq: Every day | ORAL | 3 refills | Status: DC
  Filled 2022-06-26: qty 30, 30d supply, fill #0

## 2022-06-26 NOTE — Progress Notes (Signed)
Office Visit Note   Patient: Justin Burnett           Date of Birth: Nov 14, 1965           MRN: [id number] Visit Date: 06/26/2022              Requested by: Camillia Herter, NP [facility] [place],  [place] [address] PCP: Camillia Herter, NP   Assessment & Plan: Visit Diagnoses:  1. Primary osteoarthritis of left knee   2. Primary osteoarthritis of right knee     Plan: Impression is severe bilateral knee degenerative joint disease secondary to Osteoarthritis.  Bone on bone joint space narrowing is seen on radiographs with significant varus alignment.  At this point, conservative treatments fail to provide any significant relief and the pain is severely affecting ADLs and quality of life.  Based on treatment options, the patient has elected to move forward with a knee replacement.  We have discussed the surgical risks that include but are not limited to infection, DVT, leg length discrepancy, stiffness, numbness, tingling, incomplete relief of pain.  Recovery and prognosis were also reviewed.   Patient will make arrangements to obtain health insurance prior to moving forward with surgery.  Once this has been accomplished he will call us back to schedule surgery.  He will let us know which knee he wants to have done first.  Anticoagulants: No antithrombotic Postop anticoagulation: Aspirin 81 mg Diabetic: No  Nickel allergy: No Prior DVT/PE: No Tobacco use: No Clearances needed for surgery: none Anticipated discharge dispo: Home with wife  Follow-Up Instructions: No follow-ups on file.   Orders:  Orders Placed This Encounter  Procedures   XR KNEE 3 VIEW RIGHT   XR KNEE 3 VIEW LEFT   Meds ordered this encounter  Medications   meloxicam (MOBIC) 15 MG tablet    Sig: Take 1 tablet (15 mg total) by mouth daily.    Dispense:  30 tablet    Refill:  3      Procedures: No procedures performed   Clinical Data: No additional findings.   Subjective: Chief  Complaint  Patient presents with   Right Knee - Pain   Left Knee - Pain    HPI Justin Burnett is a very pleasant 57 year old gentleman referral from [facility] for surgical consultation for total knee replacement.  Patient has severe end-stage osteoarthritis with varus deformity.  He has undergone extensive conservative management with medications and injections.  Review of Systems  Constitutional: Negative.   HENT: Negative.    Eyes: Negative.   Respiratory: Negative.    Cardiovascular: Negative.   Gastrointestinal: Negative.   Endocrine: Negative.   Genitourinary: Negative.   Skin: Negative.   Allergic/Immunologic: Negative.   Neurological: Negative.   Hematological: Negative.   Psychiatric/Behavioral: Negative.    All other systems reviewed and are negative.    Objective: Vital Signs: Ht 6' (1.829 m)   Wt 245 lb (111.1 kg)   BMI 33.23 kg/m   Physical Exam Vitals and nursing note reviewed.  Constitutional:      Appearance: He is well-developed.  Pulmonary:     Effort: Pulmonary effort is normal.  Abdominal:     Palpations: Abdomen is soft.  Skin:    General: Skin is warm.  Neurological:     Mental Status: He is alert and oriented to person, place, and time.  Psychiatric:        Behavior: Behavior normal.  Thought Content: Thought content normal.        Judgment: Judgment normal.     Ortho Exam Examination of the knees show patellofemoral crepitus and varus deformity and pain with range of motion.  Medial joint line tenderness. Specialty Comments:  No specialty comments available.  Imaging: XR KNEE 3 VIEW RIGHT  Result Date: 06/26/2022 X-rays demonstrate severe osteoarthritis.  Bone-on-bone joint space narrowing of medial compartment with varus deformity  XR KNEE 3 VIEW LEFT  Result Date: 06/26/2022 Advanced tricompartmental degenerative joint disease.  Bone-on-bone joint space narrowing of the medial compartment with varus deformity    PMFS  History: Patient Active Problem List   Diagnosis Date Noted   Osteoarthritis of knees, bilateral 06/13/2022   Paresthesia 03/13/2022   Chronic bilateral low back pain without sciatica 03/13/2022   Hyperlipidemia 10/17/2021   Prediabetes 10/17/2021   Essential (primary) hypertension 09/26/2021   Neuropathy    Osteoarthritis 11/03/2011   Past Medical History:  Diagnosis Date   Hypertension    Nerve damage    Neuromuscular disorder ([place])    Neuropathy     Family History  Problem Relation Age of Onset   Colon cancer Neg Hx    Esophageal cancer Neg Hx    Rectal cancer Neg Hx    Stomach cancer Neg Hx     Past Surgical History:  Procedure Laterality Date   None     Social History   Occupational History   Not on file  Tobacco Use   Smoking status: Every Day    Packs/day: 0.50    Years: 13.00    Total pack years: 6.50    Types: Cigarettes    Passive exposure: Current   Smokeless tobacco: Never  Substance and Sexual Activity   Alcohol use: Yes    Alcohol/week: 2.0 standard drinks of alcohol    Types: 2 Standard drinks or equivalent per week    Comment: per week   Drug use: No   Sexual activity: Not Currently

## 2022-07-11 ENCOUNTER — Telehealth: Payer: Self-pay | Admitting: Orthopaedic Surgery

## 2022-07-11 ENCOUNTER — Other Ambulatory Visit: Payer: Self-pay | Admitting: Physician Assistant

## 2022-07-11 ENCOUNTER — Other Ambulatory Visit (HOSPITAL_COMMUNITY): Payer: Self-pay

## 2022-07-11 MED ORDER — MELOXICAM 15 MG PO TABS
15.0000 mg | ORAL_TABLET | Freq: Every day | ORAL | 3 refills | Status: DC
  Filled 2022-07-11: qty 30, 30d supply, fill #0
  Filled 2022-08-11: qty 30, 30d supply, fill #1
  Filled 2022-09-11: qty 30, 30d supply, fill #2
  Filled 2022-10-10: qty 30, 30d supply, fill #3

## 2022-07-11 NOTE — Telephone Encounter (Signed)
sent

## 2022-07-11 NOTE — Telephone Encounter (Signed)
Pt called requesting a refill of meloxicam. Please send to pharmacy on file. Pt phone number is [phone].

## 2022-07-15 ENCOUNTER — Other Ambulatory Visit (HOSPITAL_COMMUNITY): Payer: Self-pay

## 2022-07-21 ENCOUNTER — Other Ambulatory Visit: Payer: Self-pay | Admitting: Family

## 2022-07-21 ENCOUNTER — Other Ambulatory Visit (HOSPITAL_COMMUNITY): Payer: Self-pay

## 2022-07-21 DIAGNOSIS — G609 Hereditary and idiopathic neuropathy, unspecified: Secondary | ICD-10-CM

## 2022-07-21 MED ORDER — GABAPENTIN 800 MG PO TABS
800.0000 mg | ORAL_TABLET | Freq: Two times a day (BID) | ORAL | 2 refills | Status: DC
  Filled 2022-07-21: qty 60, 30d supply, fill #0
  Filled 2022-08-11: qty 60, 30d supply, fill #1
  Filled 2022-09-17: qty 60, 30d supply, fill #2

## 2022-07-21 NOTE — Telephone Encounter (Signed)
Order complete. 

## 2022-07-22 ENCOUNTER — Other Ambulatory Visit (HOSPITAL_COMMUNITY): Payer: Self-pay

## 2022-07-28 ENCOUNTER — Other Ambulatory Visit (HOSPITAL_COMMUNITY): Payer: Self-pay

## 2022-08-11 ENCOUNTER — Other Ambulatory Visit (HOSPITAL_COMMUNITY): Payer: Self-pay

## 2022-08-15 ENCOUNTER — Other Ambulatory Visit (HOSPITAL_COMMUNITY): Payer: Self-pay

## 2022-08-18 ENCOUNTER — Other Ambulatory Visit (HOSPITAL_COMMUNITY): Payer: Self-pay

## 2022-09-03 ENCOUNTER — Other Ambulatory Visit (HOSPITAL_COMMUNITY): Payer: Self-pay

## 2022-09-11 ENCOUNTER — Other Ambulatory Visit (HOSPITAL_COMMUNITY): Payer: Self-pay

## 2022-09-17 ENCOUNTER — Other Ambulatory Visit (HOSPITAL_COMMUNITY): Payer: Self-pay

## 2022-09-17 NOTE — Progress Notes (Signed)
Patient ID: ACESON KUHLMANN, male    DOB: 1966-05-04  MRN: [id number]  CC: Chronic Care Management   Subjective: Justin Burnett is a 57 y.o. male who presents for chronic care management.   His concerns today include:  - Doing well on blood pressure medication, no issues/concerns. He denies red flag symptoms such as but not limited to chest pain, shortness of breath, worst headache of life, nausea/vomiting.  - Doing well on cholesterol medication, no issues/concerns.  - Intermittent left shoulder pain x 3 days. He denies recent trauma/injury and red flag symptoms. Reports he thinks may be due to lifting weights at the gym.  Patient Active Problem List   Diagnosis Date Noted   Osteoarthritis of knees, bilateral 06/13/2022   Paresthesia 03/13/2022   Chronic bilateral low back pain without sciatica 03/13/2022   Hyperlipidemia 10/17/2021   Prediabetes 10/17/2021   Essential (primary) hypertension 09/26/2021   Neuropathy    Osteoarthritis 11/03/2011     Current Outpatient Medications on File Prior to Visit  Medication Sig Dispense Refill   gabapentin (NEURONTIN) 800 MG tablet Take 1 tablet (800 mg total) by mouth 2 (two) times daily. 60 tablet 2   meloxicam (MOBIC) 15 MG tablet Take 1 tablet (15 mg total) by mouth daily. 30 tablet 3   OVER THE COUNTER MEDICATION Vitamin D 3 two capsules daily.     triamcinolone cream (KENALOG) 0.1 % Apply 1 Application topically 2 (two) times daily. 30 g 0   No current facility-administered medications on file prior to visit.    Allergies  Allergen Reactions   Meperidine Other (See Comments)    Causes seizure-like activities    Social History   Socioeconomic History   Marital status: Married    Spouse name: [name]    Number of children: 1   Years of education: GED   Highest education level: Not on file  Occupational History   Not on file  Tobacco Use   Smoking status: Every Day    Packs/day: 0.50    Years: 13.00    Additional pack  years: 0.00    Total pack years: 6.50    Types: Cigarettes    Passive exposure: Current   Smokeless tobacco: Never  Substance and Sexual Activity   Alcohol use: Yes    Alcohol/week: 2.0 standard drinks of alcohol    Types: 2 Standard drinks or equivalent per week    Comment: per week   Drug use: No   Sexual activity: Not Currently  Other Topics Concern   Not on file  Social History Narrative   Patient lives at home with his wife ([name]).   Patient is unemployed   Engineer, drilling. School.   Right handed.    Caffeine coffee and mountain dew.   Social Determinants of Health   Financial Resource Strain: Not on file  Food Insecurity: Not on file  Transportation Needs: Not on file  Physical Activity: Not on file  Stress: Not on file  Social Connections: Not on file  Intimate Partner Violence: Not on file    Family History  Problem Relation Age of Onset   Colon cancer Neg Hx    Esophageal cancer Neg Hx    Rectal cancer Neg Hx    Stomach cancer Neg Hx     Past Surgical History:  Procedure Laterality Date   None      ROS: Review of Systems Negative except as stated above  PHYSICAL EXAM: BP 121/81 (BP  Location: Right Arm, Patient Position: Sitting, Cuff Size: Large)   Pulse 78   Temp 98.5 F (36.9 C)   Resp 14   Ht 6' (1.829 m)   Wt 235 lb 3.2 oz (106.7 kg)   SpO2 97%   BMI 31.90 kg/m   Physical Exam HENT:     Head: Normocephalic and atraumatic.  Eyes:     Extraocular Movements: Extraocular movements intact.     Conjunctiva/sclera: Conjunctivae normal.     Pupils: Pupils are equal, round, and reactive to light.  Cardiovascular:     Rate and Rhythm: Normal rate and regular rhythm.     Pulses: Normal pulses.     Heart sounds: Normal heart sounds.  Pulmonary:     Effort: Pulmonary effort is normal.     Breath sounds: Normal breath sounds.  Musculoskeletal:     Right shoulder: Normal.     Left shoulder: Normal.     Right upper arm: Normal.      Left upper arm: Normal.     Right elbow: Normal.     Left elbow: Normal.     Right forearm: Normal.     Left forearm: Normal.     Right wrist: Normal.     Left wrist: Normal.     Right hand: Normal.     Left hand: Normal.     Cervical back: Normal range of motion and neck supple.  Neurological:     General: No focal deficit present.     Mental Status: He is alert and oriented to person, place, and time.  Psychiatric:        Mood and Affect: Mood normal.        Behavior: Behavior normal.     ASSESSMENT AND PLAN: 1. Primary hypertension - Continue Amlodipine as prescribed.  - Counseled on blood pressure goal of less than 130/80, low-sodium, DASH diet, medication compliance, and 150 minutes of moderate intensity exercise per week as tolerated. Counseled on medication adherence and adverse effects. - Follow-up with primary provider in 6 months or sooner if needed.  - amLODipine (NORVASC) 10 MG tablet; Take 1 tablet (10 mg total) by mouth daily.  Dispense: 90 tablet; Refill: 0  2. Hyperlipidemia, unspecified hyperlipidemia type - Continue Atorvastatin as prescribed.  - Follow-up with primary provider in 6 months or sooner if needed.  - atorvastatin (LIPITOR) 20 MG tablet; Take 1 tablet (20 mg total) by mouth daily.  Dispense: 90 tablet; Refill: 0  3. Prediabetes - Routine screening.  - POCT glycosylated hemoglobin (Hb A1C)  4. Acute pain of left shoulder - Patient declined pharmacological therapy.  - Patient declined diagnostic x-ray.  - Follow-up with primary provider as scheduled.    Patient was given the opportunity to ask questions.  Patient verbalized understanding of the plan and was able to repeat key elements of the plan. Patient was given clear instructions to go to Emergency Department or return to medical center if symptoms don't improve, worsen, or new problems develop.The patient verbalized understanding.   Orders Placed This Encounter  Procedures   POCT  glycosylated hemoglobin (Hb A1C)     Requested Prescriptions   Signed Prescriptions Disp Refills   amLODipine (NORVASC) 10 MG tablet 90 tablet 0    Sig: Take 1 tablet (10 mg total) by mouth daily.   atorvastatin (LIPITOR) 20 MG tablet 90 tablet 0    Sig: Take 1 tablet (20 mg total) by mouth daily.    Return in about 6 months (  around 03/25/2023) for Follow-Up or next available chronic care mgmt .  Rema Fendt, NP

## 2022-09-23 ENCOUNTER — Ambulatory Visit (INDEPENDENT_AMBULATORY_CARE_PROVIDER_SITE_OTHER): Payer: 59 | Admitting: Family

## 2022-09-23 ENCOUNTER — Other Ambulatory Visit (HOSPITAL_COMMUNITY): Payer: Self-pay

## 2022-09-23 ENCOUNTER — Encounter: Payer: Self-pay | Admitting: Family

## 2022-09-23 VITALS — BP 121/81 | HR 78 | Temp 98.5°F | Resp 14 | Ht 72.0 in | Wt 235.2 lb

## 2022-09-23 DIAGNOSIS — M25512 Pain in left shoulder: Secondary | ICD-10-CM

## 2022-09-23 DIAGNOSIS — E785 Hyperlipidemia, unspecified: Secondary | ICD-10-CM

## 2022-09-23 DIAGNOSIS — I10 Essential (primary) hypertension: Secondary | ICD-10-CM

## 2022-09-23 DIAGNOSIS — R7303 Prediabetes: Secondary | ICD-10-CM | POA: Diagnosis not present

## 2022-09-23 LAB — POCT GLYCOSYLATED HEMOGLOBIN (HGB A1C): Hemoglobin A1C: 5.4 % (ref 4.0–5.6)

## 2022-09-23 MED ORDER — AMLODIPINE BESYLATE 10 MG PO TABS
10.0000 mg | ORAL_TABLET | Freq: Every day | ORAL | 0 refills | Status: DC
  Filled 2022-09-23 – 2022-10-03 (×2): qty 90, 90d supply, fill #0
  Filled 2022-10-03: qty 30, 30d supply, fill #0
  Filled 2022-11-04: qty 30, 30d supply, fill #1
  Filled 2022-12-02: qty 30, 30d supply, fill #2

## 2022-09-23 MED ORDER — ATORVASTATIN CALCIUM 20 MG PO TABS
20.0000 mg | ORAL_TABLET | Freq: Every day | ORAL | 0 refills | Status: DC
  Filled 2022-09-23: qty 90, 90d supply, fill #0
  Filled 2022-10-10: qty 30, 30d supply, fill #0
  Filled 2022-11-12: qty 30, 30d supply, fill #1
  Filled 2022-12-15: qty 30, 30d supply, fill #2

## 2022-09-23 NOTE — Progress Notes (Signed)
Pt is here for chronic care mgt  Complaining of left shoulder pain for X3 days No known injury to same

## 2022-09-23 NOTE — Addendum Note (Signed)
Addended by: Marcelle Overlie on: 09/23/2022 10:31 AM   Modules accepted: Orders

## 2022-09-25 ENCOUNTER — Ambulatory Visit: Payer: 59 | Admitting: Orthopaedic Surgery

## 2022-09-25 DIAGNOSIS — M1711 Unilateral primary osteoarthritis, right knee: Secondary | ICD-10-CM | POA: Diagnosis not present

## 2022-09-25 DIAGNOSIS — M1712 Unilateral primary osteoarthritis, left knee: Secondary | ICD-10-CM

## 2022-09-25 MED ORDER — BUPIVACAINE HCL 0.5 % IJ SOLN
2.0000 mL | INTRAMUSCULAR | Status: AC | PRN
Start: 2022-09-25 — End: 2022-09-25
  Administered 2022-09-25: 2 mL via INTRA_ARTICULAR

## 2022-09-25 MED ORDER — METHYLPREDNISOLONE ACETATE 40 MG/ML IJ SUSP
40.0000 mg | INTRAMUSCULAR | Status: AC | PRN
Start: 2022-09-25 — End: 2022-09-25
  Administered 2022-09-25: 40 mg via INTRA_ARTICULAR

## 2022-09-25 MED ORDER — LIDOCAINE HCL 1 % IJ SOLN
2.0000 mL | INTRAMUSCULAR | Status: AC | PRN
Start: 2022-09-25 — End: 2022-09-25
  Administered 2022-09-25: 2 mL

## 2022-09-25 NOTE — Progress Notes (Signed)
Office Visit Note   Patient: Justin Burnett           Date of Birth: 1965/11/20           MRN: [id number] Visit Date: 09/25/2022              Requested by: Rema Fendt, NP [address] [place],  [place] [address] PCP: Rema Fendt, NP   Assessment & Plan: Visit Diagnoses:  1. Primary osteoarthritis of left knee   2. Primary osteoarthritis of right knee     Plan: Impression 57 year old gentleman with end-stage varus DJD and large joint effusions.  Both knees were aspirated and injected with steroids today.  Patient will follow-up as needed.  He will make arrangements to do knee replacement surgery once he has better health insurance.  Follow-Up Instructions: No follow-ups on file.   Orders:  No orders of the defined types were placed in this encounter.  No orders of the defined types were placed in this encounter.     Procedures: Large Joint Inj: bilateral knee on 09/25/2022 9:57 AM Indications: pain Details: 22 G needle  Arthrogram: No  Medications (Right): 2 mL lidocaine 1 %; 2 mL bupivacaine 0.5 %; 40 mg methylPREDNISolone acetate 40 MG/ML Aspirate (Right): 90 mL Medications (Left): 2 mL lidocaine 1 %; 2 mL bupivacaine 0.5 %; 40 mg methylPREDNISolone acetate 40 MG/ML Aspirate (Left): 150 mL Outcome: tolerated well, no immediate complications Patient was prepped and draped in the usual sterile fashion.       Clinical Data: No additional findings.   Subjective: Chief Complaint  Patient presents with   Right Knee - Pain   Left Knee - Pain    HPI  Justin Burnett is a 57 year old gentleman who is well-known to me who comes in for follow-up of end-stage bilateral DJD with varus deformity.  His knee has swelled back up and requesting aspirations.  Denies any changes in medical history.  Review of Systems   Objective: Vital Signs: There were no vitals taken for this visit.  Physical Exam  Ortho Exam  Examination bilateral knees Burnett varus  deformity.  Medial joint line tenderness.  Large joint effusions.  Specialty Comments:  No specialty comments available.  Imaging: No results found.   PMFS History: Patient Active Problem List   Diagnosis Date Noted   Osteoarthritis of knees, bilateral 06/13/2022   Paresthesia 03/13/2022   Chronic bilateral low back pain without sciatica 03/13/2022   Hyperlipidemia 10/17/2021   Prediabetes 10/17/2021   Essential (primary) hypertension 09/26/2021   Neuropathy    Osteoarthritis 11/03/2011   Past Medical History:  Diagnosis Date   Hypertension    Nerve damage    Neuromuscular disorder    Neuropathy     Family History  Problem Relation Age of Onset   Colon cancer Neg Hx    Esophageal cancer Neg Hx    Rectal cancer Neg Hx    Stomach cancer Neg Hx     Past Surgical History:  Procedure Laterality Date   None     Social History   Occupational History   Not on file  Tobacco Use   Smoking status: Every Day    Packs/day: 0.50    Years: 13.00    Additional pack years: 0.00    Total pack years: 6.50    Types: Cigarettes    Passive exposure: Current   Smokeless tobacco: Never  Substance and Sexual Activity   Alcohol use: Yes  Alcohol/week: 2.0 standard drinks of alcohol    Types: 2 Standard drinks or equivalent per week    Comment: per week   Drug use: No   Sexual activity: Not Currently

## 2022-10-03 ENCOUNTER — Other Ambulatory Visit (HOSPITAL_COMMUNITY): Payer: Self-pay

## 2022-10-03 ENCOUNTER — Other Ambulatory Visit: Payer: Self-pay

## 2022-10-10 ENCOUNTER — Other Ambulatory Visit (HOSPITAL_COMMUNITY): Payer: Self-pay

## 2022-10-17 ENCOUNTER — Other Ambulatory Visit (HOSPITAL_COMMUNITY): Payer: Self-pay

## 2022-10-17 ENCOUNTER — Other Ambulatory Visit: Payer: Self-pay | Admitting: Family

## 2022-10-17 DIAGNOSIS — G609 Hereditary and idiopathic neuropathy, unspecified: Secondary | ICD-10-CM

## 2022-10-17 MED ORDER — GABAPENTIN 800 MG PO TABS
800.0000 mg | ORAL_TABLET | Freq: Two times a day (BID) | ORAL | 0 refills | Status: DC
Start: 2022-10-17 — End: 2022-11-18
  Filled 2022-10-17: qty 60, 30d supply, fill #0

## 2022-10-17 NOTE — Telephone Encounter (Signed)
Last dispensed 09/17/22. Future visit in 5 months .  Requested Prescriptions  Pending Prescriptions Disp Refills   gabapentin (NEURONTIN) 800 MG tablet 60 tablet 0    Sig: Take 1 tablet (800 mg total) by mouth 2 (two) times daily.     Neurology: Anticonvulsants - gabapentin Passed - 10/17/2022 12:47 PM      Passed - Cr in normal range and within 360 days    Creatinine, Ser  Date Value Ref Range Status  12/15/2021 0.97 0.76 - 1.27 mg/dL Final         Passed - Completed PHQ-2 or PHQ-9 in the last 360 days      Passed - Valid encounter within last 12 months    Recent Outpatient Visits           3 weeks ago Primary hypertension   [place] [facility], [place], NP   3 months ago Primary hypertension   [place] [facility], Amy J, NP   7 months ago Essential (primary) hypertension   [place] [facility], Amy J, NP   10 months ago Essential (primary) hypertension   [place] [facility], [place], NP   1 year ago Annual physical exam    [facility], Salomon Fick, NP       Future Appointments             In 5 months Rema Fendt, NP [facility]

## 2022-10-18 ENCOUNTER — Other Ambulatory Visit (HOSPITAL_COMMUNITY): Payer: Self-pay

## 2022-10-20 ENCOUNTER — Other Ambulatory Visit (HOSPITAL_COMMUNITY): Payer: Self-pay

## 2022-11-04 ENCOUNTER — Other Ambulatory Visit (HOSPITAL_COMMUNITY): Payer: Self-pay

## 2022-11-12 ENCOUNTER — Other Ambulatory Visit: Payer: Self-pay | Admitting: Physician Assistant

## 2022-11-12 ENCOUNTER — Telehealth: Payer: Self-pay | Admitting: Orthopaedic Surgery

## 2022-11-12 ENCOUNTER — Other Ambulatory Visit (HOSPITAL_COMMUNITY): Payer: Self-pay

## 2022-11-12 MED ORDER — MELOXICAM 15 MG PO TABS
15.0000 mg | ORAL_TABLET | Freq: Every day | ORAL | 3 refills | Status: DC
  Filled 2022-11-12: qty 30, 30d supply, fill #0
  Filled 2022-12-15: qty 30, 30d supply, fill #1
  Filled 2023-01-06 – 2023-01-08 (×2): qty 30, 30d supply, fill #2
  Filled 2023-02-05: qty 30, 30d supply, fill #3

## 2022-11-12 NOTE — Telephone Encounter (Signed)
Patient called. Would like meloxicam called in. His call back number is [phone]

## 2022-11-12 NOTE — Telephone Encounter (Signed)
sent 

## 2022-11-17 ENCOUNTER — Other Ambulatory Visit (HOSPITAL_COMMUNITY): Payer: Self-pay

## 2022-11-17 ENCOUNTER — Other Ambulatory Visit (HOSPITAL_BASED_OUTPATIENT_CLINIC_OR_DEPARTMENT_OTHER): Payer: Self-pay

## 2022-11-18 ENCOUNTER — Other Ambulatory Visit (HOSPITAL_COMMUNITY): Payer: Self-pay

## 2022-11-18 ENCOUNTER — Other Ambulatory Visit: Payer: Self-pay | Admitting: *Deleted

## 2022-11-18 DIAGNOSIS — G609 Hereditary and idiopathic neuropathy, unspecified: Secondary | ICD-10-CM

## 2022-11-18 MED ORDER — GABAPENTIN 800 MG PO TABS
800.0000 mg | ORAL_TABLET | Freq: Two times a day (BID) | ORAL | 0 refills | Status: DC
Start: 2022-11-18 — End: 2022-12-15
  Filled 2022-11-18: qty 60, 30d supply, fill #0

## 2022-12-03 ENCOUNTER — Other Ambulatory Visit (HOSPITAL_COMMUNITY): Payer: Self-pay

## 2022-12-09 ENCOUNTER — Telehealth: Payer: Self-pay | Admitting: Orthopaedic Surgery

## 2022-12-15 ENCOUNTER — Other Ambulatory Visit (HOSPITAL_COMMUNITY): Payer: Self-pay

## 2022-12-15 ENCOUNTER — Other Ambulatory Visit: Payer: Self-pay | Admitting: Family

## 2022-12-15 DIAGNOSIS — G609 Hereditary and idiopathic neuropathy, unspecified: Secondary | ICD-10-CM

## 2022-12-15 MED ORDER — GABAPENTIN 800 MG PO TABS
800.0000 mg | ORAL_TABLET | Freq: Two times a day (BID) | ORAL | 0 refills | Status: DC
Start: 2022-12-15 — End: 2023-01-09
  Filled 2022-12-15: qty 60, 30d supply, fill #0

## 2023-01-05 ENCOUNTER — Other Ambulatory Visit: Payer: Self-pay | Admitting: Family

## 2023-01-05 ENCOUNTER — Other Ambulatory Visit (HOSPITAL_COMMUNITY): Payer: Self-pay

## 2023-01-05 DIAGNOSIS — E785 Hyperlipidemia, unspecified: Secondary | ICD-10-CM

## 2023-01-05 DIAGNOSIS — I10 Essential (primary) hypertension: Secondary | ICD-10-CM

## 2023-01-05 NOTE — Telephone Encounter (Signed)
Medication Refill - Medication: \amLODipine (NORVASC) 10 MG tablet  atorvastatin (LIPITOR) 20 MG tablet   Has the patient contacted their pharmacy? Yes.  Advised patient to contact PCP, pharmacy said they have not received any prescriptions.  Preferred Pharmacy (with phone number or street name):  [facility]  Phone: [phone] Fax: [phone]  Has the patient been seen for an appointment in the last year OR does the patient have an upcoming appointment? Yes.  F/U on 10.9.24 with PCP   Patients callback # [phone]

## 2023-01-06 ENCOUNTER — Other Ambulatory Visit (HOSPITAL_COMMUNITY): Payer: Self-pay

## 2023-01-06 ENCOUNTER — Encounter (HOSPITAL_COMMUNITY): Payer: Self-pay | Admitting: Pharmacist

## 2023-01-06 MED ORDER — AMLODIPINE BESYLATE 10 MG PO TABS
10.0000 mg | ORAL_TABLET | Freq: Every day | ORAL | 0 refills | Status: DC
Start: 2023-01-06 — End: 2023-03-25
  Filled 2023-01-06: qty 30, 30d supply, fill #0
  Filled 2023-02-05: qty 30, 30d supply, fill #1
  Filled 2023-03-04: qty 30, 30d supply, fill #2

## 2023-01-06 MED ORDER — ATORVASTATIN CALCIUM 20 MG PO TABS
20.0000 mg | ORAL_TABLET | Freq: Every day | ORAL | 1 refills | Status: DC
Start: 2023-01-06 — End: 2023-03-25
  Filled 2023-01-06 – 2023-01-08 (×2): qty 30, 30d supply, fill #0
  Filled 2023-02-12: qty 30, 30d supply, fill #1
  Filled 2023-03-10: qty 30, 30d supply, fill #2

## 2023-01-06 NOTE — Telephone Encounter (Signed)
Requested Prescriptions  Pending Prescriptions Disp Refills   amLODipine (NORVASC) 10 MG tablet 90 tablet 0    Sig: Take 1 tablet (10 mg total) by mouth daily.     Cardiovascular: Calcium Channel Blockers 2 Passed - 01/05/2023 11:47 AM      Passed - Last BP in normal range    BP Readings from Last 1 Encounters:  09/23/22 121/81         Passed - Last Heart Rate in normal range    Pulse Readings from Last 1 Encounters:  09/23/22 78         Passed - Valid encounter within last 6 months    Recent Outpatient Visits           3 months ago Primary hypertension   [place] [facility], [place], [place]   6 months ago Primary hypertension   [place] [facility], Amy J, NP   9 months ago Essential (primary) hypertension   [place] [facility], [place], [place]   1 year ago Essential (primary) hypertension   [place] [facility] at [facility], [place], [place]   1 year ago Annual physical exam   [place] [facility], Amy J, NP       Future Appointments             In 2 months Rema Fendt, NP [facility]             atorvastatin (LIPITOR) 20 MG tablet 90 tablet 1    Sig: Take 1 tablet (20 mg total) by mouth daily.     Cardiovascular:  Antilipid - Statins Failed - 01/05/2023 11:47 AM      Failed - Lipid Panel in normal range within the last 12 months    Cholesterol, Total  Date Value Ref Range Status  06/24/2022 187 100 - 199 mg/dL Final   LDL Chol Calc (NIH)  Date Value Ref Range Status  06/24/2022 68 0 - 99 mg/dL Final   HDL  Date Value Ref Range Status  06/24/2022 98 >39 mg/dL Final   Triglycerides  Date Value Ref Range Status  06/24/2022 126 0 - 149 mg/dL Final         Passed - Patient is not pregnant      Passed - Valid encounter within last 12 months    Recent Outpatient Visits           3 months ago  Primary hypertension   [place] [facility], Amy J, NP   6 months ago Primary hypertension   [place] [facility], Amy J, NP   9 months ago Essential (primary) hypertension   [place] [facility], [place], [place]   1 year ago Essential (primary) hypertension   [place] [facility] at [facility], [place], [place]   1 year ago Annual physical exam   [place] [facility], Salomon Fick, NP       Future Appointments             In 2 months Rema Fendt, NP [facility] at [facility]

## 2023-01-08 ENCOUNTER — Other Ambulatory Visit: Payer: Self-pay

## 2023-01-08 ENCOUNTER — Other Ambulatory Visit (HOSPITAL_COMMUNITY): Payer: Self-pay

## 2023-01-09 ENCOUNTER — Other Ambulatory Visit: Payer: Self-pay | Admitting: Family

## 2023-01-09 ENCOUNTER — Other Ambulatory Visit (HOSPITAL_COMMUNITY): Payer: Self-pay

## 2023-01-09 DIAGNOSIS — G609 Hereditary and idiopathic neuropathy, unspecified: Secondary | ICD-10-CM

## 2023-01-09 NOTE — Telephone Encounter (Signed)
Requested medication (s) are due for refill today: Yes  Requested medication (s) are on the active medication list: Yes  Last refill:  12/15/22 #60 0RF  Future visit scheduled: Yes  Notes to clinic:  Unable to refill per protocol due to failed labs, no updated results.      Requested Prescriptions  Pending Prescriptions Disp Refills   gabapentin (NEURONTIN) 800 MG tablet 60 tablet 0    Sig: Take 1 tablet (800 mg total) by mouth 2 (two) times daily.     Neurology: Anticonvulsants - gabapentin Failed - 01/09/2023  4:39 PM      Failed - Cr in normal range and within 360 days    Creatinine, Ser  Date Value Ref Range Status  12/15/2021 0.97 0.76 - 1.27 mg/dL Final         Passed - Completed PHQ-2 or PHQ-9 in the last 360 days      Passed - Valid encounter within last 12 months    Recent Outpatient Visits           3 months ago Primary hypertension   [place] [facility], [place], [place]   6 months ago Primary hypertension   [place] [facility], Amy J, NP   9 months ago Essential (primary) hypertension   [place] [facility] at [facility], [place], [place]   1 year ago Essential (primary) hypertension   [place] [facility], [place], [place]   1 year ago Annual physical exam   [place] [facility] at [facility], Salomon Fick, NP       Future Appointments             In 2 months Rema Fendt, NP [facility] at [facility]

## 2023-01-13 ENCOUNTER — Other Ambulatory Visit (HOSPITAL_COMMUNITY): Payer: Self-pay

## 2023-01-13 ENCOUNTER — Other Ambulatory Visit: Payer: Self-pay | Admitting: Family

## 2023-01-13 DIAGNOSIS — G609 Hereditary and idiopathic neuropathy, unspecified: Secondary | ICD-10-CM

## 2023-01-13 NOTE — Telephone Encounter (Signed)
Requested medication (s) are due for refill today: Yes  Requested medication (s) are on the active medication list: Yes  Last refill:  12/15/22  Future visit scheduled: Yes  Notes to clinic:  Unable to refill per protocol due to failed labs, no updated results.      Requested Prescriptions  Pending Prescriptions Disp Refills   gabapentin (NEURONTIN) 800 MG tablet 60 tablet 0    Sig: Take 1 tablet (800 mg total) by mouth 2 (two) times daily.     Neurology: Anticonvulsants - gabapentin Failed - 01/13/2023  4:12 PM      Failed - Cr in normal range and within 360 days    Creatinine, Ser  Date Value Ref Range Status  12/15/2021 0.97 0.76 - 1.27 mg/dL Final         Passed - Completed PHQ-2 or PHQ-9 in the last 360 days      Passed - Valid encounter within last 12 months    Recent Outpatient Visits           3 months ago Primary hypertension   [place] [facility], [place], [place]   6 months ago Primary hypertension   [place] [facility], Amy J, NP   10 months ago Essential (primary) hypertension   [place] [facility] at [facility], [place], [place]   1 year ago Essential (primary) hypertension   [place] [facility] at [facility], [place], [place]   1 year ago Annual physical exam   [place] [facility] at [facility], Salomon Fick, NP       Future Appointments             In 2 months Rema Fendt, NP [facility] at [facility]

## 2023-01-14 ENCOUNTER — Other Ambulatory Visit (HOSPITAL_COMMUNITY): Payer: Self-pay

## 2023-01-14 MED ORDER — GABAPENTIN 800 MG PO TABS
800.0000 mg | ORAL_TABLET | Freq: Two times a day (BID) | ORAL | 0 refills | Status: DC
  Filled 2023-01-14: qty 60, 30d supply, fill #0

## 2023-01-14 NOTE — Telephone Encounter (Signed)
 Complete. Schedule appointment.

## 2023-02-04 ENCOUNTER — Other Ambulatory Visit (HOSPITAL_COMMUNITY): Payer: Self-pay

## 2023-02-04 ENCOUNTER — Other Ambulatory Visit: Payer: Self-pay | Admitting: Family

## 2023-02-04 DIAGNOSIS — G609 Hereditary and idiopathic neuropathy, unspecified: Secondary | ICD-10-CM

## 2023-02-04 MED ORDER — GABAPENTIN 800 MG PO TABS
800.0000 mg | ORAL_TABLET | Freq: Two times a day (BID) | ORAL | 0 refills | Status: DC
Start: 2023-02-04 — End: 2023-03-08
  Filled 2023-02-04 – 2023-02-12 (×2): qty 60, 30d supply, fill #0

## 2023-02-04 NOTE — Telephone Encounter (Signed)
Complete

## 2023-02-05 ENCOUNTER — Other Ambulatory Visit (HOSPITAL_COMMUNITY): Payer: Self-pay

## 2023-02-12 ENCOUNTER — Other Ambulatory Visit (HOSPITAL_COMMUNITY): Payer: Self-pay

## 2023-02-12 ENCOUNTER — Other Ambulatory Visit: Payer: Self-pay

## 2023-03-08 ENCOUNTER — Other Ambulatory Visit: Payer: Self-pay | Admitting: Physician Assistant

## 2023-03-08 ENCOUNTER — Other Ambulatory Visit: Payer: Self-pay | Admitting: Family

## 2023-03-08 DIAGNOSIS — G609 Hereditary and idiopathic neuropathy, unspecified: Secondary | ICD-10-CM

## 2023-03-09 ENCOUNTER — Other Ambulatory Visit: Payer: Self-pay

## 2023-03-09 ENCOUNTER — Other Ambulatory Visit (HOSPITAL_COMMUNITY): Payer: Self-pay

## 2023-03-09 MED ORDER — GABAPENTIN 800 MG PO TABS
800.0000 mg | ORAL_TABLET | Freq: Two times a day (BID) | ORAL | 0 refills | Status: DC
  Filled 2023-03-09: qty 60, 30d supply, fill #0

## 2023-03-10 ENCOUNTER — Other Ambulatory Visit (HOSPITAL_COMMUNITY): Payer: Self-pay

## 2023-03-13 ENCOUNTER — Other Ambulatory Visit: Payer: Self-pay | Admitting: Physician Assistant

## 2023-03-13 ENCOUNTER — Other Ambulatory Visit (HOSPITAL_COMMUNITY): Payer: Self-pay

## 2023-03-13 ENCOUNTER — Telehealth: Payer: Self-pay | Admitting: Orthopaedic Surgery

## 2023-03-13 MED ORDER — MELOXICAM 15 MG PO TABS
15.0000 mg | ORAL_TABLET | Freq: Every day | ORAL | 3 refills | Status: DC
  Filled 2023-03-13: qty 30, 30d supply, fill #0
  Filled 2023-04-08: qty 30, 30d supply, fill #1
  Filled 2023-05-05: qty 30, 30d supply, fill #2
  Filled 2023-06-03: qty 30, 30d supply, fill #3

## 2023-03-13 NOTE — Telephone Encounter (Signed)
Patient called advised the  Meloxicam is pending approval before the pharmacy will fill the Rx. The number to contact patient is  [phone]

## 2023-03-13 NOTE — Telephone Encounter (Signed)
sent 

## 2023-03-25 ENCOUNTER — Other Ambulatory Visit (HOSPITAL_COMMUNITY): Payer: Self-pay

## 2023-03-25 ENCOUNTER — Ambulatory Visit (INDEPENDENT_AMBULATORY_CARE_PROVIDER_SITE_OTHER): Payer: Self-pay | Admitting: Family

## 2023-03-25 ENCOUNTER — Encounter: Payer: Self-pay | Admitting: Family

## 2023-03-25 VITALS — BP 131/86 | HR 76 | Temp 97.9°F | Resp 16 | Wt 232.6 lb

## 2023-03-25 DIAGNOSIS — W19XXXA Unspecified fall, initial encounter: Secondary | ICD-10-CM

## 2023-03-25 DIAGNOSIS — S4992XA Unspecified injury of left shoulder and upper arm, initial encounter: Secondary | ICD-10-CM

## 2023-03-25 DIAGNOSIS — Z87898 Personal history of other specified conditions: Secondary | ICD-10-CM

## 2023-03-25 DIAGNOSIS — I10 Essential (primary) hypertension: Secondary | ICD-10-CM

## 2023-03-25 DIAGNOSIS — E785 Hyperlipidemia, unspecified: Secondary | ICD-10-CM

## 2023-03-25 DIAGNOSIS — S3991XA Unspecified injury of abdomen, initial encounter: Secondary | ICD-10-CM

## 2023-03-25 MED ORDER — AMLODIPINE BESYLATE 10 MG PO TABS
10.0000 mg | ORAL_TABLET | Freq: Every day | ORAL | 0 refills | Status: DC
Start: 2023-03-25 — End: 2023-07-03
  Filled 2023-03-25: qty 90, 90d supply, fill #0
  Filled 2023-04-04: qty 30, 30d supply, fill #0
  Filled 2023-05-05: qty 30, 30d supply, fill #1
  Filled 2023-06-03: qty 30, 30d supply, fill #2

## 2023-03-25 MED ORDER — ATORVASTATIN CALCIUM 20 MG PO TABS
20.0000 mg | ORAL_TABLET | Freq: Every day | ORAL | 0 refills | Status: DC
Start: 2023-03-25 — End: 2023-07-23
  Filled 2023-03-25: qty 90, 90d supply, fill #0
  Filled 2023-04-08: qty 30, 30d supply, fill #0
  Filled 2023-05-05: qty 30, 30d supply, fill #1
  Filled 2023-06-03: qty 30, 30d supply, fill #2

## 2023-03-25 MED ORDER — SILVER SULFADIAZINE 1 % EX CREA
1.0000 | TOPICAL_CREAM | Freq: Two times a day (BID) | CUTANEOUS | 0 refills | Status: DC
  Filled 2023-03-25: qty 50, 30d supply, fill #0

## 2023-03-25 MED ORDER — AMOXICILLIN-POT CLAVULANATE 875-125 MG PO TABS
1.0000 | ORAL_TABLET | Freq: Two times a day (BID) | ORAL | 0 refills | Status: DC
Start: 2023-03-25 — End: 2023-10-27
  Filled 2023-03-25: qty 20, 10d supply, fill #0

## 2023-03-25 NOTE — Progress Notes (Signed)
Patient is here for their 6 month follow-up Patient fell and would provider to look at arm Care gaps have been discussed with patient

## 2023-03-25 NOTE — Progress Notes (Signed)
Patient ID: Justin Burnett, male    DOB: 1965/06/23  MRN: [id number]  CC: Chronic Conditions Follow-Up  Subjective: Justin Burnett is a 57 y.o. male who presents for chronic conditions follow-up.   His concerns today include:  - Doing well on Amlodipine, no issues/concerns. He does not complain of red flag symptoms such as but not limited to chest pain, shortness of breath, worst headache of life, nausea/vomiting.  - Doing well on Atorvastatin, no issues/concerns.  - States he fell over tree roots onto his grill last night while cooking pork chops. He did not hit his head. He did not seek immediate medical evaluation per his preference. As a result of his fall the left arm and left side of flank had bleeding and bruising. He wrapped both sites with gauze and tape. He declines xray on today. States he does not think he anything is broken but would like dressing change.   Patient Active Problem List   Diagnosis Date Noted   Osteoarthritis of knees, bilateral 06/13/2022   Paresthesia 03/13/2022   Chronic bilateral low back pain without sciatica 03/13/2022   Hyperlipidemia 10/17/2021   Prediabetes 10/17/2021   Essential (primary) hypertension 09/26/2021   Neuropathy    Osteoarthritis 11/03/2011     Current Outpatient Medications on File Prior to Visit  Medication Sig Dispense Refill   gabapentin (NEURONTIN) 800 MG tablet Take 1 tablet (800 mg total) by mouth 2 (two) times daily. 60 tablet 0   meloxicam (MOBIC) 15 MG tablet Take 1 tablet (15 mg total) by mouth daily. 30 tablet 3   naproxen (NAPROSYN) 500 MG tablet Take by mouth.     OVER THE COUNTER MEDICATION Vitamin D 3 two capsules daily.     triamcinolone cream (KENALOG) 0.1 % Apply 1 Application topically 2 (two) times daily. 30 g 0   No current facility-administered medications on file prior to visit.    Allergies  Allergen Reactions   Meperidine Other (See Comments)    Causes seizure-like activities   Meperidine Hcl Other  (See Comments)    Social History   Socioeconomic History   Marital status: Married    Spouse name: Justin Burnett    Number of children: 1   Years of education: GED   Highest education level: Not on file  Occupational History   Not on file  Tobacco Use   Smoking status: Every Day    Current packs/day: 0.50    Average packs/day: 0.5 packs/day for 13.0 years (6.5 ttl pk-yrs)    Types: Cigarettes    Passive exposure: Current   Smokeless tobacco: Never  Substance and Sexual Activity   Alcohol use: Yes    Alcohol/week: 2.0 standard drinks of alcohol    Types: 2 Standard drinks or equivalent per week    Comment: per week   Drug use: No   Sexual activity: Not Currently  Other Topics Concern   Not on file  Social History Narrative   Patient lives at home with his wife (Justin Burnett).   Patient is unemployed   Engineer, drilling. School.   Right handed.    Caffeine coffee and mountain dew.   Social Determinants of Health   Financial Resource Strain: Not on file  Food Insecurity: Not on file  Transportation Needs: Not on file  Physical Activity: Not on file  Stress: Not on file  Social Connections: Not on file  Intimate Partner Violence: Not on file    Family History  Problem Relation Age  of Onset   Colon cancer Neg Hx    Esophageal cancer Neg Hx    Rectal cancer Neg Hx    Stomach cancer Neg Hx     Past Surgical History:  Procedure Laterality Date   None      ROS: Review of Systems Negative except as stated above  PHYSICAL EXAM: BP 131/86   Pulse 76   Temp 97.9 F (36.6 C) (Oral)   Resp 16   Wt 232 lb 9.6 oz (105.5 kg)   SpO2 97%   BMI 31.55 kg/m   Physical Exam HENT:     Head: Normocephalic and atraumatic.     Nose: Nose normal.     Mouth/Throat:     Mouth: Mucous membranes are moist.     Pharynx: Oropharynx is clear.  Eyes:     Extraocular Movements: Extraocular movements intact.     Conjunctiva/sclera: Conjunctivae normal.     Pupils: Pupils are equal,  round, and reactive to light.  Cardiovascular:     Rate and Rhythm: Normal rate and regular rhythm.     Pulses: Normal pulses.     Heart sounds: Normal heart sounds.  Pulmonary:     Effort: Pulmonary effort is normal.     Breath sounds: Normal breath sounds.  Musculoskeletal:        General: Normal range of motion.     Right shoulder: Normal.     Left shoulder: Normal.     Right upper arm: Normal.     Left upper arm: Normal.     Right elbow: Normal.     Left elbow: Normal.     Right forearm: Normal.     Left forearm: Normal.     Right wrist: Normal.     Left wrist: Normal.     Right hand: Normal.     Left hand: Normal.     Cervical back: Normal, normal range of motion and neck supple.     Thoracic back: Normal.     Lumbar back: Normal.     Right hip: Normal.     Left hip: Normal.     Right upper leg: Normal.     Left upper leg: Normal.     Right knee: Normal.     Left knee: Normal.     Right lower leg: Normal.     Left lower leg: Normal.     Right ankle: Normal.     Left ankle: Normal.     Right foot: Normal.     Left foot: Normal.  Skin:    Comments: Left upper extremity with abrasion/bruising, no drainage, no additional presentation. Left flank with abrasion/bruising, no drainage, no additional presentation.   Neurological:     General: No focal deficit present.     Mental Status: He is alert and oriented to person, place, and time.  Psychiatric:        Mood and Affect: Mood normal.        Behavior: Behavior normal.    ASSESSMENT AND PLAN: Note - I discussed plan of care with Georganna Skeans, MD.   1. Primary hypertension - Continue Amlodipine as prescribed.  - Routine screening.  - Counseled on blood pressure goal of less than 130/80, low-sodium, DASH diet, medication compliance, and 150 minutes of moderate intensity exercise per week as tolerated. Counseled on medication adherence and adverse effects. - Follow-up with primary provider in 3 months or sooner if  needed.  - Basic Metabolic Panel - amLODipine (NORVASC) 10  MG tablet; Take 1 tablet (10 mg total) by mouth daily.  Dispense: 90 tablet; Refill: 0  2. Hyperlipidemia, unspecified hyperlipidemia type - Continue Atorvastatin as prescribed. Counseled on medication adherence/adverse effects.  - Follow-up with primary provider in 3 months or sooner if needed.  - atorvastatin (LIPITOR) 20 MG tablet; Take 1 tablet (20 mg total) by mouth daily.  Dispense: 90 tablet; Refill: 0  3. History of prediabetes - Routine screening.  - Hemoglobin A1c  4. Fall, initial encounter 5. Arm injury, left, initial encounter 6. Injury of flank, initial encounter - Silver Sulfadiazine cream and Amoxicillin-Clavulanate as prescribed. Counseled on medication adherence/adverse effects. - Patient declined diagnostic xray.  - Dressing change completed today in office by Jessie Foot, CMA.  - Follow-up with primary provider as scheduled. - silver sulfADIAZINE (SILVADENE) 1 % cream; Apply 1 Application topically 2 (two) times daily.  Dispense: 50 g; Refill: 0 - amoxicillin-clavulanate (AUGMENTIN) 875-125 MG tablet; Take 1 tablet by mouth 2 (two) times daily.  Dispense: 20 tablet; Refill: 0   Patient was given the opportunity to ask questions.  Patient verbalized understanding of the plan and was able to repeat key elements of the plan. Patient was given clear instructions to go to Emergency Department or return to medical center if symptoms don't improve, worsen, or new problems develop.The patient verbalized understanding.   Orders Placed This Encounter  Procedures   Basic Metabolic Panel   Hemoglobin A1c     Requested Prescriptions   Signed Prescriptions Disp Refills   amLODipine (NORVASC) 10 MG tablet 90 tablet 0    Sig: Take 1 tablet (10 mg total) by mouth daily.   atorvastatin (LIPITOR) 20 MG tablet 90 tablet 0    Sig: Take 1 tablet (20 mg total) by mouth daily.   silver sulfADIAZINE (SILVADENE) 1 %  cream 50 g 0    Sig: Apply 1 Application topically 2 (two) times daily.   amoxicillin-clavulanate (AUGMENTIN) 875-125 MG tablet 20 tablet 0    Sig: Take 1 tablet by mouth 2 (two) times daily.    Return in about 3 months (around 06/25/2023) for Follow-Up or next available chronic conditions.  Rema Fendt, NP

## 2023-03-26 ENCOUNTER — Other Ambulatory Visit: Payer: Self-pay | Admitting: Family

## 2023-03-26 DIAGNOSIS — Z87898 Personal history of other specified conditions: Secondary | ICD-10-CM

## 2023-03-26 LAB — BASIC METABOLIC PANEL
BUN/Creatinine Ratio: 14 (ref 9–20)
BUN: 12 mg/dL (ref 6–24)
CO2: 18 mmol/L — ABNORMAL LOW (ref 20–29)
Calcium: 9.4 mg/dL (ref 8.7–10.2)
Chloride: 102 mmol/L (ref 96–106)
Creatinine, Ser: 0.88 mg/dL (ref 0.76–1.27)
Glucose: 95 mg/dL (ref 70–99)
Potassium: 4.4 mmol/L (ref 3.5–5.2)
Sodium: 141 mmol/L (ref 134–144)
eGFR: 99 mL/min/{1.73_m2} (ref >59)

## 2023-03-26 LAB — HEMOGLOBIN A1C

## 2023-04-04 ENCOUNTER — Other Ambulatory Visit (HOSPITAL_COMMUNITY): Payer: Self-pay

## 2023-04-04 ENCOUNTER — Other Ambulatory Visit: Payer: Self-pay | Admitting: Family

## 2023-04-06 ENCOUNTER — Other Ambulatory Visit (HOSPITAL_COMMUNITY): Payer: Self-pay

## 2023-04-06 NOTE — Telephone Encounter (Signed)
Naproxen appears as historical medication. Schedule appointment.

## 2023-04-08 ENCOUNTER — Other Ambulatory Visit: Payer: Self-pay | Admitting: Family

## 2023-04-08 ENCOUNTER — Other Ambulatory Visit: Payer: Self-pay

## 2023-04-08 ENCOUNTER — Other Ambulatory Visit (HOSPITAL_COMMUNITY): Payer: Self-pay

## 2023-04-08 ENCOUNTER — Other Ambulatory Visit: Payer: Self-pay | Admitting: Family Medicine

## 2023-04-08 ENCOUNTER — Telehealth: Payer: Self-pay | Admitting: *Deleted

## 2023-04-08 DIAGNOSIS — S3991XA Unspecified injury of abdomen, initial encounter: Secondary | ICD-10-CM

## 2023-04-08 DIAGNOSIS — S4992XA Unspecified injury of left shoulder and upper arm, initial encounter: Secondary | ICD-10-CM

## 2023-04-08 DIAGNOSIS — W19XXXA Unspecified fall, initial encounter: Secondary | ICD-10-CM

## 2023-04-08 DIAGNOSIS — G609 Hereditary and idiopathic neuropathy, unspecified: Secondary | ICD-10-CM

## 2023-04-08 NOTE — Telephone Encounter (Signed)
Schedule appointment?

## 2023-04-08 NOTE — Telephone Encounter (Signed)
Please schedule appointment for further refills

## 2023-04-08 NOTE — Telephone Encounter (Signed)
Patient stated he would call office back to make appointment for medication refills once he finds out which refills he needs from [place]

## 2023-04-09 ENCOUNTER — Encounter (HOSPITAL_COMMUNITY): Payer: Self-pay

## 2023-04-09 ENCOUNTER — Other Ambulatory Visit (HOSPITAL_COMMUNITY): Payer: Self-pay

## 2023-04-09 ENCOUNTER — Other Ambulatory Visit: Payer: Self-pay | Admitting: Family Medicine

## 2023-04-09 ENCOUNTER — Other Ambulatory Visit: Payer: Self-pay | Admitting: Family

## 2023-04-09 ENCOUNTER — Encounter: Payer: Self-pay | Admitting: Family

## 2023-04-09 ENCOUNTER — Telehealth: Payer: Self-pay | Admitting: *Deleted

## 2023-04-09 DIAGNOSIS — G609 Hereditary and idiopathic neuropathy, unspecified: Secondary | ICD-10-CM

## 2023-04-09 MED ORDER — GABAPENTIN 800 MG PO TABS
800.0000 mg | ORAL_TABLET | Freq: Two times a day (BID) | ORAL | 0 refills | Status: DC
  Filled 2023-04-09: qty 60, 30d supply, fill #0

## 2023-04-09 NOTE — Telephone Encounter (Signed)
gabapentin (NEURONTIN) 800 MG tablet  Patient request

## 2023-04-09 NOTE — Telephone Encounter (Signed)
Pt called back saying the pharmacy called him with a denial saying he needed an appt but he was just in there last week.  I called the office and talked to front desk and she said there was a misunderstanding because Amy was not int he office and they would send it over this afternoon to the pharmacy.  I  relayed message to the patient.

## 2023-04-09 NOTE — Telephone Encounter (Signed)
Medication Refill - Medication: gabapentin (NEURONTIN) 800 MG tablet   Pt stated he was just seen 10/09 and he is out of his medication. He stated he needs this filled as soon as possible and took his last tablet today. He mentioned he does not understand why he is being told an appointment is needed if he was just seen.  Please advise.   Has the patient contacted their pharmacy? Yes.    (Agent: If yes, when and what did the pharmacy advise?)  Preferred Pharmacy (with phone number or street name):  [facility]  [address] [place] [address]  Phone: [phone] Fax: [phone]  Hours: M-F 7:30am-6pm   Has the patient been seen for an appointment in the last year OR does the patient have an upcoming appointment? Yes.    Agent: Please be advised that RX refills may take up to 3 business days. We ask that you follow-up with your pharmacy.

## 2023-04-09 NOTE — Telephone Encounter (Signed)
Pt stated he needs a refill on medication gabapentin (NEURONTIN) 800 MG tablet was just seen 10/09 and he is out of his medication. He stated he needs this filled as soon as possible and took his last tablet today. He mentioned he does not understand why he is being told an appointment is needed if he was just seen.  Please advise.

## 2023-04-14 NOTE — Telephone Encounter (Signed)
Gabapentin prescribed 04/09/2023.

## 2023-05-05 ENCOUNTER — Other Ambulatory Visit: Payer: Self-pay | Admitting: Family

## 2023-05-05 ENCOUNTER — Other Ambulatory Visit: Payer: Self-pay | Admitting: Family Medicine

## 2023-05-05 ENCOUNTER — Other Ambulatory Visit (HOSPITAL_COMMUNITY): Payer: Self-pay

## 2023-05-05 ENCOUNTER — Other Ambulatory Visit: Payer: Self-pay

## 2023-05-05 ENCOUNTER — Encounter (HOSPITAL_COMMUNITY): Payer: Self-pay

## 2023-05-05 DIAGNOSIS — W19XXXA Unspecified fall, initial encounter: Secondary | ICD-10-CM

## 2023-05-05 DIAGNOSIS — S3991XA Unspecified injury of abdomen, initial encounter: Secondary | ICD-10-CM

## 2023-05-05 DIAGNOSIS — G609 Hereditary and idiopathic neuropathy, unspecified: Secondary | ICD-10-CM

## 2023-05-05 DIAGNOSIS — S4992XA Unspecified injury of left shoulder and upper arm, initial encounter: Secondary | ICD-10-CM

## 2023-05-05 MED ORDER — GABAPENTIN 800 MG PO TABS
800.0000 mg | ORAL_TABLET | Freq: Two times a day (BID) | ORAL | 2 refills | Status: DC
  Filled 2023-05-05: qty 60, 30d supply, fill #0
  Filled 2023-06-03: qty 60, 30d supply, fill #1
  Filled 2023-07-03 (×2): qty 60, 30d supply, fill #2

## 2023-05-05 NOTE — Telephone Encounter (Signed)
 Report to Emergency Department/Urgent Care/call 911 for immediate medical evaluation. Follow-up with Primary Care.

## 2023-05-05 NOTE — Telephone Encounter (Signed)
Spoke to patient, informed him of Amy notes. He stated he has to call [facility] for that to be filled.

## 2023-05-05 NOTE — Telephone Encounter (Signed)
Complete

## 2023-05-12 ENCOUNTER — Other Ambulatory Visit (HOSPITAL_COMMUNITY): Payer: Self-pay

## 2023-06-03 ENCOUNTER — Other Ambulatory Visit: Payer: Self-pay

## 2023-06-03 ENCOUNTER — Other Ambulatory Visit: Payer: Self-pay | Admitting: Family

## 2023-06-03 DIAGNOSIS — W19XXXA Unspecified fall, initial encounter: Secondary | ICD-10-CM

## 2023-06-03 DIAGNOSIS — S3991XA Unspecified injury of abdomen, initial encounter: Secondary | ICD-10-CM

## 2023-06-03 DIAGNOSIS — S4992XA Unspecified injury of left shoulder and upper arm, initial encounter: Secondary | ICD-10-CM

## 2023-06-03 NOTE — Telephone Encounter (Signed)
Schedule appointment?

## 2023-06-04 ENCOUNTER — Other Ambulatory Visit (HOSPITAL_COMMUNITY): Payer: Self-pay

## 2023-06-22 ENCOUNTER — Other Ambulatory Visit (HOSPITAL_COMMUNITY): Payer: Self-pay

## 2023-06-25 ENCOUNTER — Encounter: Payer: Self-pay | Admitting: Family

## 2023-06-25 NOTE — Progress Notes (Signed)
 Erroneous encounter-disregard

## 2023-07-03 ENCOUNTER — Other Ambulatory Visit: Payer: Self-pay

## 2023-07-03 ENCOUNTER — Other Ambulatory Visit (HOSPITAL_COMMUNITY): Payer: Self-pay

## 2023-07-03 ENCOUNTER — Other Ambulatory Visit: Payer: Self-pay | Admitting: Family

## 2023-07-03 DIAGNOSIS — I10 Essential (primary) hypertension: Secondary | ICD-10-CM

## 2023-07-03 MED ORDER — AMLODIPINE BESYLATE 10 MG PO TABS
10.0000 mg | ORAL_TABLET | Freq: Every day | ORAL | 0 refills | Status: DC
  Filled 2023-07-03: qty 30, 30d supply, fill #0

## 2023-07-03 NOTE — Telephone Encounter (Signed)
Requested Prescriptions  Pending Prescriptions Disp Refills   amLODipine (NORVASC) 10 MG tablet 90 tablet 0    Sig: Take 1 tablet (10 mg total) by mouth daily.     Cardiovascular: Calcium Channel Blockers 2 Passed - 07/03/2023  3:40 PM      Passed - Last BP in normal range    BP Readings from Last 1 Encounters:  03/25/23 131/86         Passed - Last Heart Rate in normal range    Pulse Readings from Last 1 Encounters:  03/25/23 76         Passed - Valid encounter within last 6 months    Recent Outpatient Visits           3 months ago Primary hypertension   [place] [facility], [place], [place]   9 months ago Primary hypertension   [place] [facility] at [facility], [place], [place]   1 year ago Primary hypertension   [place] [facility], [place], [place]   1 year ago Essential (primary) hypertension   [place] [facility] at [facility], [place], [place]   1 year ago Essential (primary) hypertension   [place] [facility] at [facility], Salomon Fick, NP       Future Appointments             In 4 days Tarry Kos, MD [facility]

## 2023-07-06 ENCOUNTER — Other Ambulatory Visit: Payer: Self-pay | Admitting: Physician Assistant

## 2023-07-06 ENCOUNTER — Other Ambulatory Visit (HOSPITAL_COMMUNITY): Payer: Self-pay

## 2023-07-06 ENCOUNTER — Other Ambulatory Visit: Payer: Self-pay

## 2023-07-06 ENCOUNTER — Telehealth: Payer: Self-pay | Admitting: Orthopaedic Surgery

## 2023-07-06 MED ORDER — MELOXICAM 15 MG PO TABS
15.0000 mg | ORAL_TABLET | Freq: Every day | ORAL | 3 refills | Status: DC
  Filled 2023-07-06: qty 30, 30d supply, fill #0
  Filled 2023-08-03: qty 30, 30d supply, fill #1
  Filled 2023-08-31: qty 30, 30d supply, fill #2
  Filled 2023-09-30: qty 30, 30d supply, fill #3

## 2023-07-06 NOTE — Telephone Encounter (Signed)
Pt called requesting a refill of meloxicam. Pt states he went to pharmacy and that is the only med that wasn't filled. Please call pt when meloxicam has been sent in. Pt phone number is [phone].

## 2023-07-06 NOTE — Telephone Encounter (Signed)
Notified patient.

## 2023-07-06 NOTE — Telephone Encounter (Signed)
I approved a refill request a few minutes ago.

## 2023-07-07 ENCOUNTER — Other Ambulatory Visit (HOSPITAL_COMMUNITY): Payer: Self-pay

## 2023-07-07 ENCOUNTER — Ambulatory Visit: Payer: Medicaid Other | Admitting: Orthopaedic Surgery

## 2023-07-07 ENCOUNTER — Other Ambulatory Visit (INDEPENDENT_AMBULATORY_CARE_PROVIDER_SITE_OTHER): Payer: Medicaid Other

## 2023-07-07 ENCOUNTER — Encounter: Payer: Self-pay | Admitting: Orthopaedic Surgery

## 2023-07-07 DIAGNOSIS — M25561 Pain in right knee: Secondary | ICD-10-CM | POA: Diagnosis not present

## 2023-07-07 DIAGNOSIS — G8929 Other chronic pain: Secondary | ICD-10-CM

## 2023-07-07 DIAGNOSIS — M25562 Pain in left knee: Secondary | ICD-10-CM

## 2023-07-07 MED ORDER — METHYLPREDNISOLONE ACETATE 40 MG/ML IJ SUSP
40.0000 mg | INTRAMUSCULAR | Status: AC | PRN
  Administered 2023-07-07: 40 mg via INTRA_ARTICULAR

## 2023-07-07 MED ORDER — LIDOCAINE HCL 1 % IJ SOLN
2.0000 mL | INTRAMUSCULAR | Status: AC | PRN
  Administered 2023-07-07: 2 mL

## 2023-07-07 MED ORDER — BUPIVACAINE HCL 0.5 % IJ SOLN
2.0000 mL | INTRAMUSCULAR | Status: AC | PRN
  Administered 2023-07-07: 2 mL via INTRA_ARTICULAR

## 2023-07-07 NOTE — Progress Notes (Signed)
Office Visit Note   Patient: Justin Burnett           Date of Birth: 12-18-1965           MRN: [id number] Visit Date: 07/07/2023              Requested by: Rema Fendt, NP [address] [place],  [place] [address] PCP: Rema Fendt, NP   Assessment & Plan: Visit Diagnoses:  1. Chronic pain of both knees     Plan: Naje is a very pleasant 58 year old gentleman with end-stage varus DJD of both knees.  Treatment options were discussed again.  We agreed to do a right knee cortisone injection with plans to do a left total knee arthroplasty in the near future.  He will call [name] to schedule surgery.  Risk benefits prognosis reviewed.  Follow-Up Instructions: No follow-ups on file.   Orders:  Orders Placed This Encounter  Procedures   Large Joint Inj: R knee   XR KNEE 3 VIEW RIGHT   XR KNEE 3 VIEW LEFT   No orders of the defined types were placed in this encounter.     Procedures: Large Joint Inj: R knee on 07/07/2023 9:19 AM Indications: pain Details: 22 G needle  Arthrogram: No  Medications: 40 mg methylPREDNISolone acetate 40 MG/ML; 2 mL lidocaine 1 %; 2 mL bupivacaine 0.5 % Consent was given by the patient. Patient was prepped and draped in the usual sterile fashion.       Clinical Data: No additional findings.   Subjective: Chief Complaint  Patient presents with   Right Knee - Pain   Left Knee - Pain    HPI [name] returns today for follow-up evaluation of bilateral knee arthritis.  He has advanced varus DJD.  Currently out of work.  Lives with his wife.  He has constant pain and has sensation of giving way.  Meloxicam helps temporarily. Review of Systems  Constitutional: Negative.   HENT: Negative.    Eyes: Negative.   Respiratory: Negative.    Cardiovascular: Negative.   Gastrointestinal: Negative.   Endocrine: Negative.   Genitourinary: Negative.   Skin: Negative.   Allergic/Immunologic: Negative.   Neurological: Negative.    Hematological: Negative.   Psychiatric/Behavioral: Negative.    All other systems reviewed and are negative.    Objective: Vital Signs: There were no vitals taken for this visit.  Physical Exam Vitals and nursing note reviewed.  Constitutional:      Appearance: He is well-developed.  Pulmonary:     Effort: Pulmonary effort is normal.  Abdominal:     Palpations: Abdomen is soft.  Skin:    General: Skin is warm.  Neurological:     Mental Status: He is alert and oriented to person, place, and time.  Psychiatric:        Behavior: Behavior normal.        Thought Content: Thought content normal.        Judgment: Judgment normal.     Ortho Exam Examination of bilateral knees are unchanged from prior visit. Specialty Comments:  No specialty comments available.  Imaging: XR KNEE 3 VIEW LEFT Result Date: 07/07/2023 X-rays of the left knee show advanced tricompartmental degenerative joint disease.  Bone-on-bone joint space narrowing with severe varus deformity.  XR KNEE 3 VIEW RIGHT Result Date: 07/07/2023 X-rays demonstrate severe osteoarthritis.  Bone-on-bone joint space narrowing severe varus deformity    PMFS History: Patient Active Problem List   Diagnosis Date  Noted   Osteoarthritis of knees, bilateral 06/13/2022   Paresthesia 03/13/2022   Chronic bilateral low back pain without sciatica 03/13/2022   Hyperlipidemia 10/17/2021   Prediabetes 10/17/2021   Essential (primary) hypertension 09/26/2021   Neuropathy    Osteoarthritis 11/03/2011   Past Medical History:  Diagnosis Date   Hypertension    Nerve damage    Neuromuscular disorder (HCC)    Neuropathy     Family History  Problem Relation Age of Onset   Colon cancer Neg Hx    Esophageal cancer Neg Hx    Rectal cancer Neg Hx    Stomach cancer Neg Hx     Past Surgical History:  Procedure Laterality Date   None     Social History   Occupational History   Not on file  Tobacco Use   Smoking status:  Every Day    Current packs/day: 0.50    Average packs/day: 0.5 packs/day for 13.0 years (6.5 ttl pk-yrs)    Types: Cigarettes    Passive exposure: Current   Smokeless tobacco: Never  Substance and Sexual Activity   Alcohol use: Yes    Alcohol/week: 2.0 standard drinks of alcohol    Types: 2 Standard drinks or equivalent per week    Comment: per week   Drug use: No   Sexual activity: Not Currently

## 2023-07-15 ENCOUNTER — Encounter: Payer: Self-pay | Admitting: Family

## 2023-07-23 ENCOUNTER — Ambulatory Visit: Payer: Medicaid Other | Admitting: Family

## 2023-07-23 ENCOUNTER — Encounter: Payer: Self-pay | Admitting: Family

## 2023-07-23 ENCOUNTER — Other Ambulatory Visit: Payer: Self-pay | Admitting: Family

## 2023-07-23 ENCOUNTER — Other Ambulatory Visit (HOSPITAL_COMMUNITY): Payer: Self-pay

## 2023-07-23 ENCOUNTER — Other Ambulatory Visit: Payer: Self-pay

## 2023-07-23 VITALS — BP 128/80 | HR 84 | Temp 98.5°F | Ht 72.0 in | Wt 231.6 lb

## 2023-07-23 DIAGNOSIS — E785 Hyperlipidemia, unspecified: Secondary | ICD-10-CM

## 2023-07-23 DIAGNOSIS — I10 Essential (primary) hypertension: Secondary | ICD-10-CM

## 2023-07-23 DIAGNOSIS — Z01818 Encounter for other preprocedural examination: Secondary | ICD-10-CM

## 2023-07-23 DIAGNOSIS — R9431 Abnormal electrocardiogram [ECG] [EKG]: Secondary | ICD-10-CM

## 2023-07-23 MED ORDER — AMLODIPINE BESYLATE 10 MG PO TABS
10.0000 mg | ORAL_TABLET | Freq: Every day | ORAL | 0 refills | Status: DC
  Filled 2023-07-23 – 2023-07-31 (×2): qty 90, 90d supply, fill #0

## 2023-07-23 MED ORDER — ATORVASTATIN CALCIUM 20 MG PO TABS
20.0000 mg | ORAL_TABLET | Freq: Every day | ORAL | 0 refills | Status: DC
  Filled 2023-07-23: qty 30, 30d supply, fill #0

## 2023-07-23 MED ORDER — ATORVASTATIN CALCIUM 20 MG PO TABS: 20.0000 mg | ORAL_TABLET | Freq: Every day | ORAL | 0 refills | Status: DC

## 2023-07-23 NOTE — Progress Notes (Signed)
 Patient states no other concerns to discuss.

## 2023-07-23 NOTE — Progress Notes (Signed)
 Patient ID: Justin Burnett, male    DOB: 1965-09-07  MRN: [id number]  CC: Chronic Conditions Follow-Up  Subjective: Justin Burnett is a 58 y.o. male who presents for chronic conditions follow-up.   His concerns today include:  - Doing well on Amlodipine , no issues/concerns. He does not complain of red flag symptoms such as but not limited to chest pain, shortness of breath, worst headache of life, nausea/vomiting.  - Doing well on Atorvastatin , no issues/concerns.  - Reports pending left total knee surgery with Orthopedics and needs preoperative surgical clearance.  Patient Active Problem List   Diagnosis Date Noted   Osteoarthritis of knees, bilateral 06/13/2022   Paresthesia 03/13/2022   Chronic bilateral low back pain without sciatica 03/13/2022   Hyperlipidemia 10/17/2021   Prediabetes 10/17/2021   Essential (primary) hypertension 09/26/2021   Neuropathy    Osteoarthritis 11/03/2011     Current Outpatient Medications on File Prior to Visit  Medication Sig Dispense Refill   amoxicillin -clavulanate (AUGMENTIN ) 875-125 MG tablet Take 1 tablet by mouth 2 (two) times daily. 20 tablet 0   gabapentin  (NEURONTIN ) 800 MG tablet Take 1 tablet (800 mg total) by mouth 2 (two) times daily. 60 tablet 2   meloxicam  (MOBIC ) 15 MG tablet Take 1 tablet (15 mg total) by mouth daily. 30 tablet 3   naproxen (NAPROSYN) 500 MG tablet Take by mouth.     OVER THE COUNTER MEDICATION Vitamin D  3 two capsules daily.     silver  sulfADIAZINE  (SILVADENE ) 1 % cream Apply 1 Application topically 2 (two) times daily. (Patient not taking: Reported on 07/23/2023) 50 g 0   triamcinolone  cream (KENALOG ) 0.1 % Apply 1 Application topically 2 (two) times daily. (Patient not taking: Reported on 07/23/2023) 30 g 0   No current facility-administered medications on file prior to visit.    Allergies  Allergen Reactions   Meperidine Other (See Comments)    Causes seizure-like activities   Meperidine Hcl Other (See  Comments)    Social History   Socioeconomic History   Marital status: Married    Spouse name: [name]    Number of children: 1   Years of education: GED   Highest education level: Not on file  Occupational History   Not on file  Tobacco Use   Smoking status: Every Day    Current packs/day: 0.50    Average packs/day: 0.5 packs/day for 13.0 years (6.5 ttl pk-yrs)    Types: Cigarettes    Passive exposure: Current   Smokeless tobacco: Never  Substance and Sexual Activity   Alcohol use: Yes    Alcohol/week: 2.0 standard drinks of alcohol    Types: 2 Standard drinks or equivalent per week    Comment: per week   Drug use: No   Sexual activity: Not Currently  Other Topics Concern   Not on file  Social History Narrative   Patient lives at home with his wife ([name]).   Patient is unemployed   Engineer, [employer]. School.   Right handed.    Caffeine coffee and mountain dew.   Social Drivers of Health   Financial Resource Strain: Patient Unable To Answer (07/23/2023)   Overall Financial Resource Strain (CARDIA)    Difficulty of Paying Living Expenses: Patient unable to answer  Food Insecurity: Patient Unable To Answer (07/23/2023)   Hunger Vital Sign    Worried About Running Out of Food in the Last Year: Patient unable to answer    Ran Out of Food in  the Last Year: Patient unable to answer  Transportation Needs: Patient Unable To Answer (07/23/2023)   PRAPARE - Transportation    Lack of Transportation (Medical): Patient unable to answer    Lack of Transportation (Non-Medical): Patient unable to answer  Physical Activity: Sufficiently Active (07/23/2023)   Exercise Vital Sign    Days of Exercise per Week: 4 days    Minutes of Exercise per Session: 60 min  Stress: Patient Unable To Answer (07/23/2023)   [employer] of Occupational Health - Occupational Stress Questionnaire    Feeling of Stress : Patient unable to answer  Social Connections: Patient Unable To Answer (07/23/2023)    Social Connection and Isolation Panel [NHANES]    Frequency of Communication with Friends and Family: Patient unable to answer    Frequency of Social Gatherings with Friends and Family: Patient unable to answer    Attends Religious Services: Patient unable to answer    Active Member of Clubs or Organizations: Patient unable to answer    Attends Banker Meetings: Patient unable to answer    Marital Status: Patient unable to answer  Intimate Partner Violence: Not At Risk (07/23/2023)   Humiliation, Afraid, Rape, and Kick questionnaire    Fear of Current or Ex-Partner: No    Emotionally Abused: No    Physically Abused: No    Sexually Abused: No    Family History  Problem Relation Age of Onset   Colon cancer Neg Hx    Esophageal cancer Neg Hx    Rectal cancer Neg Hx    Stomach cancer Neg Hx     Past Surgical History:  Procedure Laterality Date   None      ROS: Review of Systems Negative except as stated above  PHYSICAL EXAM: BP 128/80   Pulse 84   Temp 98.5 F (36.9 C) (Oral)   Ht 6' (1.829 m)   Wt 231 lb 9.6 oz (105.1 kg)   SpO2 97%   BMI 31.41 kg/m   Physical Exam HENT:     Head: Normocephalic and atraumatic.     Nose: Nose normal.     Mouth/Throat:     Mouth: Mucous membranes are moist.     Pharynx: Oropharynx is clear.  Eyes:     Extraocular Movements: Extraocular movements intact.     Conjunctiva/sclera: Conjunctivae normal.     Pupils: Pupils are equal, round, and reactive to light.  Cardiovascular:     Rate and Rhythm: Normal rate and regular rhythm.     Pulses: Normal pulses.     Heart sounds: Normal heart sounds.  Pulmonary:     Effort: Pulmonary effort is normal.     Breath sounds: Normal breath sounds.  Musculoskeletal:        General: Normal range of motion.     Cervical back: Normal range of motion and neck supple.  Neurological:     General: No focal deficit present.     Mental Status: He is alert and oriented to person, place,  and time.  Psychiatric:        Mood and Affect: Mood normal.        Behavior: Behavior normal.     ASSESSMENT AND PLAN: 1. Primary hypertension (Primary) - Continue Amlodipine  as prescribed.  - Counseled on blood pressure goal of less than 130/80, low-sodium, DASH diet, medication compliance, and 150 minutes of moderate intensity exercise per week as tolerated. Counseled on medication adherence and adverse effects. - Follow-up with primary provider in  3 months or sooner if needed.  - amLODipine  (NORVASC ) 10 MG tablet; Take 1 tablet (10 mg total) by mouth daily.  Dispense: 90 tablet; Refill: 0  2. Hyperlipidemia, unspecified hyperlipidemia type - Continue Atorvastatin  as prescribed. Counseled on medication adherence/adverse effects.  - Routine screening.  - Follow-up with primary provider as scheduled.  - atorvastatin  (LIPITOR) 20 MG tablet; Take 1 tablet (20 mg total) by mouth daily.  Dispense: 90 tablet; Refill: 0 - Lipid panel  3. Preoperative clearance - Routine screening.  - CMP14+EGFR - CBC - Hemoglobin A1c - EKG 12-Lead   Patient was given the opportunity to ask questions.  Patient verbalized understanding of the plan and was able to repeat key elements of the plan. Patient was given clear instructions to go to Emergency Department or return to medical center if symptoms don't improve, worsen, or new problems develop.The patient verbalized understanding.   Orders Placed This Encounter  Procedures   CMP14+EGFR   CBC   Hemoglobin A1c   Lipid panel   EKG 12-Lead     Requested Prescriptions   Signed Prescriptions Disp Refills   amLODipine  (NORVASC ) 10 MG tablet 90 tablet 0    Sig: Take 1 tablet (10 mg total) by mouth daily.   atorvastatin  (LIPITOR) 20 MG tablet 90 tablet 0    Sig: Take 1 tablet (20 mg total) by mouth daily.    Return in about 3 months (around 10/20/2023) for Follow-Up or next available chronic conditions.  Greig JINNY Drones, NP

## 2023-07-24 ENCOUNTER — Other Ambulatory Visit: Payer: Self-pay | Admitting: Family

## 2023-07-24 ENCOUNTER — Encounter: Payer: Self-pay | Admitting: Family

## 2023-07-24 ENCOUNTER — Other Ambulatory Visit (HOSPITAL_COMMUNITY): Payer: Self-pay

## 2023-07-24 DIAGNOSIS — E785 Hyperlipidemia, unspecified: Secondary | ICD-10-CM

## 2023-07-24 DIAGNOSIS — R748 Abnormal levels of other serum enzymes: Secondary | ICD-10-CM

## 2023-07-24 LAB — LIPID PANEL
Chol/HDL Ratio: 3.1 {ratio} (ref 0.0–5.0)
Cholesterol, Total: 193 mg/dL (ref 100–199)
HDL: 63 mg/dL (ref >39)
LDL Chol Calc (NIH): 38 mg/dL (ref 0–99)
Triglycerides: 668 mg/dL (ref 0–149)
VLDL Cholesterol Cal: 92 mg/dL — ABNORMAL HIGH (ref 5–40)

## 2023-07-24 LAB — CBC
Hematocrit: 43.3 % (ref 37.5–51.0)
Hemoglobin: 14.8 g/dL (ref 13.0–17.7)
MCH: 34.3 pg — ABNORMAL HIGH (ref 26.6–33.0)
MCHC: 34.2 g/dL (ref 31.5–35.7)
MCV: 101 fL — ABNORMAL HIGH (ref 79–97)
Platelets: 184 10*3/uL (ref 150–450)
RBC: 4.31 x10E6/uL (ref 4.14–5.80)
RDW: 12.6 % (ref 11.6–15.4)
WBC: 5.5 10*3/uL (ref 3.4–10.8)

## 2023-07-24 LAB — CMP14+EGFR
ALT: 44 [IU]/L (ref 0–44)
AST: 70 [IU]/L — ABNORMAL HIGH (ref 0–40)
Albumin: 4.2 g/dL (ref 3.8–4.9)
Alkaline Phosphatase: 96 [IU]/L (ref 44–121)
BUN/Creatinine Ratio: 16 (ref 9–20)
BUN: 14 mg/dL (ref 6–24)
Bilirubin Total: 0.5 mg/dL (ref 0.0–1.2)
CO2: 21 mmol/L (ref 20–29)
Calcium: 9.5 mg/dL (ref 8.7–10.2)
Chloride: 102 mmol/L (ref 96–106)
Creatinine, Ser: 0.88 mg/dL (ref 0.76–1.27)
Globulin, Total: 2.6 g/dL (ref 1.5–4.5)
Glucose: 114 mg/dL — ABNORMAL HIGH (ref 70–99)
Potassium: 4 mmol/L (ref 3.5–5.2)
Sodium: 140 mmol/L (ref 134–144)
Total Protein: 6.8 g/dL (ref 6.0–8.5)
eGFR: 99 mL/min/{1.73_m2} (ref >59)

## 2023-07-24 LAB — HEMOGLOBIN A1C
Est. average glucose Bld gHb Est-mCnc: 137 mg/dL
Hgb A1c MFr Bld: 6.4 % — ABNORMAL HIGH (ref 4.8–5.6)

## 2023-07-24 MED ORDER — ATORVASTATIN CALCIUM 40 MG PO TABS
40.0000 mg | ORAL_TABLET | Freq: Every day | ORAL | 0 refills | Status: DC
  Filled 2023-07-24: qty 90, 90d supply, fill #0

## 2023-07-31 ENCOUNTER — Other Ambulatory Visit (HOSPITAL_COMMUNITY): Payer: Self-pay

## 2023-07-31 ENCOUNTER — Other Ambulatory Visit: Payer: Self-pay

## 2023-07-31 ENCOUNTER — Other Ambulatory Visit: Payer: Self-pay | Admitting: Family

## 2023-07-31 DIAGNOSIS — G609 Hereditary and idiopathic neuropathy, unspecified: Secondary | ICD-10-CM

## 2023-07-31 MED ORDER — GABAPENTIN 800 MG PO TABS
800.0000 mg | ORAL_TABLET | Freq: Two times a day (BID) | ORAL | 0 refills | Status: DC
  Filled 2023-07-31 – 2023-08-03 (×2): qty 60, 30d supply, fill #0

## 2023-08-03 ENCOUNTER — Other Ambulatory Visit (HOSPITAL_COMMUNITY): Payer: Self-pay

## 2023-08-07 ENCOUNTER — Other Ambulatory Visit: Payer: Self-pay

## 2023-08-07 DIAGNOSIS — I10 Essential (primary) hypertension: Secondary | ICD-10-CM | POA: Insufficient documentation

## 2023-08-07 DIAGNOSIS — G709 Myoneural disorder, unspecified: Secondary | ICD-10-CM | POA: Insufficient documentation

## 2023-08-07 DIAGNOSIS — R9431 Abnormal electrocardiogram [ECG] [EKG]: Secondary | ICD-10-CM | POA: Insufficient documentation

## 2023-08-07 DIAGNOSIS — T148XXA Other injury of unspecified body region, initial encounter: Secondary | ICD-10-CM | POA: Insufficient documentation

## 2023-08-11 ENCOUNTER — Ambulatory Visit: Payer: Medicaid Other | Attending: Cardiology | Admitting: Cardiology

## 2023-08-11 ENCOUNTER — Encounter: Payer: Self-pay | Admitting: Cardiology

## 2023-08-11 VITALS — BP 132/88 | HR 92 | Ht 72.0 in | Wt 234.0 lb

## 2023-08-11 DIAGNOSIS — R011 Cardiac murmur, unspecified: Secondary | ICD-10-CM | POA: Insufficient documentation

## 2023-08-11 DIAGNOSIS — Z01810 Encounter for preprocedural cardiovascular examination: Secondary | ICD-10-CM | POA: Diagnosis not present

## 2023-08-11 DIAGNOSIS — E782 Mixed hyperlipidemia: Secondary | ICD-10-CM | POA: Diagnosis not present

## 2023-08-11 DIAGNOSIS — R0989 Other specified symptoms and signs involving the circulatory and respiratory systems: Secondary | ICD-10-CM | POA: Insufficient documentation

## 2023-08-11 DIAGNOSIS — F17210 Nicotine dependence, cigarettes, uncomplicated: Secondary | ICD-10-CM | POA: Insufficient documentation

## 2023-08-11 DIAGNOSIS — I10 Essential (primary) hypertension: Secondary | ICD-10-CM | POA: Diagnosis not present

## 2023-08-11 DIAGNOSIS — E66811 Obesity, class 1: Secondary | ICD-10-CM | POA: Insufficient documentation

## 2023-08-11 DIAGNOSIS — R9431 Abnormal electrocardiogram [ECG] [EKG]: Secondary | ICD-10-CM

## 2023-08-11 MED ORDER — FISH OIL 1000 MG PO CAPS: 2.0000 | ORAL_CAPSULE | Freq: Two times a day (BID) | ORAL | Status: DC

## 2023-08-11 NOTE — Patient Instructions (Addendum)
 Medication Instructions:  Your physician has recommended you make the following change in your medication:   Start fish oil 2,000 mg twice daily.  *If you need a refill on your cardiac medications before your next appointment, please call your pharmacy*   Lab Work: None ordered If you have labs (blood work) drawn today and your tests are completely normal, you will receive your results only by: MyChart Message (if you have MyChart) OR A paper copy in the mail If you have any lab test that is abnormal or we need to change your treatment, we will call you to review the results.   Testing/Procedures:   [facility] at [facility] [address] [place], [place] [address] Phone: [phone]    Please arrive 15 minutes prior to your appointment time for registration and insurance purposes.  The test will take approximately 3 to 4 hours to complete; you may bring reading material.  If someone comes with you to your appointment, they will need to remain in the main lobby due to limited space in the testing area. **If you are pregnant or breastfeeding, please notify the nuclear lab prior to your appointment**  How to prepare for your Myocardial Perfusion Test: Do not eat or drink 3 hours prior to your test, except you may have water. Do not consume products containing caffeine (regular or decaffeinated) 12 hours prior to your test. (ex: coffee, chocolate, sodas, tea). Do bring a list of your current medications with you.  If not listed below, you may take your medications as normal. Do wear comfortable clothes (no dresses or overalls) and walking shoes, tennis shoes preferred (No heels or open toe shoes are allowed). Do NOT wear cologne, perfume, aftershave, or lotions (deodorant is allowed). If these instructions are not followed, your test will have to be rescheduled.  If you cannot keep your appointment, please provide 24 hours notification to  the Nuclear Lab, to avoid a possible $50 charge to your account. Please report to [address] for your test.  If you have questions or concerns about your appointment, you can call the Nuclear Lab at [phone].   If you cannot keep your appointment, please provide 24 hours notification to the Nuclear Lab, to avoid a possible $50 charge to your account.    Your physician has requested that you have an echocardiogram. Echocardiography is a painless test that uses sound waves to create images of your heart. It provides your doctor with information about the size and shape of your heart and how well your heart's chambers and valves are working. This procedure takes approximately one hour. There are no restrictions for this procedure. Please do NOT wear cologne, perfume, aftershave, or lotions (deodorant is allowed). Please arrive 15 minutes prior to your appointment time.  Please note: We ask at that you not bring children with you during ultrasound (echo/ vascular) testing. Due to room size and safety concerns, children are not allowed in the ultrasound rooms during exams. Our front office staff cannot provide observation of children in our lobby area while testing is being conducted. An adult accompanying a patient to their appointment will only be allowed in the ultrasound room at the discretion of the ultrasound technician under special circumstances. We apologize for any inconvenience.  Your physician has requested that you have an abdominal aorta duplex. During this test, an ultrasound is used to evaluate the aorta. Allow 30 minutes for this exam. Do not eat  after midnight the day before and avoid carbonated beverages.  Please note: We ask at that you not bring children with you during ultrasound (echo/ vascular) testing. Due to room size and safety concerns, children are not allowed in the ultrasound rooms during exams. Our front office staff cannot provide observation of  children in our lobby area while testing is being conducted. An adult accompanying a patient to their appointment will only be allowed in the ultrasound room at the discretion of the ultrasound technician under special circumstances. We apologize for any inconvenience.   Follow-Up: At [facility], you and your health needs are our priority.  As part of our continuing mission to provide you with exceptional heart care, we have created designated Provider Care Teams.  These Care Teams include your primary Cardiologist (physician) and Advanced Practice Providers (APPs -  Physician Assistants and Nurse Practitioners) who all work together to provide you with the care you need, when you need it.  We recommend signing up for the patient portal called "MyChart".  Sign up information is provided on this After Visit Summary.  MyChart is used to connect with patients for Virtual Visits (Telemedicine).  Patients are able to view lab/test results, encounter notes, upcoming appointments, etc.  Non-urgent messages can be sent to your provider as well.   To learn more about what you can do with MyChart, go to [email or web address].    Your next appointment:   6 month(s)  Provider:   Belva Crome, MD   Other Instructions  Cardiac Nuclear Scan A cardiac nuclear scan is a test that is done to check the flow of blood to your heart. It is done when you are resting and when you are exercising. The test looks for problems such as: Not enough blood reaching a portion of the heart. The heart muscle not working as it should. You may need this test if you have: Heart disease. Lab results that are not normal. Had heart surgery or a balloon procedure to open up blocked arteries (angioplasty) or a small mesh tube (stent). Chest pain. Shortness of breath. Had a heart attack. In this test, a special dye (tracer) is put into your bloodstream. The tracer will travel to your heart. A camera will then take  pictures of your heart to see how the tracer moves through your heart. This test is usually done at a hospital and takes 2-4 hours. Tell a doctor about: Any allergies you have. All medicines you are taking, including vitamins, herbs, eye drops, creams, and over-the-counter medicines. Any bleeding problems you have. Any surgeries you have had. Any medical conditions you have. Whether you are pregnant or may be pregnant. Any history of asthma or long-term (chronic) lung disease. Any history of heart rhythm disorders or heart valve conditions. What are the risks? Your doctor will talk with you about risks. These may include: Serious chest pain and heart attack. This is only a risk if the stress portion of the test is done. Fast or uneven heartbeats (palpitations). A feeling of warmth in your chest. This feeling usually does not last long. Allergic reaction to the tracer. Shortness of breath or trouble breathing. What happens before the test? Ask your doctor about changing or stopping your normal medicines. Follow instructions from your doctor about what you cannot eat or drink. Remove your jewelry on the day of the test. Ask your doctor if you need to avoid nicotine or caffeine. What happens during the test? An IV tube will be  inserted into one of your veins. Your doctor will give you a small amount of tracer through the IV tube. You will wait for 20-40 minutes while the tracer moves through your bloodstream. Your heart will be monitored with an electrocardiogram (ECG). You will lie down on an exam table. Pictures of your heart will be taken for about 15-20 minutes. You may also have a stress test. For this test, one of these things may be done: You will be asked to exercise on a treadmill or a stationary bike. You will be given medicines that will make your heart work harder. This is done if you are unable to exercise. When blood flow to your heart has peaked, a tracer will again be  given through the IV tube. After 20-40 minutes, you will get back on the exam table. More pictures will be taken of your heart. Depending on the tracer that is used, more pictures may need to be taken 3-4 hours later. Your IV tube will be removed when the test is over. The test may vary among doctors and hospitals. What happens after the test? Ask your doctor: Whether you can return to your normal schedule, including diet, activities, travel, and medicines. Whether you should drink more fluids. This will help to remove the tracer from your body. Ask your doctor, or the department that is doing the test: When will my results be ready? How will I get my results? What are my treatment options? What other tests do I need? What are my next steps? This information is not intended to replace advice given to you by your health care provider. Make sure you discuss any questions you have with your health care provider. Document Revised: 10/29/2021 Document Reviewed: 10/29/2021 Elsevier Patient Education  2023 Else[employer] Inc.  Echocardiogram An echocardiogram is a test that uses sound waves (ultrasound) to produce images of the heart. Images from an echocardiogram can provide important information about: Heart size and shape. The size and thickness and movement of your heart's walls. Heart muscle function and strength. Heart valve function or if you have stenosis. Stenosis is when the heart valves are too narrow. If blood is flowing backward through the heart valves (regurgitation). A tumor or infectious growth around the heart valves. Areas of heart muscle that are not working well because of poor blood flow or injury from a heart attack. Aneurysm detection. An aneurysm is a weak or damaged part of an artery wall. The wall bulges out from the normal force of blood pumping through the body. Tell a health care provider about: Any allergies you have. All medicines you are taking, including  vitamins, herbs, eye drops, creams, and over-the-counter medicines. Any blood disorders you have. Any surgeries you have had. Any medical conditions you have. Whether you are pregnant or may be pregnant. What are the risks? Generally, this is a safe test. However, problems may occur, including an allergic reaction to dye (contrast) that may be used during the test. What happens before the test? No specific preparation is needed. You may eat and drink normally. What happens during the test?  You will take off your clothes from the waist up and put on a hospital gown. Electrodes or electrocardiogram (ECG)patches may be placed on your chest. The electrodes or patches are then connected to a device that monitors your heart rate and rhythm. You will lie down on a table for an ultrasound exam. A gel will be applied to your chest to help sound waves pass  through your skin. A handheld device, called a transducer, will be pressed against your chest and moved over your heart. The transducer produces sound waves that travel to your heart and bounce back (or "echo" back) to the transducer. These sound waves will be captured in real-time and changed into images of your heart that can be viewed on a video monitor. The images will be recorded on a computer and reviewed by your health care provider. You may be asked to change positions or hold your breath for a short time. This makes it easier to get different views or better views of your heart. In some cases, you may receive contrast through an IV in one of your veins. This can improve the quality of the pictures from your heart. The procedure may vary among health care providers and hospitals. What can I expect after the test? You may return to your normal, everyday life, including diet, activities, and medicines, unless your health care provider tells you not to do that. Follow these instructions at home: It is up to you to get the results of your test. Ask  your health care provider, or the department that is doing the test, when your results will be ready. Keep all follow-up visits. This is important. Summary An echocardiogram is a test that uses sound waves (ultrasound) to produce images of the heart. Images from an echocardiogram can provide important information about the size and shape of your heart, heart muscle function, heart valve function, and other possible heart problems. You do not need to do anything to prepare before this test. You may eat and drink normally. After the echocardiogram is completed, you may return to your normal, everyday life, unless your health care provider tells you not to do that. This information is not intended to replace advice given to you by your health care provider. Make sure you discuss any questions you have with your health care provider. Document Revised: 02/13/2021 Document Reviewed: 01/24/2020 Elsevier Patient Education  2023 Elsevier Inc.    Important Information About Sugar

## 2023-08-11 NOTE — Progress Notes (Signed)
 Cardiology Office Note:    Date:  08/11/2023   ID:  Justin Burnett, DOB 06-03-1966, MRN [id number]  PCP:  Rema Fendt, NP  Cardiologist:  Garwin Brothers, MD   Referring MD: Rema Fendt, NP    ASSESSMENT:    1. Abnormal EKG   2. Essential (primary) hypertension   3. Mixed hyperlipidemia   4. Preop cardiovascular exam   5. Obesity (BMI 30.0-34.9)   6. Cardiac murmur   7. Abdominal aortic pulsation   8. Cigarette smoker    PLAN:    In order of problems listed above:  Primary prevention stressed with the patient.  Importance of compliance with diet medication stressed and patient verbalized standing. Preoperative cardiovascular evaluation: Patient has multiple risk factors for coronary artery disease and in view of this we will do a Lexiscan sestamibi.  He is agreeable.  If this test is negative then he is not at high risk for coronary events during the aforementioned surgery.  Meticulous hemodynamic monitoring will further reduce the risk of coronary events. Cardiac murmur: Echocardiogram will be done to assess murmur heard on auscultation. Essential hypertension: Blood pressure is stable and diet was emphasized. Mixed dyslipidemia: On lipid-lowering medications followed by primary care. Cigarette smoke: I spent 5 minutes with the patient discussing solely about smoking. Smoking cessation was counseled. I suggested to the patient also different medications and pharmacological interventions. Patient is keen to try stopping on its own at this time. He will get back to me if he needs any further assistance in this matter. Abdominal pulsations are prominent: In view of smoking we will ReSound to rule out aneurysm Patient will be seen in follow-up appointment in 6 months or earlier if the patient has any concerns.    Medication Adjustments/Labs and Tests Ordered: Current medicines are reviewed at length with the patient today.  Concerns regarding medicines are outlined above.   Orders Placed This Encounter  Procedures   MYOCARDIAL PERFUSION IMAGING   EKG 12-Lead   ECHOCARDIOGRAM COMPLETE   VAS [place] AAA DUPLEX   Meds ordered this encounter  Medications   Omega-3 Fatty Acids (FISH OIL) 1000 MG CAPS    Sig: Take 2 capsules (2,000 mg total) by mouth 2 (two) times daily.     History of Present Illness:    Justin Burnett is a 58 y.o. male who is being seen today for the evaluation of preop cardiovascular evaluation at the request of Rema Fendt, NP.  Patient is a pleasant 58 year old male.  He has past medical history of essential hypertension and mixed dyslipidemia.  He has markedly elevated triglycerides.  He is planning to undergo knee replacement surgery and therefore he is here for evaluation.  He denies any chest pain orthopnea or PND.  He leads a sedentary lifestyle for obvious reasons.  At the time of my evaluation, the patient is alert awake oriented and in no distress.  Unfortunately continues to smoke.  Past Medical History:  Diagnosis Date   Abnormal EKG    Chronic bilateral low back pain without sciatica 03/13/2022   Essential (primary) hypertension 09/26/2021   Hyperlipidemia 10/17/2021   Hypertension    Nerve damage    Neuromuscular disorder (HCC)    Neuropathy    Osteoarthritis 11/03/2011   Osteoarthritis of knees, bilateral 06/13/2022   Paresthesia 03/13/2022   Prediabetes 10/17/2021    Past Surgical History:  Procedure Laterality Date   None      Current Medications: Current Meds  Medication Sig   amLODipine (NORVASC) 10 MG tablet Take 1 tablet (10 mg total) by mouth daily.   amoxicillin-clavulanate (AUGMENTIN) 875-125 MG tablet Take 1 tablet by mouth 2 (two) times daily.   atorvastatin (LIPITOR) 40 MG tablet Take 1 tablet (40 mg total) by mouth daily.   Cholecalciferol (D3 PO) Take 2 capsules by mouth daily.   gabapentin (NEURONTIN) 800 MG tablet Take 1 tablet (800 mg total) by mouth 2 (two) times daily.   meloxicam (MOBIC) 15  MG tablet Take 1 tablet (15 mg total) by mouth daily.   naproxen (NAPROSYN) 500 MG tablet Take 500 mg by mouth as needed for mild pain (pain score 1-3) or moderate pain (pain score 4-6).   Omega-3 Fatty Acids (FISH OIL) 1000 MG CAPS Take 2 capsules (2,000 mg total) by mouth 2 (two) times daily.   silver sulfADIAZINE (SILVADENE) 1 % cream Apply 1 Application topically 2 (two) times daily.   triamcinolone cream (KENALOG) 0.1 % Apply 1 Application topically 2 (two) times daily.     Allergies:   Meperidine and Meperidine hcl   Social History   Socioeconomic History   Marital status: Married    Spouse name: [name]    Number of children: 1   Years of education: GED   Highest education level: Not on file  Occupational History   Not on file  Tobacco Use   Smoking status: Every Day    Current packs/day: 0.50    Average packs/day: 0.5 packs/day for 13.0 years (6.5 ttl pk-yrs)    Types: Cigarettes    Passive exposure: Current   Smokeless tobacco: Never  Substance and Sexual Activity   Alcohol use: Yes    Alcohol/week: 2.0 standard drinks of alcohol    Types: 2 Standard drinks or equivalent per week    Comment: per week   Drug use: No   Sexual activity: Not Currently  Other Topics Concern   Not on file  Social History Narrative   Patient lives at home with his wife ([name]).   Patient is unemployed   Engineer, drilling. School.   Right handed.    Caffeine coffee and mountain dew.   Social Drivers of Health   Financial Resource Strain: Patient Unable To Answer (07/23/2023)   Overall Financial Resource Strain (CARDIA)    Difficulty of Paying Living Expenses: Patient unable to answer  Food Insecurity: Patient Unable To Answer (07/23/2023)   Hunger Vital Sign    Worried About Running Out of Food in the Last Year: Patient unable to answer    Ran Out of Food in the Last Year: Patient unable to answer  Transportation Needs: Patient Unable To Answer (07/23/2023)   PRAPARE - Transportation     Lack of Transportation (Medical): Patient unable to answer    Lack of Transportation (Non-Medical): Patient unable to answer  Physical Activity: Sufficiently Active (07/23/2023)   Exercise Vital Sign    Days of Exercise per Week: 4 days    Minutes of Exercise per Session: 60 min  Stress: Patient Unable To Answer (07/23/2023)   [employer] of Occupational Health - Occupational Stress Questionnaire    Feeling of Stress : Patient unable to answer  Social Connections: Patient Unable To Answer (07/23/2023)   Social Connection and Isolation Panel [NHANES]    Frequency of Communication with Friends and Family: Patient unable to answer    Frequency of Social Gatherings with Friends and Family: Patient unable to answer    Attends Religious Services:  Patient unable to answer    Active Member of Clubs or Organizations: Patient unable to answer    Attends Club or Organization Meetings: Patient unable to answer    Marital Status: Patient unable to answer     Family History: The patient's family history is negative for Colon cancer, Esophageal cancer, Rectal cancer, and Stomach cancer.  ROS:   Please see the history of present illness.    All other systems reviewed and are negative.  EKGs/Labs/Other Studies Reviewed:    The following studies were reviewed today:  EKG Interpretation Date/Time:  Tuesday August 11 2023 13:52:11 EST Ventricular Rate:  92 PR Interval:  130 QRS Duration:  84 QT Interval:  356 QTC Calculation: 440 R Axis:   -43  Text Interpretation: Normal sinus rhythm Left axis deviation Nonspecific ST abnormality Abnormal ECG No previous ECGs available Confirmed by Belva Crome [phone]) on 08/11/2023 2:10:46 PM     Recent Labs: 07/23/2023: ALT 44; BUN 14; Creatinine, Ser 0.88; Hemoglobin 14.8; Platelets 184; Potassium 4.0; Sodium 140  Recent Lipid Panel    Component Value Date/Time   CHOL 193 07/23/2023 1004   TRIG 668 (HH) 07/23/2023 1004   HDL 63 07/23/2023 1004    CHOLHDL 3.1 07/23/2023 1004   LDLCALC 38 07/23/2023 1004    Physical Exam:    VS:  BP 132/88   Pulse 92   Ht 6' (1.829 m)   Wt 234 lb (106.1 kg)   SpO2 95%   BMI 31.74 kg/m     Wt Readings from Last 3 Encounters:  08/11/23 234 lb (106.1 kg)  07/23/23 231 lb 9.6 oz (105.1 kg)  03/25/23 232 lb 9.6 oz (105.5 kg)     GEN: Patient is in no acute distress HEENT: Normal NECK: No JVD; No carotid bruits LYMPHATICS: No lymphadenopathy CARDIAC: S1 S2 regular, 2/6 systolic murmur at the apex. RESPIRATORY:  Clear to auscultation without rales, wheezing or rhonchi  ABDOMEN: Soft, non-tender, non-distended MUSCULOSKELETAL:  No edema; No deformity  SKIN: Warm and dry NEUROLOGIC:  Alert and oriented x 3 PSYCHIATRIC:  Normal affect    Signed, Garwin Brothers, MD  08/11/2023 2:27 PM    [facility]

## 2023-08-12 ENCOUNTER — Encounter (HOSPITAL_COMMUNITY): Payer: Self-pay

## 2023-08-13 ENCOUNTER — Ambulatory Visit (HOSPITAL_COMMUNITY): Payer: Medicaid Other | Attending: Cardiology

## 2023-08-13 DIAGNOSIS — Z01810 Encounter for preprocedural cardiovascular examination: Secondary | ICD-10-CM | POA: Diagnosis not present

## 2023-08-13 DIAGNOSIS — R9431 Abnormal electrocardiogram [ECG] [EKG]: Secondary | ICD-10-CM | POA: Diagnosis not present

## 2023-08-13 LAB — MYOCARDIAL PERFUSION IMAGING
Base ST Depression (mm): 0 mm
LV dias vol: 127 mL (ref 62–150)
LV sys vol: 53 mL
Nuc Stress EF: 58 %
Peak HR: 97 {beats}/min
Rest HR: 75 {beats}/min
Rest Nuclear Isotope Dose: 9.7 mCi
SDS: 2
SRS: 0
SSS: 2
ST Depression (mm): 0 mm
Stress Nuclear Isotope Dose: 32.8 mCi
TID: 1.09

## 2023-08-13 MED ORDER — TECHNETIUM TC 99M TETROFOSMIN IV KIT
32.8000 | PACK | Freq: Once | INTRAVENOUS | Status: AC | PRN
  Administered 2023-08-13: 32.8 via INTRAVENOUS

## 2023-08-13 MED ORDER — TECHNETIUM TC 99M TETROFOSMIN IV KIT
9.7000 | PACK | Freq: Once | INTRAVENOUS | Status: AC | PRN
  Administered 2023-08-13: 9.7 via INTRAVENOUS

## 2023-08-13 MED ORDER — REGADENOSON 0.4 MG/5ML IV SOLN
0.4000 mg | Freq: Once | INTRAVENOUS | Status: AC
  Administered 2023-08-13: 0.4 mg via INTRAVENOUS

## 2023-08-20 ENCOUNTER — Other Ambulatory Visit: Payer: Self-pay | Admitting: Cardiology

## 2023-08-20 DIAGNOSIS — E782 Mixed hyperlipidemia: Secondary | ICD-10-CM

## 2023-08-20 DIAGNOSIS — E66811 Obesity, class 1: Secondary | ICD-10-CM

## 2023-08-20 DIAGNOSIS — Z01810 Encounter for preprocedural cardiovascular examination: Secondary | ICD-10-CM

## 2023-08-20 DIAGNOSIS — R011 Cardiac murmur, unspecified: Secondary | ICD-10-CM

## 2023-08-20 DIAGNOSIS — I10 Essential (primary) hypertension: Secondary | ICD-10-CM

## 2023-08-20 DIAGNOSIS — F17210 Nicotine dependence, cigarettes, uncomplicated: Secondary | ICD-10-CM

## 2023-08-20 DIAGNOSIS — R0989 Other specified symptoms and signs involving the circulatory and respiratory systems: Secondary | ICD-10-CM

## 2023-08-20 DIAGNOSIS — R9431 Abnormal electrocardiogram [ECG] [EKG]: Secondary | ICD-10-CM

## 2023-08-31 ENCOUNTER — Ambulatory Visit (HOSPITAL_BASED_OUTPATIENT_CLINIC_OR_DEPARTMENT_OTHER)
Admission: RE | Admit: 2023-08-31 | Discharge: 2023-08-31 | Disposition: A | Discharge status: Home / Self Care | Payer: Medicaid Other | Source: Ambulatory Visit | Attending: Cardiology | Admitting: Cardiology

## 2023-08-31 ENCOUNTER — Other Ambulatory Visit (HOSPITAL_COMMUNITY): Payer: Self-pay

## 2023-08-31 ENCOUNTER — Other Ambulatory Visit: Payer: Self-pay | Admitting: Family

## 2023-08-31 DIAGNOSIS — I10 Essential (primary) hypertension: Secondary | ICD-10-CM

## 2023-08-31 DIAGNOSIS — E785 Hyperlipidemia, unspecified: Secondary | ICD-10-CM

## 2023-08-31 DIAGNOSIS — R0989 Other specified symptoms and signs involving the circulatory and respiratory systems: Secondary | ICD-10-CM

## 2023-08-31 DIAGNOSIS — Z01810 Encounter for preprocedural cardiovascular examination: Secondary | ICD-10-CM | POA: Insufficient documentation

## 2023-08-31 DIAGNOSIS — R011 Cardiac murmur, unspecified: Secondary | ICD-10-CM | POA: Diagnosis not present

## 2023-08-31 DIAGNOSIS — G609 Hereditary and idiopathic neuropathy, unspecified: Secondary | ICD-10-CM

## 2023-08-31 DIAGNOSIS — R9431 Abnormal electrocardiogram [ECG] [EKG]: Secondary | ICD-10-CM | POA: Insufficient documentation

## 2023-08-31 LAB — ECHOCARDIOGRAM COMPLETE
AR max vel: 3.2 cm2
AV Area VTI: 3.09 cm2
AV Area mean vel: 2.93 cm2
AV Mean grad: 5 mmHg
AV Peak grad: 8.1 mmHg
Ao pk vel: 1.43 m/s
Area-P 1/2: 2.56 cm2
Calc EF: 60.5 %
Single Plane A2C EF: 61.3 %
Single Plane A4C EF: 60 %

## 2023-09-01 ENCOUNTER — Other Ambulatory Visit (HOSPITAL_COMMUNITY): Payer: Self-pay

## 2023-09-01 ENCOUNTER — Other Ambulatory Visit: Payer: Self-pay | Admitting: Family

## 2023-09-01 DIAGNOSIS — G609 Hereditary and idiopathic neuropathy, unspecified: Secondary | ICD-10-CM

## 2023-09-01 MED ORDER — ATORVASTATIN CALCIUM 40 MG PO TABS
40.0000 mg | ORAL_TABLET | Freq: Every day | ORAL | 0 refills | Status: DC
  Filled 2023-09-01 – 2023-10-19 (×2): qty 90, 90d supply, fill #0

## 2023-09-01 MED ORDER — AMLODIPINE BESYLATE 10 MG PO TABS
10.0000 mg | ORAL_TABLET | Freq: Every day | ORAL | 0 refills | Status: DC
Start: 2023-09-01 — End: 2024-01-14
  Filled 2023-09-01 – 2023-10-29 (×2): qty 90, 90d supply, fill #0

## 2023-09-01 MED ORDER — GABAPENTIN 800 MG PO TABS
800.0000 mg | ORAL_TABLET | Freq: Two times a day (BID) | ORAL | 2 refills | Status: DC
  Filled 2023-09-01: qty 60, 30d supply, fill #0
  Filled 2023-09-30: qty 60, 30d supply, fill #1

## 2023-09-01 NOTE — Telephone Encounter (Signed)
-   Amlodipine prescribed 09/01/2023. - Atorvastatin prescribed 09/01/2023. - Gabapentin prescribed 09/01/2023.

## 2023-09-02 ENCOUNTER — Other Ambulatory Visit (HOSPITAL_COMMUNITY): Payer: Self-pay

## 2023-09-03 ENCOUNTER — Other Ambulatory Visit (INDEPENDENT_AMBULATORY_CARE_PROVIDER_SITE_OTHER): Payer: Self-pay

## 2023-09-03 ENCOUNTER — Ambulatory Visit (INDEPENDENT_AMBULATORY_CARE_PROVIDER_SITE_OTHER): Admitting: Orthopaedic Surgery

## 2023-09-03 DIAGNOSIS — M1712 Unilateral primary osteoarthritis, left knee: Secondary | ICD-10-CM

## 2023-09-03 NOTE — Progress Notes (Unsigned)
 Patient is here to get X-rays only.

## 2023-09-17 ENCOUNTER — Encounter: Payer: Self-pay | Admitting: Internal Medicine

## 2023-09-30 ENCOUNTER — Other Ambulatory Visit (HOSPITAL_COMMUNITY): Payer: Self-pay

## 2023-09-30 ENCOUNTER — Other Ambulatory Visit: Payer: Self-pay | Admitting: Family

## 2023-09-30 NOTE — Telephone Encounter (Signed)
Naproxen appears as historical medication. Schedule appointment.

## 2023-10-01 ENCOUNTER — Other Ambulatory Visit (HOSPITAL_COMMUNITY): Payer: Self-pay

## 2023-10-07 NOTE — Telephone Encounter (Signed)
Naproxen appears as historical medication. Schedule appointment.

## 2023-10-14 NOTE — Pre-Procedure Instructions (Signed)
 Surgical Instructions   Your procedure is scheduled on Oct 26, 2023. Report to [facility] Main Entrance "A" at 6:30 A.M., then check in with the Admitting office. Any questions or running late day of surgery: call [phone]  Questions prior to your surgery date: call [phone], Monday-Friday, 8am-4pm. If you experience any cold or flu symptoms such as cough, fever, chills, shortness of breath, etc. between now and your scheduled surgery, please notify us  at the above number.     Remember:  Do not eat after midnight the night before your surgery  You may drink clear liquids until 6:00 AM the morning of your surgery.   Clear liquids allowed are: Water, Non-Citrus Juices (without pulp), Carbonated Beverages, Clear Tea (no milk, honey, etc.), Black Coffee Only (NO MILK, CREAM OR POWDERED CREAMER of any kind), and Gatorade.  Patient Instructions  The night before surgery:  No food after midnight. ONLY clear liquids after midnight  The day of surgery (if you do NOT have diabetes):  Drink ONE (1) Pre-Surgery Clear Ensure by 6:00 AM the morning of surgery. Drink in one sitting. Do not sip.  This drink was given to you during your hospital  pre-op appointment visit.  Nothing else to drink after completing the  Pre-Surgery Clear Ensure.         If you have questions, please contact your surgeon's office.    Take these medicines the morning of surgery with A SIP OF WATER: amLODipine  (NORVASC )  atorvastatin  (LIPITOR)  gabapentin  (NEURONTIN )    One week prior to surgery, STOP taking any Aspirin (unless otherwise instructed by your surgeon) Aleve, Naproxen, Ibuprofen, Motrin, Advil, Goody's, BC's, all herbal medications, fish oil , and non-prescription vitamins. This includes your medication: meloxicam  (MOBIC )                      Do NOT Smoke (Tobacco/Vaping) for 24 hours prior to your procedure.  If you use a CPAP at night, you may bring your mask/headgear for your overnight  stay.   You will be asked to remove any contacts, glasses, piercing's, hearing aid's, dentures/partials prior to surgery. Please bring cases for these items if needed.    Patients discharged the day of surgery will not be allowed to drive home, and someone needs to stay with them for 24 hours.  SURGICAL WAITING ROOM VISITATION Patients may have no more than 2 support people in the waiting area - these visitors may rotate.   Pre-op nurse will coordinate an appropriate time for 1 ADULT support person, who may not rotate, to accompany patient in pre-op.  Children under the age of 60 must have an adult with them who is not the patient and must remain in the main waiting area with an adult.  If the patient needs to stay at the hospital during part of their recovery, the visitor guidelines for inpatient rooms apply.  Please refer to the [facility] website for the visitor guidelines for any additional information.   If you received a COVID test during your pre-op visit  it is requested that you wear a mask when out in public, stay away from anyone that may not be feeling well and notify your surgeon if you develop symptoms. If you have been in contact with anyone that has tested positive in the last 10 days please notify you surgeon.      Pre-operative 5 CHG Bathing Instructions   You can play a key role in reducing the risk of  infection after surgery. Your skin needs to be as free of germs as possible. You can reduce the number of germs on your skin by washing with CHG (chlorhexidine  gluconate) soap before surgery. CHG is an antiseptic soap that kills germs and continues to kill germs even after washing.   DO NOT use if you have an allergy to chlorhexidine /CHG or antibacterial soaps. If your skin becomes reddened or irritated, stop using the CHG and notify one of our RNs at [phone].   Please shower with the CHG soap starting 4 days before surgery using the following schedule:      Please keep in mind the following:  DO NOT shave, including legs and underarms, starting the day of your first shower.   You may shave your face at any point before/day of surgery.  Place clean sheets on your bed the day you start using CHG soap. Use a clean washcloth (not used since being washed) for each shower. DO NOT sleep with pets once you start using the CHG.   CHG Shower Instructions:  Wash your face and private area with normal soap. If you choose to wash your hair, wash first with your normal shampoo.  After you use shampoo/soap, rinse your hair and body thoroughly to remove shampoo/soap residue.  Turn the water OFF and apply about 3 tablespoons (45 ml) of CHG soap to a CLEAN washcloth.  Apply CHG soap ONLY FROM YOUR NECK DOWN TO YOUR TOES (washing for 3-5 minutes)  DO NOT use CHG soap on face, private areas, open wounds, or sores.  Pay special attention to the area where your surgery is being performed.  If you are having back surgery, having someone wash your back for you may be helpful. Wait 2 minutes after CHG soap is applied, then you may rinse off the CHG soap.  Pat dry with a clean towel  Put on clean clothes/pajamas   If you choose to wear lotion, please use ONLY the CHG-compatible lotions that are listed below.  Additional instructions for the day of surgery: DO NOT APPLY any lotions, deodorants, cologne, or perfumes.   Do not bring valuables to the hospital. [facility] is not responsible for any belongings/valuables. Do not wear nail polish, gel polish, artificial nails, or any other type of covering on natural nails (fingers and toes) Do not wear jewelry or makeup Put on clean/comfortable clothes.  Please brush your teeth.  Ask your nurse before applying any prescription medications to the skin.     CHG Compatible Lotions   Aveeno Moisturizing lotion  Cetaphil Moisturizing Cream  Cetaphil Moisturizing Lotion  Clairol Herbal Essence Moisturizing  Lotion, Dry Skin  Clairol Herbal Essence Moisturizing Lotion, Extra Dry Skin  Clairol Herbal Essence Moisturizing Lotion, Normal Skin  Curel Age Defying Therapeutic Moisturizing Lotion with Alpha Hydroxy  Curel Extreme Care Body Lotion  Curel Soothing Hands Moisturizing Hand Lotion  Curel Therapeutic Moisturizing Cream, Fragrance-Free  Curel Therapeutic Moisturizing Lotion, Fragrance-Free  Curel Therapeutic Moisturizing Lotion, Original Formula  Eucerin Daily Replenishing Lotion  Eucerin Dry Skin Therapy Plus Alpha Hydroxy Crme  Eucerin Dry Skin Therapy Plus Alpha Hydroxy Lotion  Eucerin Original Crme  Eucerin Original Lotion  Eucerin Plus Crme Eucerin Plus Lotion  Eucerin TriLipid Replenishing Lotion  Keri Anti-Bacterial Hand Lotion  Keri Deep Conditioning Original Lotion Dry Skin Formula Softly Scented  Keri Deep Conditioning Original Lotion, Fragrance Free Sensitive Skin Formula  Keri Lotion Fast Absorbing Fragrance Free Sensitive Skin Formula  Keri Lotion Fast Absorbing Softly  Scented Dry Skin Formula  Keri Original Lotion  Keri Skin Renewal Lotion Keri Silky Smooth Lotion  Keri Silky Smooth Sensitive Skin Lotion  Nivea Body Creamy Conditioning Oil  Nivea Body Extra Enriched Lotion  Nivea Body Original Lotion  Nivea Body Sheer Moisturizing Lotion Nivea Crme  Nivea Skin Firming Lotion  NutraDerm 30 Skin Lotion  NutraDerm Skin Lotion  NutraDerm Therapeutic Skin Cream  NutraDerm Therapeutic Skin Lotion  ProShield Protective Hand Cream  Provon moisturizing lotion  Please read over the following fact sheets that you were given.

## 2023-10-15 ENCOUNTER — Other Ambulatory Visit (HOSPITAL_COMMUNITY): Payer: Self-pay

## 2023-10-15 ENCOUNTER — Other Ambulatory Visit: Payer: Self-pay | Admitting: Physician Assistant

## 2023-10-15 ENCOUNTER — Ambulatory Visit: Admitting: *Deleted

## 2023-10-15 ENCOUNTER — Encounter (HOSPITAL_COMMUNITY): Payer: Self-pay

## 2023-10-15 ENCOUNTER — Telehealth: Payer: Self-pay | Admitting: Physician Assistant

## 2023-10-15 ENCOUNTER — Other Ambulatory Visit: Payer: Self-pay

## 2023-10-15 ENCOUNTER — Encounter (HOSPITAL_COMMUNITY)
Admission: RE | Admit: 2023-10-15 | Discharge: 2023-10-15 | Disposition: A | Discharge status: Home / Self Care | Source: Ambulatory Visit | Attending: Orthopaedic Surgery | Admitting: Orthopaedic Surgery

## 2023-10-15 VITALS — BP 127/82 | HR 94 | Temp 98.6°F | Resp 19 | Ht 72.0 in | Wt 234.3 lb

## 2023-10-15 VITALS — Ht 72.0 in | Wt 240.0 lb

## 2023-10-15 DIAGNOSIS — Z01812 Encounter for preprocedural laboratory examination: Secondary | ICD-10-CM | POA: Insufficient documentation

## 2023-10-15 DIAGNOSIS — I10 Essential (primary) hypertension: Secondary | ICD-10-CM | POA: Diagnosis not present

## 2023-10-15 DIAGNOSIS — Z01818 Encounter for other preprocedural examination: Secondary | ICD-10-CM

## 2023-10-15 DIAGNOSIS — Z96652 Presence of left artificial knee joint: Secondary | ICD-10-CM | POA: Insufficient documentation

## 2023-10-15 DIAGNOSIS — F17290 Nicotine dependence, other tobacco product, uncomplicated: Secondary | ICD-10-CM | POA: Insufficient documentation

## 2023-10-15 DIAGNOSIS — Z1211 Encounter for screening for malignant neoplasm of colon: Secondary | ICD-10-CM

## 2023-10-15 LAB — CBC
HCT: 40.7 % (ref 39.0–52.0)
Hemoglobin: 14.1 g/dL (ref 13.0–17.0)
MCH: 34.3 pg — ABNORMAL HIGH (ref 26.0–34.0)
MCHC: 34.6 g/dL (ref 30.0–36.0)
MCV: 99 fL (ref 80.0–100.0)
Platelets: 220 10*3/uL (ref 150–400)
RBC: 4.11 MIL/uL — ABNORMAL LOW (ref 4.22–5.81)
RDW: 12.1 % (ref 11.5–15.5)
WBC: 7.3 10*3/uL (ref 4.0–10.5)
nRBC: 0 % (ref 0.0–0.2)

## 2023-10-15 LAB — BASIC METABOLIC PANEL WITH GFR
Anion gap: 13 (ref 5–15)
BUN: 10 mg/dL (ref 6–20)
CO2: 21 mmol/L — ABNORMAL LOW (ref 22–32)
Calcium: 9.4 mg/dL (ref 8.9–10.3)
Chloride: 105 mmol/L (ref 98–111)
Creatinine, Ser: 0.81 mg/dL (ref 0.61–1.24)
GFR, Estimated: >60 mL/min (ref >60)
Glucose, Bld: 96 mg/dL (ref 70–99)
Potassium: 3.5 mmol/L (ref 3.5–5.1)
Sodium: 139 mmol/L (ref 135–145)

## 2023-10-15 LAB — SURGICAL PCR SCREEN
MRSA, PCR: NEGATIVE
Staphylococcus aureus: NEGATIVE

## 2023-10-15 MED ORDER — METHOCARBAMOL 750 MG PO TABS
750.0000 mg | ORAL_TABLET | Freq: Three times a day (TID) | ORAL | 2 refills | Status: DC | PRN
  Filled 2023-10-15: qty 30, 10d supply, fill #0
  Filled 2023-11-03: qty 30, 10d supply, fill #1

## 2023-10-15 MED ORDER — PEG 3350-KCL-NA BICARB-NACL 420 G PO SOLR
4000.0000 mL | Freq: Once | ORAL | 0 refills | Status: AC
  Filled 2023-10-15: qty 4000, 1d supply, fill #0
  Filled 2023-10-15: qty 4000, 2d supply, fill #0

## 2023-10-15 MED ORDER — ONDANSETRON HCL 4 MG PO TABS
4.0000 mg | ORAL_TABLET | Freq: Three times a day (TID) | ORAL | 0 refills | Status: DC | PRN
  Filled 2023-10-15: qty 40, 14d supply, fill #0

## 2023-10-15 MED ORDER — OXYCODONE-ACETAMINOPHEN 5-325 MG PO TABS
1.0000 | ORAL_TABLET | Freq: Four times a day (QID) | ORAL | 0 refills | Status: DC | PRN
Start: 2023-10-15 — End: 2023-11-03
  Filled 2023-10-15: qty 40, 5d supply, fill #0

## 2023-10-15 MED ORDER — DOCUSATE SODIUM 100 MG PO CAPS
100.0000 mg | ORAL_CAPSULE | Freq: Every day | ORAL | 2 refills | Status: DC | PRN
  Filled 2023-10-15: qty 30, 30d supply, fill #0
  Filled 2023-11-23: qty 30, 30d supply, fill #1

## 2023-10-15 MED ORDER — OXYCODONE-ACETAMINOPHEN 5-325 MG PO TABS
1.0000 | ORAL_TABLET | Freq: Four times a day (QID) | ORAL | 0 refills | Status: DC | PRN
  Filled 2023-10-15: qty 40, 5d supply, fill #0

## 2023-10-15 MED ORDER — METHOCARBAMOL 750 MG PO TABS
750.0000 mg | ORAL_TABLET | Freq: Three times a day (TID) | ORAL | 2 refills | Status: DC | PRN
  Filled 2023-10-15: qty 30, 10d supply, fill #0

## 2023-10-15 MED ORDER — DOCUSATE SODIUM 100 MG PO CAPS
100.0000 mg | ORAL_CAPSULE | Freq: Every day | ORAL | 2 refills | Status: DC | PRN
  Filled 2023-10-15: qty 30, 30d supply, fill #0

## 2023-10-15 NOTE — Progress Notes (Signed)
 Pt's name and DOB verified at the beginning of the pre-visit wit 2 identifiers   Pt denies any difficulty with ambulating,sitting, laying down or rolling side to side  Pt has no issues with ambulation   Pt has no issues moving head neck or swallowing  No egg or soy allergy known to patient    Patient denies ever being intubated  No FH of Malignant Hyperthermia  Pt is not on diet pills or shots  Pt is not on home 02   Pt is not on blood thinners   Pt denies issues with constipation   Pt is not on dialysis  Pt denise any abnormal heart rhythms   Pt denies any upcoming cardiac testing  Patient's chart reviewed by Rogena Class CNRA prior to pre-visit and patient appropriate for the [facility].  Pre-visit completed and red dot placed by patient's name on their procedure day (on provider's schedule).     Visit by phone   Pt states weight is 240 LB    IInstructions reviewed. Pt given , [facility] main # and MD on call # prior to instructions.  Pt states understanding of instructions. Instructed to review again prior to procedure. Pt states they will.

## 2023-10-15 NOTE — Telephone Encounter (Signed)
 Called and notified patient that medications have been resent. They are for after surgery.

## 2023-10-15 NOTE — Progress Notes (Signed)
 PCP - Lavona Pounds, NP Cardiologist - Dr. Hillis Lu - Last office visit 08/11/2023 for cardiac clearance  PPM/ICD - Denies Device Orders - n/a Rep Notified - n/a  Chest x-ray - n/a EKG - 08/11/2023 Stress Test - 08/13/2023 ECHO - 08/11/2023 Cardiac Cath - Denies  Sleep Study - Denies CPAP - n/a  No DM  Last dose of GLP1 agonist- n/a GLP1 instructions: n/a  Blood Thinner Instructions: n/a Aspirin Instructions: n/a  ERAS Protcol - Clear liquids until 0600 morning of surgery PRE-SURGERY Ensure or G2- Ensure given to pt with instructions  COVID TEST- n/a   Anesthesia review: Yes. Cardiac clearance   Patient denies shortness of breath, fever, cough and chest pain at PAT appointment. Pt denies any respiratory illness/infection in the last two months.   All instructions explained to the patient, with a verbal understanding of the material. Patient agrees to go over the instructions while at home for a better understanding. Patient also instructed to self quarantine after being tested for COVID-19. The opportunity to ask questions was provided.

## 2023-10-15 NOTE — Telephone Encounter (Signed)
 Meant to send to [facility] so I just resent

## 2023-10-15 NOTE — Telephone Encounter (Signed)
 This pharmacy is just for hospital discharge. They called to say they can not fill rx

## 2023-10-16 ENCOUNTER — Other Ambulatory Visit (HOSPITAL_COMMUNITY): Payer: Self-pay

## 2023-10-16 NOTE — Anesthesia Preprocedure Evaluation (Addendum)
 Anesthesia Evaluation  Patient identified by MRN, date of birth, ID band Patient awake    Reviewed: Allergy & Precautions, NPO status , Patient's Chart, lab work & pertinent test results  History of Anesthesia Complications Negative for: history of anesthetic complications  Airway Mallampati: III  TM Distance: >3 FB Neck ROM: Full    Dental  (+) Dental Advisory Given, Poor Dentition   Pulmonary neg shortness of breath, neg sleep apnea, neg COPD, neg recent URI, Current Smoker (smokes 1 pack every 2 days) and Patient abstained from smoking.   Pulmonary exam normal breath sounds clear to auscultation       Cardiovascular hypertension (amlodipine ), Pt. on medications (-) angina (-) Past MI, (-) Cardiac Stents and (-) CABG (-) dysrhythmias + Valvular Problems/Murmurs  Rhythm:Regular Rate:Normal  HLD  TTE 08/31/2023: IMPRESSIONS    1. Left ventricular ejection fraction, by estimation, is 60 to 65%. The  left ventricle has normal function. The left ventricle has no regional  wall motion abnormalities. Left ventricular diastolic parameters are  consistent with Grade I diastolic  dysfunction (impaired relaxation).   2. Right ventricular systolic function is normal. The right ventricular  size is normal.   3. The mitral valve is normal in structure. No evidence of mitral valve  regurgitation. No evidence of mitral stenosis.   4. The aortic valve is normal in structure. Aortic valve regurgitation is  not visualized. No aortic stenosis is present.   5. The inferior vena cava is normal in size with greater than 50%  respiratory variability, suggesting right atrial pressure of 3 mmHg.   Normal stress test 08/13/2023   Neuro/Psych neg Seizures  Neuromuscular disease (chronic bilateral low back pain, neuropathy)    GI/Hepatic negative GI ROS, Neg liver ROS,,,  Endo/Other  Pre-diabetes  Renal/GU negative Renal ROS      Musculoskeletal  (+) Arthritis , Osteoarthritis,    Abdominal  (+) + obese  Peds  Hematology negative hematology ROS (+) Lab Results      Component                Value               Date                      WBC                      7.3                 10/15/2023                HGB                      14.1                10/15/2023                HCT                      40.7                10/15/2023                MCV                      99.0                10/15/2023  PLT                      220                 10/15/2023              Anesthesia Other Findings   Reproductive/Obstetrics                             Anesthesia Physical Anesthesia Plan  ASA: 2  Anesthesia Plan: MAC and Spinal   Post-op Pain Management: Regional block* and Tylenol  PO (pre-op)*   Induction: Intravenous  PONV Risk Score and Plan: 1 and Ondansetron , Dexamethasone, Propofol infusion, TIVA, Midazolam and Treatment may vary due to age or medical condition  Airway Management Planned: Natural Airway and Simple Face Mask  Additional Equipment:   Intra-op Plan:   Post-operative Plan:   Informed Consent: I have reviewed the patients History and Physical, chart, labs and discussed the procedure including the risks, benefits and alternatives for the proposed anesthesia with the patient or authorized representative who has indicated his/her understanding and acceptance.     Dental advisory given  Plan Discussed with: CRNA and Anesthesiologist  Anesthesia Plan Comments: (Discussed potential risks of nerve blocks including, but not limited to, infection, bleeding, nerve damage, seizures, pneumothorax, respiratory depression, and potential failure of the block. Alternatives to nerve blocks discussed. All questions answered.  I have discussed risks of neuraxial anesthesia including but not limited to infection, bleeding, nerve injury, back pain, headache,  seizures, and failure of block. Patient denies bleeding disorders and is not currently anticoagulated. Labs have been reviewed. Risks and benefits discussed. All patient's questions answered.   Discussed with patient risks of MAC including, but not limited to, minor pain or discomfort, hearing people in the room, and possible need for backup general anesthesia. Risks for general anesthesia also discussed including, but not limited to, sore throat, hoarse voice, chipped/damaged teeth, injury to vocal cords, nausea and vomiting, allergic reactions, lung infection, heart attack, stroke, and death. All questions answered.  PAT note by Rudy Costain, PA-C: 58 yo male follows with cardiology for hx of HTN, HLD, abn EKG. Seen by Dr. Revankar 08/11/23 for proep eval. Echo, stress, and AAA duplex ordered. Echo showed normal biventricular function, grade 1 dd, normal valves. Stress was low risk, nonischemic. AAA duplex negative for aneurysm.   Current every day smoker.  Preop labs reviewed, unremarkable.   EKG 08/11/23: Normal sinus rhythm. Rate 92. Left axis deviation. Nonspecific ST abnormality  TTE 08/31/23: 1. Left ventricular ejection fraction, by estimation, is 60 to 65%. The  left ventricle has normal function. The left ventricle has no regional  wall motion abnormalities. Left ventricular diastolic parameters are  consistent with Grade I diastolic  dysfunction (impaired relaxation).  2. Right ventricular systolic function is normal. The right ventricular  size is normal.  3. The mitral valve is normal in structure. No evidence of mitral valve  regurgitation. No evidence of mitral stenosis.  4. The aortic valve is normal in structure. Aortic valve regurgitation is  not visualized. No aortic stenosis is present.  5. The inferior vena cava is normal in size with greater than 50%  respiratory variability, suggesting right atrial pressure of 3 mmHg.   Nuclear stress 08/13/23:   The study is  normal. The study is low risk.   No ST deviation was noted.   LV perfusion is  normal. There is no evidence of ischemia. There is no evidence of infarction.   Left ventricular function is normal. Nuclear stress EF: 58%. The left ventricular ejection fraction is normal (55-65%). End diastolic cavity size is normal. End systolic cavity size is normal.  )        Anesthesia Quick Evaluation

## 2023-10-16 NOTE — Progress Notes (Signed)
 Anesthesia Chart Review:  58 yo male follows with cardiology for hx of HTN, HLD, abn EKG. Seen by Dr. Revankar 08/11/23 for proep eval. Echo, stress, and AAA duplex ordered. Echo showed normal biventricular function, grade 1 dd, normal valves. Stress was low risk, nonischemic. AAA duplex negative for aneurysm.   Current every day smoker.  Preop labs reviewed, unremarkable.   EKG 08/11/23: Normal sinus rhythm. Rate 92. Left axis deviation. Nonspecific ST abnormality  TTE 08/31/23:  1. Left ventricular ejection fraction, by estimation, is 60 to 65%. The  left ventricle has normal function. The left ventricle has no regional  wall motion abnormalities. Left ventricular diastolic parameters are  consistent with Grade I diastolic  dysfunction (impaired relaxation).   2. Right ventricular systolic function is normal. The right ventricular  size is normal.   3. The mitral valve is normal in structure. No evidence of mitral valve  regurgitation. No evidence of mitral stenosis.   4. The aortic valve is normal in structure. Aortic valve regurgitation is  not visualized. No aortic stenosis is present.   5. The inferior vena cava is normal in size with greater than 50%  respiratory variability, suggesting right atrial pressure of 3 mmHg.   Nuclear stress 08/13/23:   The study is normal. The study is low risk.   No ST deviation was noted.   LV perfusion is normal. There is no evidence of ischemia. There is no evidence of infarction.   Left ventricular function is normal. Nuclear stress EF: 58%. The left ventricular ejection fraction is normal (55-65%). End diastolic cavity size is normal. End systolic cavity size is normal.    Edilia Gordon [facility] Short Stay Center/Anesthesiology Phone [phone] 10/16/2023 9:18 AM

## 2023-10-19 ENCOUNTER — Other Ambulatory Visit: Payer: Self-pay | Admitting: Physician Assistant

## 2023-10-19 ENCOUNTER — Other Ambulatory Visit (HOSPITAL_COMMUNITY): Payer: Self-pay

## 2023-10-19 ENCOUNTER — Other Ambulatory Visit: Payer: Self-pay

## 2023-10-19 MED ORDER — MELOXICAM 15 MG PO TABS
15.0000 mg | ORAL_TABLET | Freq: Every day | ORAL | 3 refills | Status: DC
  Filled 2023-10-19: qty 30, 30d supply, fill #0

## 2023-10-20 ENCOUNTER — Other Ambulatory Visit (HOSPITAL_COMMUNITY): Payer: Self-pay

## 2023-10-20 ENCOUNTER — Ambulatory Visit (INDEPENDENT_AMBULATORY_CARE_PROVIDER_SITE_OTHER): Payer: Medicaid Other | Admitting: Family

## 2023-10-20 ENCOUNTER — Encounter: Payer: Self-pay | Admitting: Family

## 2023-10-20 VITALS — BP 132/81 | HR 81 | Temp 98.9°F | Resp 18 | Ht 72.0 in | Wt 235.0 lb

## 2023-10-20 DIAGNOSIS — G609 Hereditary and idiopathic neuropathy, unspecified: Secondary | ICD-10-CM

## 2023-10-20 MED ORDER — GABAPENTIN 800 MG PO TABS
800.0000 mg | ORAL_TABLET | Freq: Two times a day (BID) | ORAL | 2 refills | Status: DC
  Filled 2023-10-20 – 2023-10-29 (×2): qty 60, 30d supply, fill #0
  Filled 2023-11-25: qty 60, 30d supply, fill #1
  Filled 2023-12-24: qty 60, 30d supply, fill #2

## 2023-10-20 NOTE — Progress Notes (Signed)
3 month follow up no concerns

## 2023-10-20 NOTE — Progress Notes (Addendum)
 Patient ID: Justin Burnett, male    DOB: 04/08/1966  MRN: [id number]  CC: Chronic Conditions Follow-Up  Subjective: Justin Burnett is a 58 y.o. male who presents for chronic conditions follow-up.   His concerns today include:  - Doing well on Gabapentin , no issues/concerns. Reports neuropathy of bilateral hands and bilateral lower extremities.  - States anxiety related to health conditions. He declines pharmacological therapy and referral to therapist. He denies thoughts of self-harm, suicidal ideations, homicidal ideations. - Established with Cardiology.  - Established with Orthopedics.   Patient Active Problem List   Diagnosis Date Noted   Preop cardiovascular exam 08/11/2023   Obesity (BMI 30.0-34.9) 08/11/2023   Cardiac murmur 08/11/2023   Cigarette smoker 08/11/2023   Prominent abdominal aortic pulsation 08/11/2023   Hypertension    Nerve damage    Neuromuscular disorder (HCC)    Abnormal EKG    Osteoarthritis of knees, bilateral 06/13/2022   Paresthesia 03/13/2022   Chronic bilateral low back pain without sciatica 03/13/2022   Hyperlipidemia 10/17/2021   Prediabetes 10/17/2021   Essential (primary) hypertension 09/26/2021   Neuropathy    Osteoarthritis 11/03/2011     Current Outpatient Medications on File Prior to Visit  Medication Sig Dispense Refill   amLODipine  (NORVASC ) 10 MG tablet Take 1 tablet (10 mg total) by mouth daily. 90 tablet 0   Ascorbic Acid (VITAMIN C) 1000 MG tablet Take 1,000 mg by mouth daily.     atorvastatin  (LIPITOR) 40 MG tablet Take 1 tablet (40 mg total) by mouth daily. 90 tablet 0   Cholecalciferol 25 MCG (1000 UT) capsule Take 1,000 Units by mouth daily.     meloxicam  (MOBIC ) 15 MG tablet Take 1 tablet (15 mg total) by mouth daily. 30 tablet 3   polyethylene glycol-electrolytes (NULYTELY) 420 g solution Take as directed 4000 mL 0   silver  sulfADIAZINE  (SILVADENE ) 1 % cream Apply 1 Application topically 2 (two) times daily. 50 g 0    amoxicillin -clavulanate (AUGMENTIN ) 875-125 MG tablet Take 1 tablet by mouth 2 (two) times daily. (Patient not taking: Reported on 10/15/2023) 20 tablet 0   docusate sodium  (COLACE) 100 MG capsule Take 1 capsule (100 mg total) by mouth daily as needed. (Patient not taking: Reported on 10/15/2023) 30 capsule 2   methocarbamol  (ROBAXIN -750) 750 MG tablet Take 1 tablet (750 mg total) by mouth 3 (three) times daily as needed for muscle spasms. (Patient not taking: Reported on 10/15/2023) 30 tablet 2   ondansetron  (ZOFRAN ) 4 MG tablet Take 1 tablet (4 mg total) by mouth every 8 (eight) hours as needed for nausea or vomiting. (Patient not taking: Reported on 10/15/2023) 40 tablet 0   oxyCODONE -acetaminophen  (PERCOCET) 5-325 MG tablet Take 1-2 tablets by mouth every 6 (six) hours as needed. (to be taken after surgery) (Patient not taking: Reported on 10/15/2023) 40 tablet 0   No current facility-administered medications on file prior to visit.    Allergies  Allergen Reactions   Meperidine Other (See Comments)    Causes seizure-like activities Demerol   Meperidine Hcl Other (See Comments)    Social History   Socioeconomic History   Marital status: Married    Spouse name: [name]    Number of children: 1   Years of education: GED   Highest education level: Not on file  Occupational History   Not on file  Tobacco Use   Smoking status: Every Day    Current packs/day: 0.50    Average packs/day: 0.5 packs/day for 13.0  years (6.5 ttl pk-yrs)    Types: Cigarettes    Passive exposure: Current   Smokeless tobacco: Never  Vaping Use   Vaping status: Never Used  Substance and Sexual Activity   Alcohol use: Not Currently    Alcohol/week: 3.0 standard drinks of alcohol    Types: 3 Cans of beer per week   Drug use: No   Sexual activity: Not Currently  Other Topics Concern   Not on file  Social History Narrative   Patient lives at home with his wife ([name]).   Patient is unemployed   Scientist, research (life sciences). School.   Right handed.    Caffeine coffee and mountain dew.   Social Drivers of Health   Financial Resource Strain: Patient Unable To Answer (07/23/2023)   Overall Financial Resource Strain (CARDIA)    Difficulty of Paying Living Expenses: Patient unable to answer  Food Insecurity: Patient Unable To Answer (07/23/2023)   Hunger Vital Sign    Worried About Running Out of Food in the Last Year: Patient unable to answer    Ran Out of Food in the Last Year: Patient unable to answer  Transportation Needs: Patient Unable To Answer (07/23/2023)   PRAPARE - Transportation    Lack of Transportation (Medical): Patient unable to answer    Lack of Transportation (Non-Medical): Patient unable to answer  Physical Activity: Sufficiently Active (07/23/2023)   Exercise Vital Sign    Days of Exercise per Week: 4 days    Minutes of Exercise per Session: 60 min  Stress: Patient Unable To Answer (07/23/2023)   [employer] of Occupational Health - Occupational Stress Questionnaire    Feeling of Stress : Patient unable to answer  Social Connections: Patient Unable To Answer (07/23/2023)   Social Connection and Isolation Panel [NHANES]    Frequency of Communication with Friends and Family: Patient unable to answer    Frequency of Social Gatherings with Friends and Family: Patient unable to answer    Attends Religious Services: Patient unable to answer    Active Member of Clubs or Organizations: Patient unable to answer    Attends Banker Meetings: Patient unable to answer    Marital Status: Patient unable to answer  Intimate Partner Violence: Not At Risk (07/23/2023)   Humiliation, Afraid, Rape, and Kick questionnaire    Fear of Current or Ex-Partner: No    Emotionally Abused: No    Physically Abused: No    Sexually Abused: No    Family History  Problem Relation Age of Onset   Colon cancer Neg Hx    Esophageal cancer Neg Hx    Rectal cancer Neg Hx    Stomach cancer Neg Hx    Colon  polyps Neg Hx     Past Surgical History:  Procedure Laterality Date   FINGER SURGERY Left    As a child. Tip of middle finger cut off in wood shop    ROS: Review of Systems Negative except as stated above  PHYSICAL EXAM: BP 132/81   Pulse 81   Temp 98.9 F (37.2 C) (Oral)   Resp 18   Ht 6' (1.829 m)   Wt 235 lb (106.6 kg)   SpO2 98%   BMI 31.87 kg/m   Physical Exam HENT:     Head: Normocephalic and atraumatic.     Nose: Nose normal.     Mouth/Throat:     Mouth: Mucous membranes are moist.     Pharynx: Oropharynx is clear.  Eyes:     Extraocular Movements: Extraocular movements intact.     Conjunctiva/sclera: Conjunctivae normal.     Pupils: Pupils are equal, round, and reactive to light.  Cardiovascular:     Rate and Rhythm: Normal rate and regular rhythm.     Pulses: Normal pulses.     Heart sounds: Normal heart sounds.  Pulmonary:     Effort: Pulmonary effort is normal.     Breath sounds: Normal breath sounds.  Musculoskeletal:        General: Normal range of motion.     Right shoulder: Normal.     Left shoulder: Normal.     Right upper arm: Normal.     Left upper arm: Normal.     Right elbow: Normal.     Left elbow: Normal.     Right forearm: Normal.     Left forearm: Normal.     Right wrist: Normal.     Left wrist: Normal.     Right hand: Normal.     Left hand: Normal.     Cervical back: Normal, normal range of motion and neck supple.     Thoracic back: Normal.     Lumbar back: Normal.     Right hip: Normal.     Left hip: Normal.     Right upper leg: Normal.     Left upper leg: Normal.     Right knee: Normal.     Left knee: Normal.     Right lower leg: Normal.     Left lower leg: Normal.     Right ankle: Normal.     Left ankle: Normal.     Right foot: Normal.     Left foot: Normal.  Neurological:     General: No focal deficit present.     Mental Status: He is alert and oriented to person, place, and time.  Psychiatric:        Mood and  Affect: Mood normal.        Behavior: Behavior normal.      ASSESSMENT AND PLAN: 1. Hereditary and idiopathic peripheral neuropathy (Primary) - Continue Gabapentin  as prescribed. Counseled on medication adherence/adverse effects.  - Follow-up with primary provider in 3 months or sooner if needed.  - gabapentin  (NEURONTIN ) 800 MG tablet; Take 1 tablet (800 mg total) by mouth 2 (two) times daily.  Dispense: 60 tablet; Refill: 2   Patient was given the opportunity to ask questions.  Patient verbalized understanding of the plan and was able to repeat key elements of the plan. Patient was given clear instructions to go to Emergency Department or return to medical center if symptoms don't improve, worsen, or new problems develop.The patient verbalized understanding.   Requested Prescriptions   Signed Prescriptions Disp Refills   gabapentin  (NEURONTIN ) 800 MG tablet 60 tablet 2    Sig: Take 1 tablet (800 mg total) by mouth 2 (two) times daily.    Follow-up with primary provider as scheduled.  [name], NP

## 2023-10-23 DIAGNOSIS — H5213 Myopia, bilateral: Secondary | ICD-10-CM | POA: Diagnosis not present

## 2023-10-23 MED ORDER — TRANEXAMIC ACID 1000 MG/10ML IV SOLN
2000.0000 mg | INTRAVENOUS | Status: AC
  Filled 2023-10-23: qty 20

## 2023-10-23 NOTE — Progress Notes (Signed)
 Patient made aware of surgery arrival time change. Patient to arrive at 0830 on 10/26/2023. Patient verbalized understanding. all questions answered.

## 2023-10-26 ENCOUNTER — Ambulatory Visit (HOSPITAL_COMMUNITY): Payer: Self-pay | Admitting: Physician Assistant

## 2023-10-26 ENCOUNTER — Ambulatory Visit (HOSPITAL_COMMUNITY): Payer: Self-pay | Admitting: Anesthesiology

## 2023-10-26 ENCOUNTER — Other Ambulatory Visit: Payer: Self-pay | Admitting: Physician Assistant

## 2023-10-26 ENCOUNTER — Observation Stay (HOSPITAL_COMMUNITY)
Admission: RE | Admit: 2023-10-26 | Discharge: 2023-10-27 | Disposition: A | Discharge status: Home / Self Care | Attending: Orthopaedic Surgery | Admitting: Orthopaedic Surgery

## 2023-10-26 ENCOUNTER — Other Ambulatory Visit: Payer: Self-pay

## 2023-10-26 ENCOUNTER — Observation Stay (HOSPITAL_COMMUNITY)

## 2023-10-26 ENCOUNTER — Other Ambulatory Visit (HOSPITAL_COMMUNITY): Payer: Self-pay

## 2023-10-26 ENCOUNTER — Encounter (HOSPITAL_COMMUNITY)
Admission: RE | Disposition: A | Discharge status: Home / Self Care | Payer: Self-pay | Source: Home / Self Care | Attending: Orthopaedic Surgery

## 2023-10-26 ENCOUNTER — Encounter (HOSPITAL_COMMUNITY): Payer: Self-pay | Admitting: Orthopaedic Surgery

## 2023-10-26 DIAGNOSIS — Z7901 Long term (current) use of anticoagulants: Secondary | ICD-10-CM | POA: Diagnosis not present

## 2023-10-26 DIAGNOSIS — I10 Essential (primary) hypertension: Secondary | ICD-10-CM

## 2023-10-26 DIAGNOSIS — F17210 Nicotine dependence, cigarettes, uncomplicated: Secondary | ICD-10-CM | POA: Insufficient documentation

## 2023-10-26 DIAGNOSIS — G8918 Other acute postprocedural pain: Secondary | ICD-10-CM | POA: Diagnosis not present

## 2023-10-26 DIAGNOSIS — E785 Hyperlipidemia, unspecified: Secondary | ICD-10-CM

## 2023-10-26 DIAGNOSIS — Z471 Aftercare following joint replacement surgery: Secondary | ICD-10-CM | POA: Diagnosis not present

## 2023-10-26 DIAGNOSIS — Z96652 Presence of left artificial knee joint: Secondary | ICD-10-CM | POA: Diagnosis not present

## 2023-10-26 DIAGNOSIS — M1712 Unilateral primary osteoarthritis, left knee: Secondary | ICD-10-CM

## 2023-10-26 DIAGNOSIS — Z79899 Other long term (current) drug therapy: Secondary | ICD-10-CM | POA: Insufficient documentation

## 2023-10-26 HISTORY — PX: TOTAL KNEE ARTHROPLASTY: SHX125

## 2023-10-26 SURGERY — ARTHROPLASTY, KNEE, TOTAL
Anesthesia: Monitor Anesthesia Care | Site: Knee | Laterality: Left

## 2023-10-26 MED ORDER — ONDANSETRON HCL 4 MG PO TABS: 4.0000 mg | ORAL_TABLET | Freq: Four times a day (QID) | ORAL | Status: DC | PRN

## 2023-10-26 MED ORDER — ORAL CARE MOUTH RINSE: 15.0000 mL | Freq: Once | OROMUCOSAL | Status: AC

## 2023-10-26 MED ORDER — ACETAMINOPHEN 500 MG PO TABS
1000.0000 mg | ORAL_TABLET | Freq: Four times a day (QID) | ORAL | Status: AC
  Administered 2023-10-26 – 2023-10-27 (×4): 1000 mg via ORAL
  Filled 2023-10-26 (×4): qty 2

## 2023-10-26 MED ORDER — PHENYLEPHRINE 80 MCG/ML (10ML) SYRINGE FOR IV PUSH (FOR BLOOD PRESSURE SUPPORT)
PREFILLED_SYRINGE | INTRAVENOUS | Status: DC | PRN
  Administered 2023-10-26 (×3): 80 ug via INTRAVENOUS

## 2023-10-26 MED ORDER — PRONTOSAN WOUND IRRIGATION OPTIME
TOPICAL | Status: DC | PRN
  Administered 2023-10-26: 1 via TOPICAL

## 2023-10-26 MED ORDER — TRANEXAMIC ACID-NACL 1000-0.7 MG/100ML-% IV SOLN
1000.0000 mg | Freq: Once | INTRAVENOUS | Status: AC
  Administered 2023-10-26: 1000 mg via INTRAVENOUS
  Filled 2023-10-26: qty 100

## 2023-10-26 MED ORDER — DEXAMETHASONE SODIUM PHOSPHATE 10 MG/ML IJ SOLN
INTRAMUSCULAR | Status: DC | PRN
  Administered 2023-10-26: 5 mg via INTRAVENOUS

## 2023-10-26 MED ORDER — PROPOFOL 10 MG/ML IV BOLUS
INTRAVENOUS | Status: DC | PRN
  Administered 2023-10-26: 30 mg via INTRAVENOUS
  Administered 2023-10-26: 100 ug/kg/min via INTRAVENOUS

## 2023-10-26 MED ORDER — AMISULPRIDE (ANTIEMETIC) 5 MG/2ML IV SOLN: 10.0000 mg | Freq: Once | INTRAVENOUS | Status: DC | PRN

## 2023-10-26 MED ORDER — DOCUSATE SODIUM 100 MG PO CAPS
100.0000 mg | ORAL_CAPSULE | Freq: Two times a day (BID) | ORAL | Status: DC
  Administered 2023-10-26 – 2023-10-27 (×3): 100 mg via ORAL
  Filled 2023-10-26 (×3): qty 1

## 2023-10-26 MED ORDER — PHENOL 1.4 % MT LIQD: 1.0000 | OROMUCOSAL | Status: DC | PRN

## 2023-10-26 MED ORDER — HYDROMORPHONE HCL 1 MG/ML IJ SOLN
INTRAMUSCULAR | Status: AC
  Filled 2023-10-26: qty 1

## 2023-10-26 MED ORDER — METHOCARBAMOL 500 MG PO TABS
500.0000 mg | ORAL_TABLET | Freq: Four times a day (QID) | ORAL | Status: DC | PRN
  Administered 2023-10-26 (×2): 500 mg via ORAL
  Filled 2023-10-26: qty 1

## 2023-10-26 MED ORDER — CHLORHEXIDINE GLUCONATE 0.12 % MT SOLN: 15.0000 mL | Freq: Once | OROMUCOSAL | Status: AC

## 2023-10-26 MED ORDER — LORAZEPAM 2 MG/ML IJ SOLN: 1.0000 mg | INTRAMUSCULAR | Status: DC | PRN

## 2023-10-26 MED ORDER — FENTANYL CITRATE (PF) 100 MCG/2ML IJ SOLN
INTRAMUSCULAR | Status: AC
  Filled 2023-10-26: qty 2

## 2023-10-26 MED ORDER — OXYCODONE HCL ER 10 MG PO T12A
10.0000 mg | EXTENDED_RELEASE_TABLET | Freq: Two times a day (BID) | ORAL | Status: DC
  Administered 2023-10-26 – 2023-10-27 (×3): 10 mg via ORAL
  Filled 2023-10-26 (×3): qty 1

## 2023-10-26 MED ORDER — MIDAZOLAM HCL 2 MG/2ML IJ SOLN: 2.0000 mg | Freq: Once | INTRAMUSCULAR | Status: AC

## 2023-10-26 MED ORDER — CEFAZOLIN SODIUM-DEXTROSE 2-4 GM/100ML-% IV SOLN
2.0000 g | Freq: Four times a day (QID) | INTRAVENOUS | Status: AC
  Administered 2023-10-26 (×2): 2 g via INTRAVENOUS
  Filled 2023-10-26 (×2): qty 100

## 2023-10-26 MED ORDER — METHOCARBAMOL 500 MG PO TABS
ORAL_TABLET | ORAL | Status: AC
Start: 2023-10-26 — End: 2023-10-27
  Filled 2023-10-26: qty 1

## 2023-10-26 MED ORDER — GLYCOPYRROLATE 0.2 MG/ML IJ SOLN
INTRAMUSCULAR | Status: DC | PRN
  Administered 2023-10-26 (×2): .1 mg via INTRAVENOUS

## 2023-10-26 MED ORDER — ROPIVACAINE HCL 5 MG/ML IJ SOLN
INTRAMUSCULAR | Status: DC | PRN
  Administered 2023-10-26: 20 mL via PERINEURAL

## 2023-10-26 MED ORDER — HYDROMORPHONE HCL 1 MG/ML IJ SOLN
0.5000 mg | INTRAMUSCULAR | Status: DC | PRN
  Administered 2023-10-26: 0.5 mg via INTRAVENOUS

## 2023-10-26 MED ORDER — ONDANSETRON HCL 4 MG/2ML IJ SOLN
INTRAMUSCULAR | Status: DC | PRN
  Administered 2023-10-26: 4 mg via INTRAVENOUS

## 2023-10-26 MED ORDER — PROPOFOL 500 MG/50ML IV EMUL
INTRAVENOUS | Status: DC | PRN
  Administered 2023-10-26: 20 mg via INTRAVENOUS
  Administered 2023-10-26: 30 mg via INTRAVENOUS

## 2023-10-26 MED ORDER — LACTATED RINGERS IV SOLN: INTRAVENOUS | Status: DC

## 2023-10-26 MED ORDER — THIAMINE HCL 100 MG/ML IJ SOLN
100.0000 mg | Freq: Every day | INTRAMUSCULAR | Status: DC
  Filled 2023-10-26 (×2): qty 1

## 2023-10-26 MED ORDER — GLYCOPYRROLATE PF 0.2 MG/ML IJ SOSY
PREFILLED_SYRINGE | INTRAMUSCULAR | Status: AC
  Filled 2023-10-26: qty 1

## 2023-10-26 MED ORDER — BUPIVACAINE-MELOXICAM ER 400-12 MG/14ML IJ SOLN
INTRAMUSCULAR | Status: AC
  Filled 2023-10-26: qty 1

## 2023-10-26 MED ORDER — MIDAZOLAM HCL 2 MG/2ML IJ SOLN
INTRAMUSCULAR | Status: AC
  Filled 2023-10-26: qty 2

## 2023-10-26 MED ORDER — DEXAMETHASONE SODIUM PHOSPHATE 10 MG/ML IJ SOLN
10.0000 mg | Freq: Once | INTRAMUSCULAR | Status: AC
  Administered 2023-10-27: 10 mg via INTRAVENOUS
  Filled 2023-10-26: qty 1

## 2023-10-26 MED ORDER — ONDANSETRON HCL 4 MG/2ML IJ SOLN
4.0000 mg | Freq: Four times a day (QID) | INTRAMUSCULAR | Status: DC | PRN
  Administered 2023-10-26: 4 mg via INTRAVENOUS
  Filled 2023-10-26: qty 2

## 2023-10-26 MED ORDER — METOCLOPRAMIDE HCL 5 MG PO TABS: 5.0000 mg | ORAL_TABLET | Freq: Three times a day (TID) | ORAL | Status: DC | PRN

## 2023-10-26 MED ORDER — ACETAMINOPHEN 500 MG PO TABS: 1000.0000 mg | ORAL_TABLET | Freq: Once | ORAL | Status: AC

## 2023-10-26 MED ORDER — METHOCARBAMOL 1000 MG/10ML IJ SOLN: 500.0000 mg | Freq: Four times a day (QID) | INTRAMUSCULAR | Status: DC | PRN

## 2023-10-26 MED ORDER — OXYCODONE HCL 5 MG PO TABS
10.0000 mg | ORAL_TABLET | ORAL | Status: DC | PRN
  Administered 2023-10-26: 10 mg via ORAL

## 2023-10-26 MED ORDER — DEXAMETHASONE SODIUM PHOSPHATE 10 MG/ML IJ SOLN
INTRAMUSCULAR | Status: AC
  Filled 2023-10-26: qty 1

## 2023-10-26 MED ORDER — METOCLOPRAMIDE HCL 5 MG/ML IJ SOLN: 5.0000 mg | Freq: Three times a day (TID) | INTRAMUSCULAR | Status: DC | PRN

## 2023-10-26 MED ORDER — ADULT MULTIVITAMIN W/MINERALS CH
1.0000 | ORAL_TABLET | Freq: Every day | ORAL | Status: DC
  Administered 2023-10-26 – 2023-10-27 (×2): 1 via ORAL
  Filled 2023-10-26 (×2): qty 1

## 2023-10-26 MED ORDER — FENTANYL CITRATE (PF) 100 MCG/2ML IJ SOLN
25.0000 ug | INTRAMUSCULAR | Status: DC | PRN
  Administered 2023-10-26 (×3): 50 ug via INTRAVENOUS

## 2023-10-26 MED ORDER — PROPOFOL 10 MG/ML IV BOLUS
INTRAVENOUS | Status: AC
  Filled 2023-10-26: qty 20

## 2023-10-26 MED ORDER — FENTANYL CITRATE (PF) 250 MCG/5ML IJ SOLN
INTRAMUSCULAR | Status: DC | PRN
  Administered 2023-10-26 (×3): 50 ug via INTRAVENOUS

## 2023-10-26 MED ORDER — MIDAZOLAM HCL 2 MG/2ML IJ SOLN
INTRAMUSCULAR | Status: DC | PRN
  Administered 2023-10-26 (×2): 1 mg via INTRAVENOUS

## 2023-10-26 MED ORDER — AMLODIPINE BESYLATE 10 MG PO TABS
10.0000 mg | ORAL_TABLET | Freq: Every day | ORAL | Status: DC
  Administered 2023-10-27: 10 mg via ORAL
  Filled 2023-10-26: qty 1

## 2023-10-26 MED ORDER — TRANEXAMIC ACID 1000 MG/10ML IV SOLN
INTRAVENOUS | Status: DC | PRN
  Administered 2023-10-26: 2000 mg via TOPICAL

## 2023-10-26 MED ORDER — OXYCODONE HCL 5 MG PO TABS
5.0000 mg | ORAL_TABLET | Freq: Once | ORAL | Status: AC | PRN
  Administered 2023-10-26: 5 mg via ORAL

## 2023-10-26 MED ORDER — DEXMEDETOMIDINE HCL IN NACL 80 MCG/20ML IV SOLN
INTRAVENOUS | Status: AC
  Filled 2023-10-26: qty 20

## 2023-10-26 MED ORDER — ACETAMINOPHEN 500 MG PO TABS
ORAL_TABLET | ORAL | Status: AC
  Administered 2023-10-26: 1000 mg via ORAL
  Filled 2023-10-26: qty 2

## 2023-10-26 MED ORDER — HYDROMORPHONE HCL 1 MG/ML IJ SOLN: 0.5000 mg | INTRAMUSCULAR | Status: DC | PRN

## 2023-10-26 MED ORDER — SODIUM CHLORIDE 0.9 % IR SOLN
Status: DC | PRN
  Administered 2023-10-26: 1000 mL

## 2023-10-26 MED ORDER — ACETAMINOPHEN 325 MG PO TABS: 325.0000 mg | ORAL_TABLET | Freq: Four times a day (QID) | ORAL | Status: DC | PRN

## 2023-10-26 MED ORDER — VANCOMYCIN HCL 1000 MG IV SOLR
INTRAVENOUS | Status: DC | PRN
  Administered 2023-10-26: 1000 mg via TOPICAL

## 2023-10-26 MED ORDER — POVIDONE-IODINE 10 % EX SWAB
2.0000 | Freq: Once | CUTANEOUS | Status: AC
  Administered 2023-10-26: 2 via TOPICAL

## 2023-10-26 MED ORDER — BUPIVACAINE-MELOXICAM ER 400-12 MG/14ML IJ SOLN
INTRAMUSCULAR | Status: DC | PRN
  Administered 2023-10-26: 400 mg

## 2023-10-26 MED ORDER — APIXABAN 2.5 MG PO TABS
2.5000 mg | ORAL_TABLET | Freq: Two times a day (BID) | ORAL | Status: DC
  Administered 2023-10-27: 2.5 mg via ORAL
  Filled 2023-10-26: qty 1

## 2023-10-26 MED ORDER — ONDANSETRON HCL 4 MG/2ML IJ SOLN
INTRAMUSCULAR | Status: AC
  Filled 2023-10-26: qty 2

## 2023-10-26 MED ORDER — CEFAZOLIN SODIUM-DEXTROSE 2-4 GM/100ML-% IV SOLN
INTRAVENOUS | Status: AC
  Filled 2023-10-26: qty 100

## 2023-10-26 MED ORDER — GABAPENTIN 400 MG PO CAPS
800.0000 mg | ORAL_CAPSULE | Freq: Two times a day (BID) | ORAL | Status: DC
Start: 2023-10-26 — End: 2023-10-27
  Administered 2023-10-26 – 2023-10-27 (×2): 800 mg via ORAL
  Filled 2023-10-26 (×2): qty 2

## 2023-10-26 MED ORDER — PHENYLEPHRINE HCL-NACL 20-0.9 MG/250ML-% IV SOLN
INTRAVENOUS | Status: DC | PRN
  Administered 2023-10-26: 20 ug/min via INTRAVENOUS

## 2023-10-26 MED ORDER — TRANEXAMIC ACID-NACL 1000-0.7 MG/100ML-% IV SOLN
1000.0000 mg | INTRAVENOUS | Status: AC
  Administered 2023-10-26: 1000 mg via INTRAVENOUS

## 2023-10-26 MED ORDER — THIAMINE MONONITRATE 100 MG PO TABS
100.0000 mg | ORAL_TABLET | Freq: Every day | ORAL | Status: DC
  Administered 2023-10-26 – 2023-10-27 (×2): 100 mg via ORAL
  Filled 2023-10-26 (×2): qty 1

## 2023-10-26 MED ORDER — MIDAZOLAM HCL 2 MG/2ML IJ SOLN
INTRAMUSCULAR | Status: AC
  Administered 2023-10-26: 2 mg via INTRAVENOUS
  Filled 2023-10-26: qty 2

## 2023-10-26 MED ORDER — DEXMEDETOMIDINE HCL IN NACL 80 MCG/20ML IV SOLN
INTRAVENOUS | Status: DC | PRN
  Administered 2023-10-26 (×3): 4 ug via INTRAVENOUS

## 2023-10-26 MED ORDER — CHLORHEXIDINE GLUCONATE 0.12 % MT SOLN
OROMUCOSAL | Status: AC
  Administered 2023-10-26: 15 mL via OROMUCOSAL
  Filled 2023-10-26: qty 15

## 2023-10-26 MED ORDER — KETOROLAC TROMETHAMINE 15 MG/ML IJ SOLN
7.5000 mg | Freq: Four times a day (QID) | INTRAMUSCULAR | Status: AC
  Administered 2023-10-26 – 2023-10-27 (×4): 7.5 mg via INTRAVENOUS
  Filled 2023-10-26 (×4): qty 1

## 2023-10-26 MED ORDER — MENTHOL 3 MG MT LOZG: 1.0000 | LOZENGE | OROMUCOSAL | Status: DC | PRN

## 2023-10-26 MED ORDER — CEFAZOLIN SODIUM-DEXTROSE 2-4 GM/100ML-% IV SOLN
2.0000 g | INTRAVENOUS | Status: AC
  Administered 2023-10-26: 2 g via INTRAVENOUS

## 2023-10-26 MED ORDER — VANCOMYCIN HCL 1000 MG IV SOLR
INTRAVENOUS | Status: AC
Start: 2023-10-26 — End: ?
  Filled 2023-10-26: qty 20

## 2023-10-26 MED ORDER — TRANEXAMIC ACID-NACL 1000-0.7 MG/100ML-% IV SOLN
INTRAVENOUS | Status: AC
  Filled 2023-10-26: qty 100

## 2023-10-26 MED ORDER — OXYCODONE HCL 5 MG PO TABS
ORAL_TABLET | ORAL | Status: AC
  Filled 2023-10-26: qty 1

## 2023-10-26 MED ORDER — APIXABAN 2.5 MG PO TABS
2.5000 mg | ORAL_TABLET | Freq: Two times a day (BID) | ORAL | 0 refills | Status: DC
  Filled 2023-10-26: qty 60, 30d supply, fill #0

## 2023-10-26 MED ORDER — 0.9 % SODIUM CHLORIDE (POUR BTL) OPTIME
TOPICAL | Status: DC | PRN
  Administered 2023-10-26: 1000 mL

## 2023-10-26 MED ORDER — BUPIVACAINE IN DEXTROSE 0.75-8.25 % IT SOLN
INTRATHECAL | Status: DC | PRN
  Administered 2023-10-26: 1.8 mL via INTRATHECAL

## 2023-10-26 MED ORDER — FOLIC ACID 1 MG PO TABS
1.0000 mg | ORAL_TABLET | Freq: Every day | ORAL | Status: DC
  Administered 2023-10-26 – 2023-10-27 (×2): 1 mg via ORAL
  Filled 2023-10-26 (×2): qty 1

## 2023-10-26 MED ORDER — LORAZEPAM 0.5 MG PO TABS: 1.0000 mg | ORAL_TABLET | ORAL | Status: DC | PRN

## 2023-10-26 MED ORDER — OXYCODONE HCL 5 MG PO TABS
5.0000 mg | ORAL_TABLET | ORAL | Status: DC | PRN
  Filled 2023-10-26: qty 2

## 2023-10-26 MED ORDER — OXYCODONE HCL 5 MG/5ML PO SOLN: 5.0000 mg | Freq: Once | ORAL | Status: AC | PRN

## 2023-10-26 MED ORDER — FENTANYL CITRATE (PF) 250 MCG/5ML IJ SOLN
INTRAMUSCULAR | Status: AC
  Filled 2023-10-26: qty 5

## 2023-10-26 SURGICAL SUPPLY — 72 items
ALCOHOL 70% 16 OZ (MISCELLANEOUS) ×1 IMPLANT
BAG COUNTER SPONGE SURGICOUNT (BAG) IMPLANT
BAG DECANTER FOR FLEXI CONT (MISCELLANEOUS) ×1 IMPLANT
BANDAGE ESMARK 6X9 LF (GAUZE/BANDAGES/DRESSINGS) IMPLANT
BLADE SAG 18X100X1.27 (BLADE) ×1 IMPLANT
BLADE SAW SGTL 73X25 THK (BLADE) ×1 IMPLANT
BOWL SMART MIX CTS (DISPOSABLE) ×1 IMPLANT
CLSR STERI-STRIP ANTIMIC 1/2X4 (GAUZE/BANDAGES/DRESSINGS) ×2 IMPLANT
COMPONENT FEM PS STD KN 12 LT (Joint) IMPLANT
COMPONENT PATELLA 3 PEG 38 (Joint) IMPLANT
COMPONET TIB PS G 0D LT (Joint) IMPLANT
COOLER ICEMAN CLASSIC (MISCELLANEOUS) ×1 IMPLANT
COVER SURGICAL LIGHT HANDLE (MISCELLANEOUS) ×1 IMPLANT
CUFF TOURN SGL QUICK 42 (TOURNIQUET CUFF) IMPLANT
CUFF TRNQT CYL 34X4.125X (TOURNIQUET CUFF) ×1 IMPLANT
DERMABOND ADVANCED .7 DNX12 (GAUZE/BANDAGES/DRESSINGS) ×1 IMPLANT
DRAPE EXTREMITY T 121X128X90 (DISPOSABLE) ×1 IMPLANT
DRAPE HALF SHEET 40X57 (DRAPES) ×1 IMPLANT
DRAPE INCISE IOBAN 66X45 STRL (DRAPES) ×1 IMPLANT
DRAPE POUCH INSTRU U-SHP 10X18 (DRAPES) ×1 IMPLANT
DRAPE SURG ORHT 6 SPLT 77X108 (DRAPES) IMPLANT
DRAPE U-SHAPE 47X51 STRL (DRAPES) ×2 IMPLANT
DRSG AQUACEL AG ADV 3.5X10 (GAUZE/BANDAGES/DRESSINGS) ×1 IMPLANT
DRSG AQUACEL AG ADV 3.5X14 (GAUZE/BANDAGES/DRESSINGS) IMPLANT
DURAPREP 26ML APPLICATOR (WOUND CARE) ×3 IMPLANT
ELECT CAUTERY BLADE 6.4 (BLADE) ×1 IMPLANT
ELECT PENCIL ROCKER SW 15FT (MISCELLANEOUS) ×1 IMPLANT
ELECTRODE REM PT RTRN 9FT ADLT (ELECTROSURGICAL) ×1 IMPLANT
GLOVE BIOGEL PI IND STRL 7.0 (GLOVE) ×2 IMPLANT
GLOVE BIOGEL PI IND STRL 7.5 (GLOVE) ×5 IMPLANT
GLOVE ECLIPSE 7.0 STRL STRAW (GLOVE) ×3 IMPLANT
GLOVE INDICATOR 7.0 STRL GRN (GLOVE) ×1 IMPLANT
GLOVE INDICATOR 7.5 STRL GRN (GLOVE) ×1 IMPLANT
GLOVE SURG SYN 7.5 E (GLOVE) ×2 IMPLANT
GLOVE SURG SYN 7.5 PF PI (GLOVE) ×2 IMPLANT
GLOVE SURG UNDER LTX SZ7.5 (GLOVE) ×2 IMPLANT
GLOVE SURG UNDER POLY LF SZ7 (GLOVE) ×2 IMPLANT
GOWN STRL REUS W/ TWL LRG LVL3 (GOWN DISPOSABLE) ×1 IMPLANT
GOWN STRL SURGICAL XL XLNG (GOWN DISPOSABLE) ×1 IMPLANT
GOWN TOGA ZIPPER T7+ PEEL AWAY (MISCELLANEOUS) ×2 IMPLANT
HOOD PEEL AWAY T7 (MISCELLANEOUS) ×1 IMPLANT
KIT BASIN OR (CUSTOM PROCEDURE TRAY) ×1 IMPLANT
KIT TURNOVER KIT B (KITS) ×1 IMPLANT
LINER TIB ASF PS GH/12 LT (Liner) IMPLANT
MANIFOLD NEPTUNE II (INSTRUMENTS) ×1 IMPLANT
MARKER SKIN DUAL TIP RULER LAB (MISCELLANEOUS) ×2 IMPLANT
NDL SPNL 18GX3.5 QUINCKE PK (NEEDLE) ×1 IMPLANT
NEEDLE SPNL 18GX3.5 QUINCKE PK (NEEDLE) ×1 IMPLANT
NS IRRIG 1000ML POUR BTL (IV SOLUTION) ×1 IMPLANT
PACK TOTAL JOINT (CUSTOM PROCEDURE TRAY) ×1 IMPLANT
PAD ARMBOARD POSITIONER FOAM (MISCELLANEOUS) ×2 IMPLANT
PAD COLD SHLDR WRAP-ON (PAD) ×1 IMPLANT
PIN DRILL HDLS TROCAR 75 4PK (PIN) IMPLANT
SCREW FEMALE HEX FIX 25X2.5 (ORTHOPEDIC DISPOSABLE SUPPLIES) IMPLANT
SET HNDPC FAN SPRY TIP SCT (DISPOSABLE) ×1 IMPLANT
SOLUTION PRONTOSAN WOUND 350ML (IRRIGATION / IRRIGATOR) ×1 IMPLANT
STAPLER VISISTAT 35W (STAPLE) IMPLANT
SUCTION TUBE FRAZIER 10FR DISP (SUCTIONS) ×1 IMPLANT
SUT ETHILON 2 0 FS 18 (SUTURE) IMPLANT
SUT MNCRL AB 3-0 PS2 27 (SUTURE) IMPLANT
SUT STRATAFIX 1PDS 45CM VIOLET (SUTURE) IMPLANT
SUT VIC AB 0 CT1 27XBRD ANBCTR (SUTURE) ×2 IMPLANT
SUT VIC AB 1 CTX 27 (SUTURE) ×3 IMPLANT
SUT VIC AB 2-0 CT1 TAPERPNT 27 (SUTURE) ×4 IMPLANT
SUTURE STRATFX 0 PDS 27 VIOLET (SUTURE) IMPLANT
SYR 50ML LL SCALE MARK (SYRINGE) ×2 IMPLANT
TOWEL GREEN STERILE (TOWEL DISPOSABLE) ×1 IMPLANT
TOWEL GREEN STERILE FF (TOWEL DISPOSABLE) ×1 IMPLANT
TRAY CATH INTERMITTENT SS 16FR (CATHETERS) IMPLANT
TUBE SUCT ARGYLE STRL (TUBING) ×1 IMPLANT
UNDERPAD 30X36 HEAVY ABSORB (UNDERPADS AND DIAPERS) ×1 IMPLANT
YANKAUER SUCT BULB TIP NO VENT (SUCTIONS) ×2 IMPLANT

## 2023-10-26 NOTE — Anesthesia Postprocedure Evaluation (Signed)
 Anesthesia Post Note  Patient: Justin Burnett  Procedure(s) Performed: ARTHROPLASTY, KNEE, TOTAL (Left: Knee)     Patient location during evaluation: PACU Anesthesia Type: MAC and Spinal Level of consciousness: awake Pain management: pain level controlled Vital Signs Assessment: post-procedure vital signs reviewed and stable Respiratory status: spontaneous breathing, respiratory function stable and nonlabored ventilation Cardiovascular status: blood pressure returned to baseline and stable Postop Assessment: no headache, no backache and no apparent nausea or vomiting Anesthetic complications: no   No notable events documented.  Last Vitals:  Vitals:   10/26/23 1230 10/26/23 1245  BP: 134/83 127/87  Pulse: 64 67  Resp: 18 14  Temp:    SpO2: 97% 95%    Last Pain:  Vitals:   10/26/23 0817  TempSrc:   PainSc: 8                  Conard Decent

## 2023-10-26 NOTE — Progress Notes (Signed)
 Orthopedic Tech Progress Note Patient Details:  Justin Burnett Jul 14, 1965 [id number]  Ortho Devices Type of Ortho Device: Bone foam zero knee Ortho Device/Splint Interventions: Ordered, Application   Post Interventions Patient Tolerated: Well  [name] [name] 10/26/2023, 1:12 PM

## 2023-10-26 NOTE — H&P (Signed)
 PREOPERATIVE H&P  Chief Complaint: left knee osteoarthritis  HPI: Justin Burnett is a 58 y.o. male who presents for surgical treatment of left knee osteoarthritis.  He denies any changes in medical history.  Past Surgical History:  Procedure Laterality Date   FINGER SURGERY Left    As a child. Tip of middle finger cut off in wood shop   Social History   Socioeconomic History   Marital status: Married    Spouse name: [name]    Number of children: 1   Years of education: GED   Highest education level: Not on file  Occupational History   Not on file  Tobacco Use   Smoking status: Every Day    Current packs/day: 0.50    Average packs/day: 0.5 packs/day for 13.0 years (6.5 ttl pk-yrs)    Types: Cigarettes    Passive exposure: Current   Smokeless tobacco: Never  Vaping Use   Vaping status: Never Used  Substance and Sexual Activity   Alcohol use: Not Currently    Alcohol/week: 3.0 standard drinks of alcohol    Types: 3 Cans of beer per week   Drug use: No   Sexual activity: Not Currently  Other Topics Concern   Not on file  Social History Narrative   Patient lives at home with his wife ([name]).   Patient is unemployed   Engineer, drilling. School.   Right handed.    Caffeine coffee and mountain dew.   Social Drivers of Health   Financial Resource Strain: Patient Unable To Answer (07/23/2023)   Overall Financial Resource Strain (CARDIA)    Difficulty of Paying Living Expenses: Patient unable to answer  Food Insecurity: Patient Unable To Answer (07/23/2023)   Hunger Vital Sign    Worried About Running Out of Food in the Last Year: Patient unable to answer    Ran Out of Food in the Last Year: Patient unable to answer  Transportation Needs: Patient Unable To Answer (07/23/2023)   PRAPARE - Transportation    Lack of Transportation (Medical): Patient unable to answer    Lack of Transportation (Non-Medical): Patient unable to answer  Physical Activity: Sufficiently  Active (07/23/2023)   Exercise Vital Sign    Days of Exercise per Week: 4 days    Minutes of Exercise per Session: 60 min  Stress: Patient Unable To Answer (07/23/2023)   [employer] of Occupational Health - Occupational Stress Questionnaire    Feeling of Stress : Patient unable to answer  Social Connections: Patient Unable To Answer (07/23/2023)   Social Connection and Isolation Panel [NHANES]    Frequency of Communication with Friends and Family: Patient unable to answer    Frequency of Social Gatherings with Friends and Family: Patient unable to answer    Attends Religious Services: Patient unable to answer    Active Member of Clubs or Organizations: Patient unable to answer    Attends Banker Meetings: Patient unable to answer    Marital Status: Patient unable to answer   Family History  Problem Relation Age of Onset   Colon cancer Neg Hx    Esophageal cancer Neg Hx    Rectal cancer Neg Hx    Stomach cancer Neg Hx    Colon polyps Neg Hx    Allergies  Allergen Reactions   Meperidine Other (See Comments)    Causes seizure-like activities Demerol   Meperidine Hcl Other (See Comments)   Prior to Admission medications   Medication Sig  Start Date End Date Taking? Authorizing Provider  amLODipine  (NORVASC ) 10 MG tablet Take 1 tablet (10 mg total) by mouth daily. 09/01/23  Yes Rogerio Clay, Amy J, NP  amoxicillin -clavulanate (AUGMENTIN ) 875-125 MG tablet Take 1 tablet by mouth 2 (two) times daily. Patient not taking: Reported on 10/15/2023 03/25/23  Yes Rogerio Clay, Amy J, NP  Ascorbic Acid (VITAMIN C) 1000 MG tablet Take 1,000 mg by mouth daily.   Yes [provider]  atorvastatin  (LIPITOR) 40 MG tablet Take 1 tablet (40 mg total) by mouth daily. 09/01/23  Yes [name], NP  Cholecalciferol 25 MCG (1000 UT) capsule Take 1,000 Units by mouth daily.   Yes [provider]  apixaban (ELIQUIS) 2.5 MG TABS tablet Take one tab po bid x 30 days after surgery to  prevent blood clots 10/26/23   Sandie Cross, PA-C  docusate sodium  (COLACE) 100 MG capsule Take 1 capsule (100 mg total) by mouth daily as needed. Patient not taking: Reported on 10/15/2023 10/15/23 10/14/24  Sandie Cross, PA-C  gabapentin  (NEURONTIN ) 800 MG tablet Take 1 tablet (800 mg total) by mouth 2 (two) times daily. 10/20/23   [name], NP  meloxicam  (MOBIC ) 15 MG tablet Take 1 tablet (15 mg total) by mouth daily. 10/19/23   Sandie Cross, PA-C  methocarbamol  (ROBAXIN -750) 750 MG tablet Take 1 tablet (750 mg total) by mouth 3 (three) times daily as needed for muscle spasms. Patient not taking: Reported on 10/15/2023 10/15/23   Sandie Cross, PA-C  ondansetron  (ZOFRAN ) 4 MG tablet Take 1 tablet (4 mg total) by mouth every 8 (eight) hours as needed for nausea or vomiting. Patient not taking: Reported on 10/15/2023 10/15/23   Sandie Cross, PA-C  oxyCODONE -acetaminophen  (PERCOCET) 5-325 MG tablet Take 1-2 tablets by mouth every 6 (six) hours as needed. (to be taken after surgery) Patient not taking: Reported on 10/15/2023 10/15/23   Sandie Cross, PA-C  silver  sulfADIAZINE  (SILVADENE ) 1 % cream Apply 1 Application topically 2 (two) times daily. 03/25/23   [name], NP     Positive ROS: All other systems have been reviewed and were otherwise negative with the exception of those mentioned in the HPI and as above.  Physical Exam: General: Alert, no acute distress Cardiovascular: No pedal edema Respiratory: No cyanosis, no use of accessory musculature GI: abdomen soft Skin: No lesions in the area of chief complaint Neurologic: Sensation intact distally Psychiatric: Patient is competent for consent with normal mood and affect Lymphatic: no lymphedema  MUSCULOSKELETAL: exam stable  Assessment: left knee osteoarthritis  Plan: Plan for Procedure(s): ARTHROPLASTY, KNEE, TOTAL  The risks benefits and alternatives were discussed with the patient including but not limited to the  risks of nonoperative treatment, versus surgical intervention including infection, bleeding, nerve injury,  blood clots, cardiopulmonary complications, morbidity, mortality, among others, and they were willing to proceed.   Claria Crofts, MD 10/26/2023 7:49 AM

## 2023-10-26 NOTE — Evaluation (Signed)
 Physical Therapy Evaluation Patient Details Name: Justin Burnett MRN: [id number] DOB: 06-08-1966 Today's Date: 10/26/2023  History of Present Illness  58 y.o. male presents to [facility] hospital on 10/26/2023 for elective L TKA. PMH includes HTN, HLD, neuropathy.  Clinical Impression  Pt presents to PT with deficits in functional mobility, gait, balance, strength, ROM. Pt is able to ambulate for household distances with support of the RW. PT provides education on the TKR exercise packet and encourages frequent mobilization with staff assistance. PT will follow up tomorrow for a progression of gait and to initiate stair training.        If plan is discharge home, recommend the following: A little help with bathing/dressing/bathroom;Assistance with cooking/housework;Assist for transportation;Help with stairs or ramp for entrance   Can travel by private vehicle        Equipment Recommendations Rolling walker (2 wheels);BSC/3in1  Recommendations for Other Services       Functional Status Assessment Patient has had a recent decline in their functional status and demonstrates the ability to make significant improvements in function in a reasonable and predictable amount of time.     Precautions / Restrictions Precautions Precautions: Fall;Knee Precaution Booklet Issued: Yes (comment) Restrictions Weight Bearing Restrictions Per Provider Order: Yes LLE Weight Bearing Per Provider Order: Weight bearing as tolerated      Mobility  Bed Mobility Overal bed mobility: Needs Assistance Bed Mobility: Supine to Sit, Sit to Supine     Supine to sit: Supervision Sit to supine: Min assist        Transfers Overall transfer level: Needs assistance Equipment used: Rolling walker (2 wheels) Transfers: Sit to/from Stand Sit to Stand: Contact guard assist                Ambulation/Gait Ambulation/Gait assistance: Contact guard assist Gait Distance (Feet): 80 Feet Assistive device:  Rolling walker (2 wheels) Gait Pattern/deviations: Step-through pattern Gait velocity: reduced Gait velocity interpretation: <1.8 ft/sec, indicate of risk for recurrent falls   General Gait Details: slowed step-through gait, increased trunk flexion, reduced stance time on LLE. PT notes varus deformity of R knee  Stairs            Wheelchair Mobility     Tilt Bed    Modified Rankin (Stroke Patients Only)       Balance Overall balance assessment: Needs assistance Sitting-balance support: No upper extremity supported, Feet supported Sitting balance-Leahy Scale: Good     Standing balance support: Bilateral upper extremity supported, Reliant on assistive device for balance Standing balance-Leahy Scale: Poor                               Pertinent Vitals/Pain Pain Assessment Pain Assessment: 0-10 Pain Score: 8  Pain Location: L knee Pain Descriptors / Indicators: Aching Pain Intervention(s): Patient requesting pain meds-RN notified    Home Living Family/patient expects to be discharged to:: Private residence Living Arrangements: Spouse/significant other Available Help at Discharge: Family;Available PRN/intermittently Type of Home: House Home Access: Stairs to enter Entrance Stairs-Rails: Can reach both Entrance Stairs-Number of Steps: 5   Home Layout: One level Home Equipment: None      Prior Function Prior Level of Function : Independent/Modified Independent;Driving;Working/employed             Mobility Comments: self-employed, works part time       Radio producer Extremity Assessment Upper Extremity Assessment: Overall WFL for tasks assessed  Lower Extremity Assessment Lower Extremity Assessment: LLE deficits/detail LLE Deficits / Details: generalized post-op weakness and knee ROM deficits as anticipated on POD 0    Cervical / Trunk Assessment Cervical / Trunk Assessment: Normal  Communication    Communication Communication: No apparent difficulties    Cognition Arousal: Alert Behavior During Therapy: WFL for tasks assessed/performed   PT - Cognitive impairments: No apparent impairments                         Following commands: Intact       Cueing Cueing Techniques: Verbal cues     General Comments General comments (skin integrity, edema, etc.): VSS on RA    Exercises Other Exercises Other Exercises: PT provides education on TKR exercise protocol   Assessment/Plan    PT Assessment Patient needs continued PT services  PT Problem List Decreased strength;Decreased range of motion;Decreased activity tolerance;Decreased balance;Decreased mobility;Decreased knowledge of use of DME;Pain       PT Treatment Interventions DME instruction;Gait training;Functional mobility training;Stair training;Therapeutic activities;Therapeutic exercise;Balance training;Neuromuscular re-education;Cognitive remediation;Patient/family education    PT Goals (Current goals can be found in the Care Plan section)  Acute Rehab PT Goals Patient Stated Goal: to return to independence PT Goal Formulation: With patient Time For Goal Achievement: 10/30/23 Potential to Achieve Goals: Good    Frequency 7X/week     Co-evaluation               AM-PAC PT "6 Clicks" Mobility  Outcome Measure Help needed turning from your back to your side while in a flat bed without using bedrails?: A Little Help needed moving from lying on your back to sitting on the side of a flat bed without using bedrails?: A Little Help needed moving to and from a bed to a chair (including a wheelchair)?: A Little Help needed standing up from a chair using your arms (e.g., wheelchair or bedside chair)?: A Little Help needed to walk in hospital room?: A Little Help needed climbing 3-5 steps with a railing? : A Little 6 Click Score: 18    End of Session Equipment Utilized During Treatment: Gait belt Activity  Tolerance: Patient tolerated treatment well Patient left: in bed;with call bell/phone within reach Nurse Communication: Mobility status PT Visit Diagnosis: Other abnormalities of gait and mobility (R26.89);Muscle weakness (generalized) (M62.81)    Time: 1610-9604 PT Time Calculation (min) (ACUTE ONLY): 27 min   Charges:   PT Evaluation $PT Eval Low Complexity: 1 Low   PT General Charges $$ ACUTE PT VISIT: 1 Visit         Rexie Catena, PT, DPT Acute Rehabilitation Office [phone]   Rexie Catena 10/26/2023, 3:07 PM

## 2023-10-26 NOTE — TOC CAGE-AID Note (Signed)
 Transition of Care [facility]) - CAGE-AID Screening   Patient Details  Name: Justin Burnett MRN: [id number] Date of Birth: 12-Mar-1966  Transition of Care [facility]) CM/SW Contact:    Jonathan Neighbor, RN Phone Number: 10/26/2023, 2:21 PM   Clinical Narrative:  Pt offered inpatient/ outpatient alcohol counseling resources and he declined.   CAGE-AID Screening:

## 2023-10-26 NOTE — Plan of Care (Signed)

## 2023-10-26 NOTE — Anesthesia Procedure Notes (Signed)
 Spinal  Patient location during procedure: OR Start time: 10/26/2023 9:26 AM End time: 10/26/2023 9:30 AM Reason for block: surgical anesthesia Staffing Performed: anesthesiologist  Anesthesiologist: Conard Decent, MD Performed by: Conard Decent, MD Authorized by: Conard Decent, MD   Preanesthetic Checklist Completed: patient identified, IV checked, site marked, risks and benefits discussed, surgical consent, monitors and equipment checked, pre-op evaluation and timeout performed Spinal Block Patient position: sitting Prep: DuraPrep Patient monitoring: blood pressure and continuous pulse ox Approach: midline Location: L3-4 Injection technique: single-shot Needle Needle type: Pencan  Needle gauge: 24 G Needle length: 9 cm Additional Notes Risks and benefits of neuraxial anesthesia including, but not limited to, infection, bleeding, local anesthetic toxicity, headache, hypotension, back pain, block failure, etc. were discussed with the patient. The patient expressed understanding and consented to the procedure. I confirmed that the patient has no bleeding disorders and is not taking blood thinners. I confirmed the patient's last platelet count with the nurse. Monitors were applied. A time-out was performed immediately prior to the procedure. Sterile technique was used throughout the whole procedure.   1 attempt(s)

## 2023-10-26 NOTE — Discharge Instructions (Addendum)

## 2023-10-26 NOTE — Transfer of Care (Signed)
 Immediate Anesthesia Transfer of Care Note  Patient: Justin Burnett  Procedure(s) Performed: ARTHROPLASTY, KNEE, TOTAL (Left: Knee)  Patient Location: PACU  Anesthesia Type:MAC and Spinal  Level of Consciousness: awake  Airway & Oxygen Therapy: Patient Spontanous Breathing  Post-op Assessment: Report given to RN and Post -op Vital signs reviewed and stable  Post vital signs: Reviewed and stable  Last Vitals:  Vitals Value Taken Time  BP 108/81 10/26/23 1152  Temp    Pulse 77 10/26/23 1155  Resp 15 10/26/23 1155  SpO2 97 % 10/26/23 1155  Vitals shown include unfiled device data.  Last Pain:  Vitals:   10/26/23 0817  TempSrc:   PainSc: 8          Complications: No notable events documented.

## 2023-10-26 NOTE — Progress Notes (Signed)
 Unable to arrange [facility] due to patients insurance. Pt is aware.

## 2023-10-26 NOTE — Op Note (Signed)
 Total Knee Arthroplasty Procedure Note  Preoperative diagnosis: Left knee osteoarthritis  Postoperative diagnosis:same  Operative findings: Complete loss of joint space from all 3 compartments Varus deformity, flexion contracture  Operative procedure: Left total knee arthroplasty. CPT [phone]  Surgeon: N. Claria Crofts, MD  Assist: Sharran Decent, PA-C; necessary for the timely completion of procedure and due to complexity of procedure.  Anesthesia: Spinal, regional  Tourniquet time: see anesthesia record  Implants used: Zimmer persona press fit Femur: CR 12 Tibia: G Patella: 38 mm Polyethylene: 12 mm medial congruent  Indication: Justin Burnett is a 58 y.o. year old male with a history of knee pain. Having failed conservative management, the patient elected to proceed with a total knee arthroplasty.  We have reviewed the risk and benefits of the surgery and they elected to proceed after voicing understanding.  Procedure:  After informed consent was obtained and understanding of the risk were voiced including but not limited to bleeding, infection, damage to surrounding structures including nerves and vessels, blood clots, leg length inequality and the failure to achieve desired results, the operative extremity was marked with verbal confirmation of the patient in the holding area.   The patient was then brought to the operating room and transported to the operating room table in the supine position.  A tourniquet was applied to the operative extremity around the upper thigh. The operative limb was then prepped and draped in the usual sterile fashion and preoperative antibiotics were administered.  A time out was performed prior to the start of surgery confirming the correct extremity, preoperative antibiotic administration, as well as team members, implants and instruments available for the case. Correct surgical site was also confirmed with preoperative radiographs. The limb  was then elevated for exsanguination and the tourniquet was inflated. A midline incision was made and a standard medial parapatellar approach was performed.  The patella was everted which showed complete loss of articular cartilage.  The patella was prepared and sized to a 38 mm.  A cover was placed on the patella for protection from retractors.  We then turned our attention to the femur.  The ACL and PCL were sacrificed. Start site was drilled in the femur and the intramedullary distal femoral cutting guide was placed, set at 3 degrees valgus, taking 12 mm of distal resection due to the varus deformity and flexion contracture. The distal cut was made. Osteophytes were then removed.   Next, the proximal tibial cutting guide was placed with appropriate slope, varus/valgus alignment and depth of resection. A drop rod was attached to confirm that it was pointed to the second metatarsal.  The proximal tibial cut was made taking 10 mm off the high side. Gap blocks were then used to assess the extension gap and alignment, and appropriate soft tissue releases were performed. Attention was turned back to the femur, which was sized using the sizing guide to a size 12. Appropriate rotation of the femoral component was determined using epicondylar axis, Whiteside's line, and assessing the flexion gap under ligament tension. The appropriate size 4-in-1 cutting block was placed and cuts were made.  Posterior femoral osteophytes and uncapped bone were then removed with the curved osteotome.  Trial components were placed, and stability was checked in full extension, mid-flexion, and deep flexion.  The patella tracked well without a lateral release. Trial components were then removed and tibial preparation performed.  The tibial bone quality was excellent for press fit.   The tibial trial was pointed  to the medial third of the tibial tubercle.  The tibia was sized for a size G component.  The bony surfaces were irrigated with a  pulse lavage and then dried. Final components were placed.  The stability of the construct was re-evaluated throughout a range of motion and found to be acceptable. The trial liner was removed, the knee was copiously irrigated, and the knee was re-evaluated for any excess bone debris. The real polyethylene liner, 12 mm thick, was inserted and checked to ensure the locking mechanism had engaged appropriately. The tourniquet was deflated and hemostasis was achieved. The wound was irrigated with normal saline.  One gram of vancomycin powder was placed in the surgical bed.  Topical mixture of 0.25% bupivacaine  and meloxicam  was placed in the joint for postoperative pain.  Capsular closure was performed with a #1 statafix in flexion, subcutaneous fat closed with a 0 vicryl suture, then subcutaneous tissue closed with interrupted 2.0 vicryl suture. The skin was then closed with a 2.0 nylon and dermabond. A sterile dressing was applied.  The patient was awakened in the operating room and taken to recovery in stable condition. All sponge, needle, and instrument counts were correct at the end of the case.  Trellis Fries was necessary for opening, closing, retracting, limb positioning and overall facilitation and completion of the surgery.  Position: supine  Complications: none.  Time Out: performed   Drains/Packing: none  Estimated blood loss: minimal  Returned to Recovery Room: in good condition.   Antibiotics: yes   Mechanical VTE (DVT) Prophylaxis: sequential compression devices, TED thigh-high  Chemical VTE (DVT) Prophylaxis: eliquis  Fluid Replacement  Crystalloid: see anesthesia record Blood: none  FFP: none   Specimens Removed: 1 to pathology   Sponge and Instrument Count Correct? yes   PACU: portable radiograph - knee AP and Lateral   Plan/RTC: Return in 2 weeks for wound check.   Weight Bearing/Load Lower Extremity: full   Implant Name Type Inv. Item Serial No. Manufacturer Lot  No. LRB No. Used Action  COMPONENT PATELLA 3 PEG 38 - [id number] Joint COMPONENT PATELLA 3 PEG 38  [employer]IMMER [employer]ON(ORTH,TRAU,BIO,SG) [id number] Left 1 Implanted  COMPONENT FEM PS STD KN 12 LT - [id number] Joint COMPONENT FEM PS STD KN 12 LT  [employer]ON(ORTH,TRAU,BIO,SG) [id number] Left 1 Implanted  COMPONET TIB PS G 0D LT - [id number] Joint COMPONET TIB PS G 0D LT  [employer]ON(ORTH,TRAU,BIO,SG) [id number] Left 1 Implanted  LINER TIB ASF PS GH/12 LT - [id number] Liner LINER TIB ASF PS GH/12 LT  [employer]ON(ORTH,TRAU,BIO,SG) [id number] Left 1 Implanted    N. Claria Crofts, MD [facility] 11:05 AM

## 2023-10-26 NOTE — Anesthesia Procedure Notes (Signed)
 Anesthesia Regional Block: Adductor canal block   Pre-Anesthetic Checklist: , timeout performed,  Correct Patient, Correct Site, Correct Laterality,  Correct Procedure, Correct Position, site marked,  Risks and benefits discussed,  Surgical consent,  Pre-op evaluation,  At surgeon's request and post-op pain management  Laterality: Left  Prep: chloraprep       Needles:  Injection technique: Single-shot  Needle Type: Echogenic Stimulator Needle     Needle Length: 9cm  Needle Gauge: 21     Additional Needles:   Procedures:,,,, ultrasound used (permanent image in chart),,    Narrative:  Start time: 10/26/2023 8:30 AM End time: 10/26/2023 8:33 AM Injection made incrementally with aspirations every 5 mL.  Performed by: Personally  Anesthesiologist: Conard Decent, MD  Additional Notes: Discussed risks and benefits of nerve block including, but not limited to, prolonged and/or permanent nerve injury involving sensory and/or motor function. Monitors were applied and a time-out was performed. The nerve and associated structures were visualized under ultrasound guidance. After negative aspiration, local anesthetic was slowly injected around the nerve. There was no evidence of high pressure during the procedure. There were no paresthesias. VSS remained stable and the patient tolerated the procedure well.

## 2023-10-27 ENCOUNTER — Other Ambulatory Visit: Payer: Self-pay | Admitting: Physician Assistant

## 2023-10-27 ENCOUNTER — Encounter (HOSPITAL_COMMUNITY): Payer: Self-pay | Admitting: Orthopaedic Surgery

## 2023-10-27 DIAGNOSIS — M1712 Unilateral primary osteoarthritis, left knee: Secondary | ICD-10-CM | POA: Diagnosis not present

## 2023-10-27 DIAGNOSIS — Z96652 Presence of left artificial knee joint: Secondary | ICD-10-CM

## 2023-10-27 NOTE — Evaluation (Signed)
 Occupational Therapy Evaluation Patient Details Name: Justin Burnett MRN: [id number] DOB: 11-06-1965 Today's Date: 10/27/2023   History of Present Illness   58 y.o. male presents to [facility] hospital on 10/26/2023 for elective L TKA. PMH includes HTN, HLD, neuropathy.     Clinical Impressions Patient is s/p L TKA surgery resulting in functional limitations due to the deficits listed below (see OT problem list). PT independent at baseline. Pt abel to complete adls MOD I Patient will benefit from skilled OT acutely to increase independence and safety with ADLS to allow discharge home .      If plan is discharge home, recommend the following:   Assist for transportation     Functional Status Assessment   Patient has had a recent decline in their functional status and demonstrates the ability to make significant improvements in function in a reasonable and predictable amount of time.     Equipment Recommendations   Other (comment) (RW)     Recommendations for Other Services         Precautions/Restrictions   Precautions Precautions: Fall;Knee Recall of Precautions/Restrictions: Intact Restrictions Weight Bearing Restrictions Per Provider Order: No LLE Weight Bearing Per Provider Order: Weight bearing as tolerated     Mobility Bed Mobility Overal bed mobility: Modified Independent             General bed mobility comments: educated on proper exit to the bed. pt has water bed at home and wants to exit to the L side    Transfers Overall transfer level: Needs assistance Equipment used: Rolling walker (2 wheels) Transfers: Sit to/from Stand Sit to Stand: Contact guard assist                  Balance Overall balance assessment: Needs assistance Sitting-balance support: Bilateral upper extremity supported, Feet supported Sitting balance-Leahy Scale: Good     Standing balance support: Bilateral upper extremity supported, Reliant on assistive device for  balance Standing balance-Leahy Scale: Poor                             ADL either performed or assessed with clinical judgement   ADL Overall ADL's : Modified independent                                       General ADL Comments: dressed for home, educated on shower transfer     Vision Baseline Vision/History: 1 Wears glasses Vision Assessment?: Wears glasses for reading     Perception         Praxis         Pertinent Vitals/Pain Pain Assessment Pain Assessment: 0-10 Pain Score: 2  Pain Location: L knee Pain Descriptors / Indicators: Aching Pain Intervention(s): Monitored during session, Premedicated before session, Repositioned     Extremity/Trunk Assessment Upper Extremity Assessment Upper Extremity Assessment: Overall WFL for tasks assessed   Lower Extremity Assessment Lower Extremity Assessment: Defer to PT evaluation   Cervical / Trunk Assessment Cervical / Trunk Assessment: Normal   Communication Communication Communication: No apparent difficulties   Cognition Arousal: Alert Behavior During Therapy: WFL for tasks assessed/performed Cognition: No apparent impairments                               Following commands: Intact  Cueing  General Comments   Cueing Techniques: Verbal cues  VSS on RA   Exercises     Shoulder Instructions      Home Living Family/patient expects to be discharged to:: Private residence Living Arrangements: Spouse/significant other Available Help at Discharge: Family;Available PRN/intermittently Type of Home: House Home Access: Stairs to enter [employer] of Steps: 5 Entrance Stairs-Rails: Can reach both Home Layout: One level     Bathroom Shower/Tub: Producer, television/film/video: Standard     Home Equipment: Grab bars - toilet;Shower seat          Prior Functioning/Environment Prior Level of Function : Independent/Modified  Independent;Driving;Working/employed             Mobility Comments: self-employed, works part time      OT Problem List: Impaired balance (sitting and/or standing);Decreased activity tolerance;Decreased safety awareness   OT Treatment/Interventions:        OT Goals(Current goals can be found in the care plan section)   Acute Rehab OT Goals Patient Stated Goal: to go home. plans to mow his yard but advised to speak to staff prior Potential to Achieve Goals: Good   OT Frequency:       Co-evaluation              AM-PAC OT "6 Clicks" Daily Activity     Outcome Measure Help from another person eating meals?: None Help from another person taking care of personal grooming?: None Help from another person toileting, which includes using toliet, bedpan, or urinal?: A Little Help from another person bathing (including washing, rinsing, drying)?: A Little Help from another person to put on and taking off regular upper body clothing?: None Help from another person to put on and taking off regular lower body clothing?: A Little 6 Click Score: 21   End of Session Equipment Utilized During Treatment: Rolling walker (2 wheels) Nurse Communication: Mobility status;Precautions  Activity Tolerance: Patient tolerated treatment well Patient left: in bed;with call bell/phone within reach;with family/visitor present  OT Visit Diagnosis: Muscle weakness (generalized) (M62.81)                Time: 2956-2130 OT Time Calculation (min): 29 min Charges:  OT General Charges $OT Visit: 1 Visit OT Evaluation $OT Eval Moderate Complexity: 1 Mod OT Treatments $Self Care/Home Management : 8-22 mins   Brynn, OTR/L  Acute Rehabilitation Services Office: [phone] .   Neomia Banner 10/27/2023, 8:50 AM

## 2023-10-27 NOTE — Progress Notes (Addendum)
 Subjective: 1 Day Post-Op Procedure(s) (LRB): ARTHROPLASTY, KNEE, TOTAL (Left) Patient reports pain as mild.    Objective: Vital signs in last 24 hours: Temp:  [97.9 F (36.6 C)-98.9 F (37.2 C)] 98.1 F (36.7 C) (05/13 0537) Pulse Rate:  [61-82] 69 (05/13 0537) Resp:  [7-20] 18 (05/13 0537) BP: (124-138)/(72-92) 133/86 (05/13 0537) SpO2:  [95 %-100 %] 96 % (05/13 0537)  Intake/Output from previous day: 05/12 0701 - 05/13 0700 In: 2080 [P.O.:1080; I.V.:800; IV Piggyback:200] Out: 1350 [Urine:1200; Blood:150] Intake/Output this shift: No intake/output data recorded.  No results for input(s): "HGB" in the last 72 hours. No results for input(s): "WBC", "RBC", "HCT", "PLT" in the last 72 hours. No results for input(s): "NA", "K", "CL", "CO2", "BUN", "CREATININE", "GLUCOSE", "CALCIUM " in the last 72 hours. No results for input(s): "LABPT", "INR" in the last 72 hours.  Neurologically intact Neurovascular intact Sensation intact distally Intact pulses distally Dorsiflexion/Plantar flexion intact Incision: scant drainage No cellulitis present Compartment soft   Assessment/Plan: 1 Day Post-Op Procedure(s) (LRB): ARTHROPLASTY, KNEE, TOTAL (Left) Advance diet Up with therapy D/C IV fluids WBAT LLE Aquacel to be changed prior to d/c Insurance does not approve hhpt so will put in order for oppt D/c home once cleared by PT      Sandie Cross 10/27/2023, 8:03 AM

## 2023-10-27 NOTE — Progress Notes (Signed)
 Physical Therapy Treatment Patient Details Name: Justin Burnett MRN: [id number] DOB: [birth year] Today's Date: 10/27/2023   History of Present Illness 58 y.o. male presents to [facility] hospital on 10/26/2023 for elective L TKA. PMH includes HTN, HLD, neuropathy.    PT Comments  Pt received standing in room without RW, wife present. Education given to pt and wife on importance of using RW for all mobility right now for safety but also until pt has normal gait pattern, caveat to this is that pt has significant deformity R knee and needs R TKA soon as well, though that is more reason for consistent use of RW. Reviewed bed mobility and encouraged pt to sleep in other bed that is not a water bed for first week as pt's method of getting out of water bed is to put L knee "patella down" onto bed as he puts wt on RLE. Pt voices understanding. Pt practiced exercises, ambulation and stairs. Ready for d/c home, wife will be with him. Rec outpt PT.     If plan is discharge home, recommend the following: A little help with bathing/dressing/bathroom;Assistance with cooking/housework;Assist for transportation;Help with stairs or ramp for entrance   Can travel by private vehicle        Equipment Recommendations  Rolling walker (2 wheels);BSC/3in1    Recommendations for Other Services       Precautions / Restrictions Precautions Precautions: Fall;Knee Precaution Booklet Issued: No Recall of Precautions/Restrictions: Intact Precaution/Restrictions Comments: reviewed proper positioning as well as consistent use of RW for healing and fall prevention as pt tends to leave RW and walk away with significant limp Restrictions Weight Bearing Restrictions Per Provider Order: No LLE Weight Bearing Per Provider Order: Weight bearing as tolerated     Mobility  Bed Mobility Overal bed mobility: Modified Independent Bed Mobility: Supine to Sit, Sit to Supine     Supine to sit: Modified independent (Device/Increase  time) Sit to supine: Modified independent (Device/Increase time)   General bed mobility comments: educated on proper exit to the bed. pt has water bed at home and wants to exit to the L side and put L knee patella down on bed as he puts wt on R. Discussed my concerns with this and encouraged pt to use other bed at home for first week and exit R without pressure on LLE. Wife present and understands concerns    Transfers Overall transfer level: Modified independent Equipment used: Rolling walker (2 wheels) Transfers: Sit to/from Stand Sit to Stand: Modified independent (Device/Increase time)           General transfer comment: pt stands safely from bed and chair, no AD, but again reinforced having RW right there to grab as soon as he gets up    Ambulation/Gait Ambulation/Gait assistance: Modified independent (Device/Increase time) Gait Distance (Feet): 200 Feet Assistive device: Rolling walker (2 wheels) Gait Pattern/deviations: Step-through pattern Gait velocity: reduced Gait velocity interpretation: 1.31 - 2.62 ft/sec, indicative of limited community ambulator   General Gait Details: pt with increased toe in and varus deformity R, pt hoping to get R TKA in next few months. Puts increased wt through UE's on RW, vc's for erect posture   Stairs Stairs: Yes Stairs assistance: Supervision Stair Management: Two rails, Step to pattern, Forwards Number of Stairs: 6 General stair comments: vc's for sequencing, pt safe with use of B rails   Wheelchair Mobility     Tilt Bed    Modified Rankin (Stroke Patients Only)  Balance Overall balance assessment: Needs assistance Sitting-balance support: Bilateral upper extremity supported, Feet supported Sitting balance-Leahy Scale: Good     Standing balance support: Bilateral upper extremity supported, Reliant on assistive device for balance Standing balance-Leahy Scale: Poor Standing balance comment: pt has issues with  buckling with B knees, needs to consistently use RW for safety at this time                            Communication Communication Communication: No apparent difficulties  Cognition Arousal: Alert Behavior During Therapy: WFL for tasks assessed/performed   PT - Cognitive impairments: No apparent impairments                       PT - Cognition Comments: intact but needs frequent reinforcement of safety measures Following commands: Intact      Cueing Cueing Techniques: Verbal cues  Exercises Total Joint Exercises Quad Sets: AROM, Left, 10 reps, Seated Heel Slides: AROM, Left, 10 reps, Seated Straight Leg Raises: AROM, Left, 10 reps, Seated Long Arc Quad: AROM, Left, 10 reps, Seated Knee Flexion: AROM, Left, 10 reps, Seated Goniometric ROM: 5-90    General Comments General comments (skin integrity, edema, etc.): VSS. Discussed activity modifications and freqency for home      Pertinent Vitals/Pain Pain Assessment Pain Assessment: Faces Faces Pain Scale: Hurts even more Pain Location: L knee Pain Descriptors / Indicators: Aching Pain Intervention(s): Limited activity within patient's tolerance, Monitored during session    Home Living Family/patient expects to be discharged to:: Private residence Living Arrangements: Spouse/significant other Available Help at Discharge: Family;Available PRN/intermittently Type of Home: House Home Access: Stairs to enter Entrance Stairs-Rails: Can reach both Entrance Stairs-Number of Steps: 5   Home Layout: One level Home Equipment: Grab bars - toilet;Shower seat      Prior Function            PT Goals (current goals can now be found in the care plan section) Acute Rehab PT Goals Patient Stated Goal: to return to independence PT Goal Formulation: With patient Time For Goal Achievement: 10/30/23 Potential to Achieve Goals: Good Progress towards PT goals: Goals met/education completed, patient discharged from  PT    Frequency    7X/week      PT Plan      Co-evaluation              AM-PAC PT "6 Clicks" Mobility   Outcome Measure  Help needed turning from your back to your side while in a flat bed without using bedrails?: None Help needed moving from lying on your back to sitting on the side of a flat bed without using bedrails?: None Help needed moving to and from a bed to a chair (including a wheelchair)?: None Help needed standing up from a chair using your arms (e.g., wheelchair or bedside chair)?: None Help needed to walk in hospital room?: None Help needed climbing 3-5 steps with a railing? : A Little 6 Click Score: 23    End of Session   Activity Tolerance: Patient tolerated treatment well Patient left: in bed;with call bell/phone within reach;with family/visitor present Nurse Communication: Mobility status PT Visit Diagnosis: Other abnormalities of gait and mobility (R26.89);Muscle weakness (generalized) (M62.81)     Time: 1610-9604 PT Time Calculation (min) (ACUTE ONLY): 35 min  Charges:    $Gait Training: 8-22 mins $Therapeutic Exercise: 8-22 mins PT General Charges $$ ACUTE PT VISIT: 1 Visit  Amey Ka, PT  Acute Rehab Services Secure chat preferred Office [phone]    Sharman Debar 10/27/2023, 9:50 AM

## 2023-10-27 NOTE — Plan of Care (Signed)
  Problem: Education: Goal: Knowledge of General Education information will improve Description: Including pain rating scale, medication(s)/side effects and non-pharmacologic comfort measures Outcome: Completed/Met   Problem: Health Behavior/Discharge Planning: Goal: Ability to manage health-related needs will improve Outcome: Completed/Met   Problem: Clinical Measurements: Goal: Ability to maintain clinical measurements within normal limits will improve Outcome: Completed/Met Goal: Will remain free from infection Outcome: Completed/Met Goal: Diagnostic test results will improve Outcome: Completed/Met Goal: Respiratory complications will improve Outcome: Completed/Met Goal: Cardiovascular complication will be avoided Outcome: Completed/Met   Problem: Activity: Goal: Risk for activity intolerance will decrease Outcome: Completed/Met   Problem: Nutrition: Goal: Adequate nutrition will be maintained Outcome: Completed/Met   Problem: Coping: Goal: Level of anxiety will decrease Outcome: Completed/Met   Problem: Elimination: Goal: Will not experience complications related to bowel motility Outcome: Completed/Met Goal: Will not experience complications related to urinary retention Outcome: Completed/Met   Problem: Pain Managment: Goal: General experience of comfort will improve and/or be controlled Outcome: Completed/Met   Problem: Safety: Goal: Ability to remain free from injury will improve Outcome: Completed/Met   Problem: Skin Integrity: Goal: Risk for impaired skin integrity will decrease Outcome: Completed/Met   Problem: Education: Goal: Knowledge of the prescribed therapeutic regimen will improve Outcome: Completed/Met Goal: Individualized Educational Video(s) Outcome: Completed/Met   Problem: Activity: Goal: Ability to avoid complications of mobility impairment will improve Outcome: Completed/Met Goal: Range of joint motion will improve Outcome:  Completed/Met   Problem: Clinical Measurements: Goal: Postoperative complications will be avoided or minimized Outcome: Completed/Met   Problem: Pain Management: Goal: Pain level will decrease with appropriate interventions Outcome: Completed/Met   Problem: Skin Integrity: Goal: Will show signs of wound healing Outcome: Completed/Met

## 2023-10-27 NOTE — Discharge Summary (Signed)
 Patient ID: Justin Burnett MRN: [id number] DOB/AGE: 60-Jun-1967 60 y.o.  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 male who was admitted 10/26/2023 for operative treatment ofStatus post total left knee replacement. Patient has severe unremitting pain that affects sleep, daily activities, and work/hobbies. After pre-op clearance the patient was taken to the operating room on 10/26/2023 and underwent  Procedure(s): ARTHROPLASTY, KNEE, TOTAL.    Patient was given perioperative antibiotics:  Anti-infectives (From admission, onward)    Start     Dose/Rate Route Frequency Ordered Stop   10/26/23 1200  ceFAZolin (ANCEF) IVPB 2g/100 mL premix        2 g 200 mL/hr over 30 Minutes Intravenous Every 6 hours 10/26/23 1149 10/26/23 1934   10/26/23 1101  vancomycin (VANCOCIN) powder  Status:  Discontinued          As needed 10/26/23 1101 10/26/23 1148   10/26/23 0815  ceFAZolin (ANCEF) IVPB 2g/100 mL premix        2 g 200 mL/hr over 30 Minutes Intravenous On call to O.R. 10/26/23 0805 10/26/23 1000   10/26/23 0807  ceFAZolin (ANCEF) 2-4 GM/100ML-% IVPB       Note to Pharmacy: Catherine Cloud: cabinet override      10/26/23 0807 10/26/23 0937        Patient was given sequential compression devices, early ambulation, and chemoprophylaxis to  prevent DVT.  Patient benefited maximally from hospital stay and there were no complications.    Recent vital signs: Patient Vitals for the past 24 hrs:  BP Temp Temp src Pulse Resp SpO2  10/27/23 0537 133/86 98.1 F (36.7 C) Oral 69 18 96 %  10/27/23 0029 (!) 137/92 98.7 F (37.1 C) Oral 82 20 97 %  10/26/23 2147 138/89 98.4 F (36.9 C) Oral 69 18 98 %  10/26/23 1643 (!) 131/92 98 F (36.7 C) Oral 71 20 98 %  10/26/23 1316 130/82 98.9 F (37.2 C) -- 61 20 100 %  10/26/23 1300 135/89 98.8 F (37.1 C) -- 72 (!) 7 96 %  10/26/23 1245 127/87 -- -- 67 14 95 %  10/26/23 1230 134/83 -- -- 64 18 97 %  10/26/23 1215 124/80 -- -- 76 17 96 %  10/26/23 1200 134/72 -- -- 67 13 96 %  10/26/23 1151 -- 97.9 F (36.6 C) -- -- -- --  10/26/23 0840 124/84 -- -- 78 14 100 %  10/26/23 0835 129/85 -- -- 76 15 100 %  10/26/23 0830 (!) 136/91 -- -- 82 15 100 %  10/26/23 0825 -- -- -- 76 13 100 %  10/26/23 0820 -- -- -- 80 -- 97 %     Recent laboratory studies: No results for input(s): "WBC", "HGB", "HCT", "PLT", "NA", "K", "CL", "CO2", "BUN", "CREATININE", "GLUCOSE", "INR", "CALCIUM " in the last 72 hours.  Invalid input(s): "PT", "2"  Discharge Medications:   Allergies as of 10/27/2023       Reactions   Meperidine Other (See Comments)   Causes seizure-like activities Demerol   Meperidine Hcl Other (See Comments)        Medication List     STOP taking these medications    amoxicillin -clavulanate 875-125 MG tablet Commonly known as: AUGMENTIN    meloxicam  15 MG tablet Commonly known as: Mobic        TAKE these medications    amLODipine  10 MG tablet Commonly known as: NORVASC  Take 1 tablet (10 mg total) by mouth daily.   apixaban 2.5 MG Tabs tablet Commonly known as: Eliquis Take 1 tablet (2.5 mg total) by mouth 2 (two) times daily for 30 days after surgery to prevent blood clots   atorvastatin  40 MG tablet Commonly known as: LIPITOR Take 1 tablet (40 mg total) by mouth  daily.   Cholecalciferol 25 MCG (1000 UT) capsule Take 1,000 Units by mouth daily.   docusate sodium  100 MG capsule Commonly known as: Colace Take 1 capsule (100 mg total) by mouth daily as needed.   gabapentin  800 MG tablet Commonly known as: NEURONTIN  Take 1 tablet (800 mg total) by mouth 2 (two) times daily.   methocarbamol  750 MG tablet Commonly known as: Robaxin -750 Take 1 tablet (750 mg total) by mouth 3 (three) times daily as needed for muscle spasms.   ondansetron  4 MG tablet Commonly known as: Zofran  Take 1 tablet (4 mg total) by mouth every 8 (eight) hours as needed for nausea or vomiting.   oxyCODONE -acetaminophen  5-325 MG tablet Commonly known as: Percocet Take 1-2 tablets by mouth every 6 (six) hours as needed. (to be taken after surgery)   SSD 1 % cream Generic drug: silver  sulfADIAZINE  Apply 1 Application topically 2 (two) times daily.   vitamin C 1000 MG tablet Take 1,000 mg by mouth daily.               Durable Medical Equipment  (From admission, onward)           Start     Ordered   10/26/23 1150  DME Walker rolling  Once       Question Answer Comment  Walker: With 5 Inch Wheels   Patient needs a walker to treat with the following condition Status post left partial knee replacement      10/26/23 1149   10/26/23 1150  DME 3 n 1  Once        10/26/23 1149   10/26/23 1150  DME Bedside commode  Once       Question:  Patient needs a bedside commode to treat with the following condition  Answer:  Status post left partial knee replacement   10/26/23 1149            Diagnostic Studies: DG Knee Left Port Result Date: 10/26/2023 CLINICAL DATA:  Status post total left knee arthroplasty. EXAM: PORTABLE LEFT KNEE - 1-2 VIEW COMPARISON:  Left knee radiographs 09/03/2023 FINDINGS: Interval total left knee arthroplasty. No perihardware lucency is seen to indicate hardware failure or loosening. Expected postoperative changes including  intra-articular and subcutaneous air. Mild-to-moderatejoint effusion. No acute fracture or dislocation. IMPRESSION: Interval total left knee arthroplasty without evidence of hardware failure. Electronically Signed   By: Bertina Broccoli M.D.   On: 10/26/2023 16:37    Disposition: Discharge disposition: 01-Home or Self Care          Follow-up Information     Sandie Cross,  PA-C. Schedule an appointment as soon as possible for a visit in 2 week(s).   Specialty: Orthopedic Surgery Contact information: [address] [place]  [place] [address] [phone]                  Signed: Sandie Cross 10/27/2023, 8:07 AM

## 2023-10-27 NOTE — Progress Notes (Signed)

## 2023-10-28 ENCOUNTER — Telehealth: Payer: Self-pay

## 2023-10-28 NOTE — Telephone Encounter (Signed)
 Patient called stating that he has a red rash on the inside of his left thigh.  Stated no itching and just noticed the rash this morning.   Patient stated that he is not able to use the ice machine due to an issue with the power cord.  Would like to know if it can be replaced?  Left knee surgery on 10/26/2023.  CB# [phone].  Please advise.  Thank you.

## 2023-10-28 NOTE — Transitions of Care (Post Inpatient/ED Visit) (Signed)
   10/28/2023  Name: Justin Burnett MRN: [id number] DOB: Jun 04, 1966  Today's TOC FU Call Status: Today's TOC FU Call Status:: Unsuccessful Call (1st Attempt) Unsuccessful Call (1st Attempt) Date: 10/28/23  Attempted to reach the patient regarding the most recent Inpatient/ED visit.  Follow Up Plan: Additional outreach attempts will be made to reach the patient to complete the Transitions of Care (Post Inpatient/ED visit) call.   Signature  Burnett Carson, RN

## 2023-10-28 NOTE — Telephone Encounter (Signed)
 thx

## 2023-10-28 NOTE — Telephone Encounter (Signed)
 Copied from CRM [id number]. Topic: General - Other >> Oct 28, 2023  3:50 PM Emylou G wrote:  Reason for CRM: Patient return call: Sport and exercise psychologist.. clinic adv will call him back

## 2023-10-29 ENCOUNTER — Other Ambulatory Visit (HOSPITAL_COMMUNITY): Payer: Self-pay

## 2023-10-29 ENCOUNTER — Telehealth: Payer: Self-pay

## 2023-10-29 NOTE — Telephone Encounter (Signed)
Call returned to patient and documented in [facility] call today

## 2023-10-29 NOTE — Transitions of Care (Post Inpatient/ED Visit) (Signed)
 10/29/2023  Name: Justin Justin MRN: [id number] DOB: 26-Aug-1965  Today's TOC FU Call Status: Today's TOC FU Call Status:: Successful TOC FU Call Completed Unsuccessful Call (1st Attempt) Date: 10/28/23 [facility] FU Call Complete Date: 10/29/23 [name] and Date of Birth confirmed.  Transition Care Management Follow-up Telephone Call Date of Discharge: 10/27/23 Discharge Facility: Justin Justin [facility]) Type of Discharge: Inpatient Admission Primary Inpatient Discharge Diagnosis:: s/p left TKR How have you been since you were released from the hospital?: Same (He said he is feeling " up and down," just sore.) Any questions or concerns?: Yes Patient Questions/Concerns:: He is concerned about a rash that developed on the inside of his left thigh.  He also is concerned becasue his ice machine is not working. He has contacted the orthopedic surgeon about these issues but has not heard back. Patient Questions/Concerns Addressed: Other: (I instructed the patient and his wife to contact the orhtopedic surgeon again and they said they would)  Items Reviewed: Did you receive and understand the discharge instructions provided?: Yes Medications obtained,verified, and reconciled?: Yes (Medications Reviewed) (He did not have any questions about the med regime,) Any new allergies since your discharge?: No Dietary orders reviewed?: Yes Type of Diet Ordered:: heart healthy, low sodium Do you have support at home?: Yes People in Home [RPT]: spouse Name of Support/Comfort Primary Source: his wife  Medications Reviewed Today: Medications Reviewed Today     Reviewed by Justin Carson, RN (Case Manager) on 10/29/23 at 1141  Med List Status: <None>   Medication Order Taking? Sig Documenting Provider Last Dose Status Informant  amLODipine  (NORVASC ) 10 MG tablet [id number] No Take 1 tablet (10 mg total) by mouth daily. Justin Dama, NP 10/26/2023  6:00 AM Active Self  apixaban (ELIQUIS) 2.5 MG TABS tablet  [id number]  Take 1 tablet (2.5 mg total) by mouth 2 (two) times daily for 30 days after surgery to prevent blood clots Justin Cross, PA-C  Active            Med Note (Justin, Justin Dirks Oct 26, 2023  8:12 AM) Has not start it yet  Ascorbic Acid (VITAMIN C) 1000 MG tablet [id number] No Take 1,000 mg by mouth daily. [provider] 10/26/2023  6:00 AM Active Self  atorvastatin  (LIPITOR) 40 MG tablet [id number] No Take 1 tablet (40 mg total) by mouth daily. Justin Dama, NP 10/26/2023  6:00 AM Active Self  Cholecalciferol 25 MCG (1000 UT) capsule [id number] No Take 1,000 Units by mouth daily. [provider] 10/26/2023  6:00 AM Active Self  docusate sodium  (COLACE) 100 MG capsule [id number] No Take 1 capsule (100 mg total) by mouth daily as needed.  Patient not taking: Reported on 10/15/2023   Justin Cross, PA-C Not Taking Active   gabapentin  (NEURONTIN ) 800 MG tablet [id number] No Take 1 tablet (800 mg total) by mouth 2 (two) times daily. Justin Dama, NP 10/26/2023  6:00 AM Active   methocarbamol  (ROBAXIN -750) 750 MG tablet [id number] No Take 1 tablet (750 mg total) by mouth 3 (three) times daily as needed for muscle spasms.  Patient not taking: Reported on 10/15/2023   Justin Cross, PA-C Not Taking Active   ondansetron  (ZOFRAN ) 4 MG tablet [id number] No Take 1 tablet (4 mg total) by mouth every 8 (eight) hours as needed for nausea or vomiting.  Patient not taking: Reported on 10/15/2023   Justin Cross, PA-C Not Taking Active   oxyCODONE -acetaminophen  (PERCOCET) 5-325  MG tablet [id number] No Take 1-2 tablets by mouth every 6 (six) hours as needed. (to be taken after surgery)  Patient not taking: Reported on 10/15/2023   Justin Cross, PA-C Not Taking Active   silver  sulfADIAZINE  (SILVADENE ) 1 % cream [id number] No Apply 1 Application topically 2 (two) times daily. Justin Dama, NP Taking Active Self            Home Care and Equipment/Supplies: Were Home  Health Services Ordered?: No Any new equipment or medical supplies ordered?: Yes Name of Medical supply agency?: the orthopedic surgeon ordered an ice machine for the patient Were you able to get the equipment/medical supplies?: Yes Do you have any questions related to the use of the equipment/supplies?: Yes What questions do you have?: the ice machine is not working. patient to call the surgeon's office again.  Functional Questionnaire: Do you need assistance with bathing/showering or dressing?: No Do you need assistance with meal preparation?: Yes (his wife assists) Do you need assistance with eating?: No Do you have difficulty maintaining continence: No Do you need assistance with getting out of bed/getting out of a chair/moving?: Yes (ambulates with RW ,  WBAT LLE) Do you have difficulty managing or taking your medications?: No  Follow up appointments reviewed: PCP Follow-up appointment confirmed?: Yes Date of PCP follow-up appointment?: 01/20/24 Follow-up Provider: Lavona Pounds, NP Specialist Hospital Follow-up appointment confirmed?: Yes Date of Specialist follow-up appointment?: 11/10/23 Follow-Up Specialty Provider:: orthopedic surgeon Do you need transportation to your follow-up appointment?: No (His wife plans to take him; but I also provided them with the number for [employer] [facility] [employer]: [phone]) Do you understand care options if your condition(s) worsen?: Yes-patient verbalized understanding    SIGNATURE Justin Carson, RN

## 2023-10-29 NOTE — Therapy (Unsigned)
 OUTPATIENT PHYSICAL THERAPY LOWER EXTREMITY EVALUATION   Patient Name: Justin Burnett MRN: [id number] DOB:05/19/1966, 58 y.o., male Today's Date: 10/30/2023  END OF SESSION:  PT End of Session - 10/30/23 0922     Visit Number 1    Number of Visits 16    Date for PT Re-Evaluation 12/30/23    Authorization Type MCD    PT Start Time 0915    PT Stop Time 1000    PT Time Calculation (min) 45 min    Activity Tolerance Patient tolerated treatment well    Behavior During Therapy [facility] for tasks assessed/performed             Past Medical History:  Diagnosis Date   Abnormal EKG    Chronic bilateral low back pain without sciatica 03/13/2022   Essential (primary) hypertension 09/26/2021   Hyperlipidemia 10/17/2021   Hypertension    Nerve damage    Neuropathy    Osteoarthritis 11/03/2011   Osteoarthritis of knees, bilateral 06/13/2022   Paresthesia 03/13/2022   Prediabetes 10/17/2021   Past Surgical History:  Procedure Laterality Date   FINGER SURGERY Left    As a child. Tip of middle finger cut off in wood shop   TOTAL KNEE ARTHROPLASTY Left 10/26/2023   Procedure: ARTHROPLASTY, KNEE, TOTAL;  Surgeon: Justin Hamman, MD;  Location: [facility] OR;  Service: Orthopedics;  Laterality: Left;   Patient Active Problem List   Diagnosis Date Noted   Status post total left knee replacement 10/26/2023   Preop cardiovascular exam 08/11/2023   Obesity (BMI 30.0-34.9) 08/11/2023   Cardiac murmur 08/11/2023   Cigarette smoker 08/11/2023   Prominent abdominal aortic pulsation 08/11/2023   Hypertension    Nerve damage    Neuromuscular disorder (HCC)    Abnormal EKG    Osteoarthritis of knees, bilateral 06/13/2022   Paresthesia 03/13/2022   Chronic bilateral low back pain without sciatica 03/13/2022   Hyperlipidemia 10/17/2021   Prediabetes 10/17/2021   Essential (primary) hypertension 09/26/2021   Neuropathy    Osteoarthritis 11/03/2011    PCP: Justin Dama, NP   REFERRING  PROVIDER: Sandie Cross, PA-C  REFERRING DIAG: [phone] (ICD-10-CM) - Hx of total knee replacement, left  THERAPY DIAG:  Acute pain of right knee  Other abnormalities of gait and mobility  History of total right knee replacement  Rationale for Evaluation and Treatment: Rehabilitation  ONSET DATE: 10/26/23 DOS  SUBJECTIVE:   SUBJECTIVE STATEMENT: Patient relates a year plus history L knee pain.  Underwent L TKA on 10/26/23  PERTINENT HISTORY: Justin Burnett is a very pleasant 58 year old gentleman with end-stage varus DJD of both knees. Treatment options were discussed again. We agreed to do a right knee cortisone injection with plans to do a left total knee arthroplasty in the near future. He will call Justin Burnett to schedule surgery. Risk benefits prognosis reviewed.  PAIN:  Are you having pain? Yes: NPRS scale: 4-8/10 Pain location: L knee Pain description: ache, sore, throb Aggravating factors: activity Relieving factors: rest ice  PRECAUTIONS: Fall  RED FLAGS: None   WEIGHT BEARING RESTRICTIONS: No  FALLS:  Has patient fallen in last 6 months? No  OCCUPATION: not working  PLOF: Independent  PATIENT GOALS: To get my knee working again  NEXT MD VISIT: 2 weeks  OBJECTIVE:  Note: Objective measures were completed at Evaluation unless otherwise noted.  DIAGNOSTIC FINDINGS: none avaialble  PATIENT SURVEYS:  LEFS 6/80  MUSCLE LENGTH: Not tested  POSTURE: R knee varus  PALPATION: deferred  LOWER EXTREMITY ROM:  A/PROM Right eval Left eval  Hip flexion    Hip extension    Hip abduction    Hip adduction    Hip internal rotation    Hip external rotation    Knee flexion  70/85d  Knee extension  /-3d  Ankle dorsiflexion    Ankle plantarflexion    Ankle inversion    Ankle eversion     (Blank rows = not tested)  LOWER EXTREMITY MMT:  MMT Right eval Left eval  Hip flexion    Hip extension    Hip abduction    Hip adduction    Hip internal rotation     Hip external rotation    Knee flexion  3  Knee extension  3-  Ankle dorsiflexion    Ankle plantarflexion    Ankle inversion    Ankle eversion     (Blank rows = not tested)  LOWER EXTREMITY SPECIAL TESTS:  deferred  FUNCTIONAL TESTS:  30 seconds chair stand test 0 with RW 2 MWT 137ft  GAIT: Distance walked: 57ft x2 Assistive device utilized: Environmental consultant - 2 wheeled Level of assistance: Complete Independence Comments: antalgic gait                                                                                                                                TREATMENT DATE:  [facility] Adult PT Treatment:                                                DATE: 10/30/23 Eval and HEP Self Care: Additional minutes spent for educating on updated Therapeutic Home Exercise Program as well as comparing current status to condition at start of symptoms. This included exercises focusing on stretching, strengthening, with focus on eccentric aspects. Long term goals include an improvement in range of motion, strength, endurance as well as avoiding reinjury. Patient's frequency would include in 1-2 times a day, 3-5 times a week for a duration of 6-12 weeks. Proper technique shown and discussed handout in great detail. All questions were discussed and addressed.      PATIENT EDUCATION:  Education details: Discussed eval findings, rehab rationale and POC and patient is in agreement  Person educated: Patient Education method: Explanation Education comprehension: verbalized understanding and needs further education  HOME EXERCISE PROGRAM: Access Code: [email or web address] URL: [email or web address] Date: 10/30/2023 Prepared by: Justin Burnett  Exercises - Supine Quad Set  - 5 x daily - 5 x weekly - 1 sets - 10 reps - 3s hold - Supine Heel Slide with Strap  - 5 x daily - 5 x weekly - 1 sets - 10 reps - Seated Heel Slide  - 5 x daily - 5 x weekly - 1 sets - 10 reps - 3s hold - Heel Toe Raises with  Counter Support  - 5 x daily - 5 x weekly - 1 sets - 10 reps  ASSESSMENT:  CLINICAL IMPRESSION: Patient is a 58 y.o. male who was seen today for physical therapy evaluation and treatment s/p R TKA on 10/26/23. Patient ambulating with step through pattern using RW, requires assist to bring LLE onto bed.  Mild ROM restrictions in extension, 85d flexion following stretch.  Pain levels controlled with meds and ice compression, able to generate a fair quad set.   OBJECTIVE IMPAIRMENTS: Abnormal gait, decreased activity tolerance, decreased endurance, decreased mobility, difficulty walking, decreased ROM, decreased strength, and pain.   ACTIVITY LIMITATIONS: carrying, lifting, sitting, standing, squatting, stairs, and transfers  PERSONAL FACTORS: Age, Fitness, and Past/current experiences are also affecting patient's functional outcome.   REHAB POTENTIAL: Good  CLINICAL DECISION MAKING: Stable/uncomplicated  EVALUATION COMPLEXITY: Low   GOALS: Goals reviewed with patient? No  SHORT TERM GOALS: Target date: 11/26/2023   Patient to demonstrate independence in HEP  Baseline: [email or web address] Goal status: INITIAL  2.  Patient to negotiate 8 stairs with most appropriate pattern Baseline: TBD Goal status: INITIAL   LONG TERM GOALS: Target date: 12/24/2023  Patient will acknowledge 4/10 pain at least once during episode of care   Baseline: 8 Goal status: INITIAL  2.  Patient will increase 30s chair stand reps from 0 to 6 with/without arms to demonstrate and improved functional ability with less pain/difficulty as well as reduce fall risk.  Baseline: 0 Goal status: INITIAL  3.  Patient will score at least 6/80 on FOTO to signify clinically meaningful improvement in functional abilities.   Baseline: 40/80 Goal status: INITIAL  4.  Increase knee ROM to 0d extension, 115d flexion Baseline:  A/PROM Right eval Left eval  Hip flexion    Hip extension    Hip abduction    Hip adduction    Hip  internal rotation    Hip external rotation    Knee flexion  70/85d  Knee extension  /-3d   Goal status: INITIAL  5.  Increase L knee strength to 4/5 Baseline:  MMT Right eval Left eval  Hip flexion    Hip extension    Hip abduction    Hip adduction    Hip internal rotation    Hip external rotation    Knee flexion  3  Knee extension  3-   Goal status: INITIAL  6.  Re-assess 2 MWT to determine progress towards functional mobility Baseline:  Goal status: INITIAL   PLAN:  PT FREQUENCY: 2x/week  PT DURATION: 8 weeks  PLANNED INTERVENTIONS: 97110-Therapeutic exercises, 97530- Therapeutic activity, 97112- Neuromuscular re-education, 97535- Self Care, 86578- Manual therapy, [phone]- Gait training, Patient/Family education, Balance training, and Stair training  PLAN FOR NEXT SESSION: HEP review and update, manual techniques as appropriate, aerobic tasks, ROM and flexibility activities, strengthening and PREs, TPDN, gait and balance training as needed    For all possible CPT codes, reference the Planned Interventions line above.     Check all conditions that are expected to impact treatment: {Conditions expected to impact treatment:Diabetes mellitus, Musculoskeletal disorders, and Complications related to surgery   If treatment provided at initial evaluation, no treatment charged due to lack of authorization.       Belen Pesch M Talayia Hjort, PT 10/30/2023, 12:13 PM

## 2023-10-30 ENCOUNTER — Ambulatory Visit: Attending: Physician Assistant

## 2023-10-30 ENCOUNTER — Other Ambulatory Visit: Payer: Self-pay

## 2023-10-30 DIAGNOSIS — Z96651 Presence of right artificial knee joint: Secondary | ICD-10-CM | POA: Diagnosis not present

## 2023-10-30 DIAGNOSIS — Z96652 Presence of left artificial knee joint: Secondary | ICD-10-CM | POA: Insufficient documentation

## 2023-10-30 DIAGNOSIS — M25561 Pain in right knee: Secondary | ICD-10-CM | POA: Diagnosis not present

## 2023-10-30 DIAGNOSIS — R2689 Other abnormalities of gait and mobility: Secondary | ICD-10-CM | POA: Diagnosis not present

## 2023-11-02 NOTE — Therapy (Deleted)
 OUTPATIENT PHYSICAL THERAPY LOWER EXTREMITY EVALUATION   Patient Name: Justin Burnett MRN: [id number] DOB:[birth year], 58 y.o., male Today's Date: 11/02/2023  END OF SESSION:    Past Medical History:  Diagnosis Date   Abnormal EKG    Chronic bilateral low back pain without sciatica 03/13/2022   Essential (primary) hypertension 09/26/2021   Hyperlipidemia 10/17/2021   Hypertension    Nerve damage    Neuropathy    Osteoarthritis 11/03/2011   Osteoarthritis of knees, bilateral 06/13/2022   Paresthesia 03/13/2022   Prediabetes 10/17/2021   Past Surgical History:  Procedure Laterality Date   FINGER SURGERY Left    As a child. Tip of middle finger cut off in wood shop   TOTAL KNEE ARTHROPLASTY Left 10/26/2023   Procedure: ARTHROPLASTY, KNEE, TOTAL;  Surgeon: Wes Hamman, MD;  Location: [facility] OR;  Service: Orthopedics;  Laterality: Left;   Patient Active Problem List   Diagnosis Date Noted   Status post total left knee replacement 10/26/2023   Preop cardiovascular exam 08/11/2023   Obesity (BMI 30.0-34.9) 08/11/2023   Cardiac murmur 08/11/2023   Cigarette smoker 08/11/2023   Prominent abdominal aortic pulsation 08/11/2023   Hypertension    Nerve damage    Neuromuscular disorder (HCC)    Abnormal EKG    Osteoarthritis of knees, bilateral 06/13/2022   Paresthesia 03/13/2022   Chronic bilateral low back pain without sciatica 03/13/2022   Hyperlipidemia 10/17/2021   Prediabetes 10/17/2021   Essential (primary) hypertension 09/26/2021   Neuropathy    Osteoarthritis 11/03/2011    PCP: [name], NP   REFERRING PROVIDER: Sandie Cross, PA-C  REFERRING DIAG: [phone] (ICD-10-CM) - Hx of total knee replacement, left  THERAPY DIAG:  No diagnosis found.  Rationale for Evaluation and Treatment: Rehabilitation  ONSET DATE: 10/26/23 DOS  SUBJECTIVE:   SUBJECTIVE STATEMENT: Patient relates a year plus history L knee pain.  Underwent L TKA on 10/26/23  PERTINENT  HISTORY: Wanya is a very pleasant 58 year old gentleman with end-stage varus DJD of both knees. Treatment options were discussed again. We agreed to do a right knee cortisone injection with plans to do a left total knee arthroplasty in the near future. He will call [name] to schedule surgery. Risk benefits prognosis reviewed.  PAIN:  Are you having pain? Yes: NPRS scale: 4-8/10 Pain location: L knee Pain description: ache, sore, throb Aggravating factors: activity Relieving factors: rest ice  PRECAUTIONS: Fall  RED FLAGS: None   WEIGHT BEARING RESTRICTIONS: No  FALLS:  Has patient fallen in last 6 months? No  OCCUPATION: not working  PLOF: Independent  PATIENT GOALS: To get my knee working again  NEXT MD VISIT: 2 weeks  OBJECTIVE:  Note: Objective measures were completed at Evaluation unless otherwise noted.  DIAGNOSTIC FINDINGS: none avaialble  PATIENT SURVEYS:  LEFS 6/80  MUSCLE LENGTH: Not tested  POSTURE: R knee varus  PALPATION: deferred  LOWER EXTREMITY ROM:  A/PROM Right eval Left eval  Hip flexion    Hip extension    Hip abduction    Hip adduction    Hip internal rotation    Hip external rotation    Knee flexion  70/85d  Knee extension  /-3d  Ankle dorsiflexion    Ankle plantarflexion    Ankle inversion    Ankle eversion     (Blank rows = not tested)  LOWER EXTREMITY MMT:  MMT Right eval Left eval  Hip flexion    Hip extension    Hip abduction  Hip adduction    Hip internal rotation    Hip external rotation    Knee flexion  3  Knee extension  3-  Ankle dorsiflexion    Ankle plantarflexion    Ankle inversion    Ankle eversion     (Blank rows = not tested)  LOWER EXTREMITY SPECIAL TESTS:  deferred  FUNCTIONAL TESTS:  30 seconds chair stand test 0 with RW 2 MWT 157ft  GAIT: Distance walked: 75ft x2 Assistive device utilized: Walker - 2 wheeled Level of assistance: Complete Independence Comments: antalgic gait                                                                                                                                 TREATMENT DATE:  [facility] Adult PT Treatment:                                                DATE: 10/30/23 Eval and HEP Self Care: Additional minutes spent for educating on updated Therapeutic Home Exercise Program as well as comparing current status to condition at start of symptoms. This included exercises focusing on stretching, strengthening, with focus on eccentric aspects. Long term goals include an improvement in range of motion, strength, endurance as well as avoiding reinjury. Patient's frequency would include in 1-2 times a day, 3-5 times a week for a duration of 6-12 weeks. Proper technique shown and discussed handout in great detail. All questions were discussed and addressed.      PATIENT EDUCATION:  Education details: Discussed eval findings, rehab rationale and POC and patient is in agreement  Person educated: Patient Education method: Explanation Education comprehension: verbalized understanding and needs further education  HOME EXERCISE PROGRAM: Access Code: [email or web address] URL: [email or web address] Date: 10/30/2023 Prepared by: Gretta Leavens  Exercises - Supine Quad Set  - 5 x daily - 5 x weekly - 1 sets - 10 reps - 3s hold - Supine Heel Slide with Strap  - 5 x daily - 5 x weekly - 1 sets - 10 reps - Seated Heel Slide  - 5 x daily - 5 x weekly - 1 sets - 10 reps - 3s hold - Heel Toe Raises with Counter Support  - 5 x daily - 5 x weekly - 1 sets - 10 reps  ASSESSMENT:  CLINICAL IMPRESSION: Patient is a 58 y.o. male who was seen today for physical therapy evaluation and treatment s/p R TKA on 10/26/23. Patient ambulating with step through pattern using RW, requires assist to bring LLE onto bed.  Mild ROM restrictions in extension, 85d flexion following stretch.  Pain levels controlled with meds and ice compression, able to generate a fair quad set.    OBJECTIVE IMPAIRMENTS: Abnormal gait, decreased activity tolerance, decreased endurance, decreased mobility, difficulty walking, decreased ROM, decreased strength, and  pain.   ACTIVITY LIMITATIONS: carrying, lifting, sitting, standing, squatting, stairs, and transfers  PERSONAL FACTORS: Age, Fitness, and Past/current experiences are also affecting patient's functional outcome.   REHAB POTENTIAL: Good  CLINICAL DECISION MAKING: Stable/uncomplicated  EVALUATION COMPLEXITY: Low   GOALS: Goals reviewed with patient? No  SHORT TERM GOALS: Target date: 11/26/2023   Patient to demonstrate independence in HEP  Baseline: [email or web address] Goal status: INITIAL  2.  Patient to negotiate 8 stairs with most appropriate pattern Baseline: TBD Goal status: INITIAL   LONG TERM GOALS: Target date: 12/24/2023  Patient will acknowledge 4/10 pain at least once during episode of care   Baseline: 8 Goal status: INITIAL  2.  Patient will increase 30s chair stand reps from 0 to 6 with/without arms to demonstrate and improved functional ability with less pain/difficulty as well as reduce fall risk.  Baseline: 0 Goal status: INITIAL  3.  Patient will score at least 6/80 on FOTO to signify clinically meaningful improvement in functional abilities.   Baseline: 40/80 Goal status: INITIAL  4.  Increase knee ROM to 0d extension, 115d flexion Baseline:  A/PROM Right eval Left eval  Hip flexion    Hip extension    Hip abduction    Hip adduction    Hip internal rotation    Hip external rotation    Knee flexion  70/85d  Knee extension  /-3d   Goal status: INITIAL  5.  Increase L knee strength to 4/5 Baseline:  MMT Right eval Left eval  Hip flexion    Hip extension    Hip abduction    Hip adduction    Hip internal rotation    Hip external rotation    Knee flexion  3  Knee extension  3-   Goal status: INITIAL  6.  Re-assess 2 MWT to determine progress towards functional  mobility Baseline:  Goal status: INITIAL   PLAN:  PT FREQUENCY: 2x/week  PT DURATION: 8 weeks  PLANNED INTERVENTIONS: 97110-Therapeutic exercises, 97530- Therapeutic activity, 97112- Neuromuscular re-education, 97535- Self Care, [id number]- Manual therapy, [phone]- Gait training, Patient/Family education, Balance training, and Stair training  PLAN FOR NEXT SESSION: HEP review and update, manual techniques as appropriate, aerobic tasks, ROM and flexibility activities, strengthening and PREs, TPDN, gait and balance training as needed    For all possible CPT codes, reference the Planned Interventions line above.     Check all conditions that are expected to impact treatment: {Conditions expected to impact treatment:Diabetes mellitus, Musculoskeletal disorders, and Complications related to surgery   If treatment provided at initial evaluation, no treatment charged due to lack of authorization.       Isador Castille M Tyrrell Stephens, PT 11/02/2023, 8:53 AM

## 2023-11-03 ENCOUNTER — Other Ambulatory Visit: Payer: Self-pay | Admitting: Physician Assistant

## 2023-11-03 ENCOUNTER — Ambulatory Visit: Admitting: Physical Therapy

## 2023-11-03 ENCOUNTER — Encounter: Payer: Self-pay | Admitting: Physical Therapy

## 2023-11-03 ENCOUNTER — Other Ambulatory Visit (HOSPITAL_COMMUNITY): Payer: Self-pay

## 2023-11-03 ENCOUNTER — Telehealth: Payer: Self-pay

## 2023-11-03 DIAGNOSIS — Z96651 Presence of right artificial knee joint: Secondary | ICD-10-CM | POA: Diagnosis not present

## 2023-11-03 DIAGNOSIS — M25561 Pain in right knee: Secondary | ICD-10-CM

## 2023-11-03 DIAGNOSIS — Z96652 Presence of left artificial knee joint: Secondary | ICD-10-CM | POA: Diagnosis not present

## 2023-11-03 DIAGNOSIS — R2689 Other abnormalities of gait and mobility: Secondary | ICD-10-CM

## 2023-11-03 MED ORDER — OXYCODONE-ACETAMINOPHEN 5-325 MG PO TABS
1.0000 | ORAL_TABLET | Freq: Four times a day (QID) | ORAL | 0 refills | Status: DC | PRN
  Filled 2023-11-03: qty 40, 5d supply, fill #0

## 2023-11-03 NOTE — Telephone Encounter (Signed)
 Patient calling back again stating he still has the rash and me be getting bigger, you do not have any opening today; but I would assume he would want to see patient? Also needs refill of pain medication

## 2023-11-03 NOTE — Telephone Encounter (Signed)
 Can Justin Burnett or Justin Burnett see him?

## 2023-11-03 NOTE — Therapy (Signed)
 OUTPATIENT PHYSICAL THERAPY LOWER EXTREMITY EVALUATION   Patient Name: Justin Burnett MRN: [id number] DOB:[birth year], 58 y.o., male Today's Date: 11/03/2023  END OF SESSION:  PT End of Session - 11/03/23 1359     Visit Number 2    Number of Visits 16    Date for PT Re-Evaluation 12/30/23    Authorization Type MCD    PT Start Time 1400    PT Stop Time 1440    PT Time Calculation (min) 40 min    Equipment Utilized During Treatment Gait belt    Activity Tolerance Patient tolerated treatment well    Behavior During Therapy [facility] for tasks assessed/performed             Past Medical History:  Diagnosis Date   Abnormal EKG    Chronic bilateral low back pain without sciatica 03/13/2022   Essential (primary) hypertension 09/26/2021   Hyperlipidemia 10/17/2021   Hypertension    Nerve damage    Neuropathy    Osteoarthritis 11/03/2011   Osteoarthritis of knees, bilateral 06/13/2022   Paresthesia 03/13/2022   Prediabetes 10/17/2021   Past Surgical History:  Procedure Laterality Date   FINGER SURGERY Left    As a child. Tip of middle finger cut off in wood shop   TOTAL KNEE ARTHROPLASTY Left 10/26/2023   Procedure: ARTHROPLASTY, KNEE, TOTAL;  Surgeon: Wes Hamman, MD;  Location: [facility] OR;  Service: Orthopedics;  Laterality: Left;   Patient Active Problem List   Diagnosis Date Noted   Status post total left knee replacement 10/26/2023   Preop cardiovascular exam 08/11/2023   Obesity (BMI 30.0-34.9) 08/11/2023   Cardiac murmur 08/11/2023   Cigarette smoker 08/11/2023   Prominent abdominal aortic pulsation 08/11/2023   Hypertension    Nerve damage    Neuromuscular disorder (HCC)    Abnormal EKG    Osteoarthritis of knees, bilateral 06/13/2022   Paresthesia 03/13/2022   Chronic bilateral low back pain without sciatica 03/13/2022   Hyperlipidemia 10/17/2021   Prediabetes 10/17/2021   Essential (primary) hypertension 09/26/2021   Neuropathy    Osteoarthritis 11/03/2011     PCP: [name], NP   REFERRING PROVIDER: Sandie Cross, PA-C  REFERRING DIAG: [phone] (ICD-10-CM) - Hx of total knee replacement, left  THERAPY DIAG:  Acute pain of right knee  Other abnormalities of gait and mobility  History of total right knee replacement  Rationale for Evaluation and Treatment: Rehabilitation  ONSET DATE: 10/26/23 DOS  SUBJECTIVE:   SUBJECTIVE STATEMENT: Pt attended today's session with reports of 7/10 pain. Pt stated that they have maintained good compliance with current HEP.  Swelling is a problem, feel more pain in the morning.   PERTINENT HISTORY: Justin Burnett is a very pleasant 58 year old gentleman with end-stage varus DJD of both knees. Treatment options were discussed again. We agreed to do a right knee cortisone injection with plans to do a left total knee arthroplasty in the near future. He will call [name] to schedule surgery. Risk benefits prognosis reviewed.  PAIN:  Are you having pain? Yes: NPRS scale: 4-8/10 Pain location: L knee Pain description: ache, sore, throb Aggravating factors: activity Relieving factors: rest ice  PRECAUTIONS: Fall  RED FLAGS: None   WEIGHT BEARING RESTRICTIONS: No  FALLS:  Has patient fallen in last 6 months? No  OCCUPATION: not working  PLOF: Independent  PATIENT GOALS: To get my knee working again  NEXT MD VISIT: 2 weeks  OBJECTIVE:  Note: Objective measures were completed at Evaluation unless otherwise  noted.  DIAGNOSTIC FINDINGS: none avaialble  PATIENT SURVEYS:  LEFS 6/80  MUSCLE LENGTH: Not tested  POSTURE: R knee varus  PALPATION: deferred  LOWER EXTREMITY ROM:  A/PROM Right eval Left eval  Hip flexion    Hip extension    Hip abduction    Hip adduction    Hip internal rotation    Hip external rotation    Knee flexion  70/85d  Knee extension  /-3d  Ankle dorsiflexion    Ankle plantarflexion    Ankle inversion    Ankle eversion     (Blank rows = not  tested)  LOWER EXTREMITY MMT:  MMT Right eval Left eval  Hip flexion    Hip extension    Hip abduction    Hip adduction    Hip internal rotation    Hip external rotation    Knee flexion  3  Knee extension  3-  Ankle dorsiflexion    Ankle plantarflexion    Ankle inversion    Ankle eversion     (Blank rows = not tested)  LOWER EXTREMITY SPECIAL TESTS:  deferred  FUNCTIONAL TESTS:  30 seconds chair stand test 0 with RW 2 MWT 137ft  GAIT: Distance walked: 75ft x2 Assistive device utilized: Walker - 2 wheeled Level of assistance: Complete Independence Comments: antalgic gait                                                                                                                                TREATMENT : [facility] Adult PT Treatment:                                                DATE: 11/03/2023  Therapeutic Exercise: Rec. Bike 5' Partial ROM for swelling, ROM, and activity tolerance L knee PROM into flex ext w/ overpressure as tolerated Neuromuscular re-ed: Quad set 2x8, hold 8s Supine SLR 2x8, hold 5s Self Care: Taping education for swelling Education on desensitization techniques Education on bandaging and icing protocol   [facility] Adult PT Treatment:                                                DATE: 10/30/23 Eval and HEP Self Care: Additional minutes spent for educating on updated Therapeutic Home Exercise Program as well as comparing current status to condition at start of symptoms. This included exercises focusing on stretching, strengthening, with focus on eccentric aspects. Long term goals include an improvement in range of motion, strength, endurance as well as avoiding reinjury. Patient's frequency would include in 1-2 times a day, 3-5 times a week for a duration of 6-12 weeks. Proper technique shown and discussed handout in great  detail. All questions were discussed and addressed.      PATIENT EDUCATION:  Education details: Discussed eval findings,  rehab rationale and POC and patient is in agreement  Person educated: Patient Education method: Explanation Education comprehension: verbalized understanding and needs further education  HOME EXERCISE PROGRAM: Access Code: [email or web address] URL: [email or web address] Date: 10/30/2023 Prepared by: Gretta Leavens  Exercises - Supine Quad Set  - 5 x daily - 5 x weekly - 1 sets - 10 reps - 3s hold - Supine Heel Slide with Strap  - 5 x daily - 5 x weekly - 1 sets - 10 reps - Seated Heel Slide  - 5 x daily - 5 x weekly - 1 sets - 10 reps - 3s hold - Heel Toe Raises with Counter Support  - 5 x daily - 5 x weekly - 1 sets - 10 reps  ASSESSMENT:  CLINICAL IMPRESSION: Pt attended physical therapy session for continuation of treatment regarding L TKA. Today's treatment focused on improvement of  L knee A/PROM, swelling management, quadriceps activation and pt education surrounding ar home management of surgical rehab. Pts AROM was measured at 4-85, PROM was 0-90. Pt showed  great tolerance to administered treatment with no adverse effects by the end of session. Skilled intervention was utilized via activity modification for pt tolerance with task completion, functional progression/regression promoting best outcomes inline with current rehab goals, as well as minimal verbal/tactile cuing alongside no physical assistance for safe and appropriate performance of today's activities. Continue to progress as tolerated.   Eval Impression:Patient is a 58 y.o. male who was seen today for physical therapy evaluation and treatment s/p L TKA on 10/26/23. Patient ambulating with step through pattern using RW, requires assist to bring LLE onto bed.  Mild ROM restrictions in extension, 85d flexion following stretch.  Pain levels controlled with meds and ice compression, able to generate a fair quad set.   OBJECTIVE IMPAIRMENTS: Abnormal gait, decreased activity tolerance, decreased endurance, decreased mobility,  difficulty walking, decreased ROM, decreased strength, and pain.   ACTIVITY LIMITATIONS: carrying, lifting, sitting, standing, squatting, stairs, and transfers  PERSONAL FACTORS: Age, Fitness, and Past/current experiences are also affecting patient's functional outcome.   REHAB POTENTIAL: Good  CLINICAL DECISION MAKING: Stable/uncomplicated  EVALUATION COMPLEXITY: Low   GOALS: Goals reviewed with patient? No  SHORT TERM GOALS: Target date: 11/26/2023   Patient to demonstrate independence in HEP  Baseline: [email or web address] Goal status: INITIAL  2.  Patient to negotiate 8 stairs with most appropriate pattern Baseline: TBD Goal status: INITIAL   LONG TERM GOALS: Target date: 12/24/2023  Patient will acknowledge 4/10 pain at least once during episode of care   Baseline: 8 Goal status: INITIAL  2.  Patient will increase 30s chair stand reps from 0 to 6 with/without arms to demonstrate and improved functional ability with less pain/difficulty as well as reduce fall risk.  Baseline: 0 Goal status: INITIAL  3.  Patient will score at least 6/80 on FOTO to signify clinically meaningful improvement in functional abilities.   Baseline: 40/80 Goal status: INITIAL  4.  Increase knee ROM to 0d extension, 115d flexion Baseline:  A/PROM Right eval Left eval  Hip flexion    Hip extension    Hip abduction    Hip adduction    Hip internal rotation    Hip external rotation    Knee flexion  70/85d  Knee extension  /-3d   Goal status: INITIAL  5.  Increase L knee strength to 4/5  Baseline:  MMT Right eval Left eval  Hip flexion    Hip extension    Hip abduction    Hip adduction    Hip internal rotation    Hip external rotation    Knee flexion  3  Knee extension  3-   Goal status: INITIAL  6.  Re-assess 2 MWT to determine progress towards functional mobility Baseline:  Goal status: INITIAL   PLAN:  PT FREQUENCY: 2x/week  PT DURATION: 8 weeks  PLANNED INTERVENTIONS:  97110-Therapeutic exercises, 97530- Therapeutic activity, 97112- Neuromuscular re-education, 97535- Self Care, 16109- Manual therapy, [phone]- Gait training, Patient/Family education, Balance training, and Stair training  PLAN FOR NEXT SESSION: HEP review and update, manual techniques as appropriate, aerobic tasks, ROM and flexibility activities, strengthening and PREs, TPDN, gait and balance training as needed    For all possible CPT codes, reference the Planned Interventions line above.     Check all conditions that are expected to impact treatment: {Conditions expected to impact treatment:Diabetes mellitus, Musculoskeletal disorders, and Complications related to surgery   If treatment provided at initial evaluation, no treatment charged due to lack of authorization.      Albesa Huguenin, PT, DPT 11/03/2023, 3:34 PM

## 2023-11-03 NOTE — Telephone Encounter (Signed)
Tried to call patient. No answer. LMOM for patient to call me back.

## 2023-11-03 NOTE — Telephone Encounter (Signed)
 They have no openings, want me to work him in this afternoon with you?

## 2023-11-04 ENCOUNTER — Other Ambulatory Visit (HOSPITAL_COMMUNITY): Payer: Self-pay

## 2023-11-04 ENCOUNTER — Ambulatory Visit: Payer: Self-pay

## 2023-11-05 ENCOUNTER — Ambulatory Visit: Admitting: Physical Therapy

## 2023-11-05 ENCOUNTER — Encounter: Payer: Self-pay | Admitting: Physical Therapy

## 2023-11-05 DIAGNOSIS — R2689 Other abnormalities of gait and mobility: Secondary | ICD-10-CM

## 2023-11-05 DIAGNOSIS — Z96652 Presence of left artificial knee joint: Secondary | ICD-10-CM | POA: Diagnosis not present

## 2023-11-05 DIAGNOSIS — M25561 Pain in right knee: Secondary | ICD-10-CM | POA: Diagnosis not present

## 2023-11-05 DIAGNOSIS — Z96651 Presence of right artificial knee joint: Secondary | ICD-10-CM | POA: Diagnosis not present

## 2023-11-05 NOTE — Therapy (Signed)
 OUTPATIENT PHYSICAL THERAPY LOWER EXTREMITY EVALUATION   Patient Name: Justin Burnett MRN: [id number] DOB:07-17-1965, 58 y.o., male Today's Date: 11/05/2023  END OF SESSION:  PT End of Session - 11/05/23 1730     Visit Number 3    Number of Visits 16    Date for PT Re-Evaluation 12/30/23    Authorization Type MCD    PT Start Time 1516    PT Stop Time 1539    PT Time Calculation (min) 23 min              Past Medical History:  Diagnosis Date   Abnormal EKG    Chronic bilateral low back pain without sciatica 03/13/2022   Essential (primary) hypertension 09/26/2021   Hyperlipidemia 10/17/2021   Hypertension    Nerve damage    Neuropathy    Osteoarthritis 11/03/2011   Osteoarthritis of knees, bilateral 06/13/2022   Paresthesia 03/13/2022   Prediabetes 10/17/2021   Past Surgical History:  Procedure Laterality Date   FINGER SURGERY Left    As a child. Tip of middle finger cut off in wood shop   TOTAL KNEE ARTHROPLASTY Left 10/26/2023   Procedure: ARTHROPLASTY, KNEE, TOTAL;  Surgeon: Wes Hamman, MD;  Location: [facility] OR;  Service: Orthopedics;  Laterality: Left;   Patient Active Problem List   Diagnosis Date Noted   Status post total left knee replacement 10/26/2023   Preop cardiovascular exam 08/11/2023   Obesity (BMI 30.0-34.9) 08/11/2023   Cardiac murmur 08/11/2023   Cigarette smoker 08/11/2023   Prominent abdominal aortic pulsation 08/11/2023   Hypertension    Nerve damage    Neuromuscular disorder (HCC)    Abnormal EKG    Osteoarthritis of knees, bilateral 06/13/2022   Paresthesia 03/13/2022   Chronic bilateral low back pain without sciatica 03/13/2022   Hyperlipidemia 10/17/2021   Prediabetes 10/17/2021   Essential (primary) hypertension 09/26/2021   Neuropathy    Osteoarthritis 11/03/2011    PCP: [name], NP   REFERRING PROVIDER: Sandie Cross, PA-C  REFERRING DIAG: [phone] (ICD-10-CM) - Hx of total knee replacement, left  THERAPY  DIAG:  Acute pain of right knee  Other abnormalities of gait and mobility  History of total right knee replacement  Rationale for Evaluation and Treatment: Rehabilitation  ONSET DATE: 10/26/23 DOS  SUBJECTIVE:   SUBJECTIVE STATEMENT: Patient relates a year plus history L knee pain.  Underwent L TKA on 10/26/23  PERTINENT HISTORY: Gared is a very pleasant 58 year old gentleman with end-stage varus DJD of both knees. Treatment options were discussed again. We agreed to do a right knee cortisone injection with plans to do a left total knee arthroplasty in the near future. He will call [name] to schedule surgery. Risk benefits prognosis reviewed.  PAIN:  Are you having pain? Yes: NPRS scale: 4-8/10 Pain location: L knee Pain description: ache, sore, throb Aggravating factors: activity Relieving factors: rest ice  PRECAUTIONS: Fall  RED FLAGS: None   WEIGHT BEARING RESTRICTIONS: No  FALLS:  Has patient fallen in last 6 months? No  OCCUPATION: not working  PLOF: Independent  PATIENT GOALS: To get my knee working again  NEXT MD VISIT: 2 weeks  OBJECTIVE:  Note: Objective measures were completed at Evaluation unless otherwise noted.  DIAGNOSTIC FINDINGS: none avaialble  PATIENT SURVEYS:  LEFS 6/80  MUSCLE LENGTH: Not tested  POSTURE: R knee varus  PALPATION: deferred  LOWER EXTREMITY ROM:  A/PROM Right eval Left eval  Hip flexion    Hip extension  Hip abduction    Hip adduction    Hip internal rotation    Hip external rotation    Knee flexion  70/85d  Knee extension  /-3d  Ankle dorsiflexion    Ankle plantarflexion    Ankle inversion    Ankle eversion     (Blank rows = not tested)  LOWER EXTREMITY MMT:  MMT Right eval Left eval  Hip flexion    Hip extension    Hip abduction    Hip adduction    Hip internal rotation    Hip external rotation    Knee flexion  3  Knee extension  3-  Ankle dorsiflexion    Ankle plantarflexion    Ankle  inversion    Ankle eversion     (Blank rows = not tested)  LOWER EXTREMITY SPECIAL TESTS:  deferred  FUNCTIONAL TESTS:  30 seconds chair stand test 0 with RW 2 MWT 13ft  GAIT: Distance walked: 76ft x2 Assistive device utilized: Environmental consultant - 2 wheeled Level of assistance: Complete Independence Comments: antalgic gait                                                                                                                                TREATMENT DATE:  [facility] Adult PT Treatment:   (74m late)                   DATE: 11/05/2023 Therapeutic Exercise: PROM into L knee flexion/ext (0-90) Neuromuscular re-ed: Quad set 2x8, hold 8s Supine SLR 2x8, hold 3s   [facility] Adult PT Treatment:                                                DATE: 10/30/23 Eval and HEP Self Care: Additional minutes spent for educating on updated Therapeutic Home Exercise Program as well as comparing current status to condition at start of symptoms. This included exercises focusing on stretching, strengthening, with focus on eccentric aspects. Long term goals include an improvement in range of motion, strength, endurance as well as avoiding reinjury. Patient's frequency would include in 1-2 times a day, 3-5 times a week for a duration of 6-12 weeks. Proper technique shown and discussed handout in great detail. All questions were discussed and addressed.      PATIENT EDUCATION:  Education details: Discussed eval findings, rehab rationale and POC and patient is in agreement  Person educated: Patient Education method: Explanation Education comprehension: verbalized understanding and needs further education  HOME EXERCISE PROGRAM: Access Code: [email or web address] URL: [email or web address] Date: 10/30/2023 Prepared by: Gretta Leavens  Exercises - Supine Quad Set  - 5 x daily - 5 x weekly - 1 sets - 10 reps - 3s hold - Supine Heel Slide with Strap  - 5 x daily - 5 x weekly - 1 sets -  10 reps - Seated Heel  Slide  - 5 x daily - 5 x weekly - 1 sets - 10 reps - 3s hold - Heel Toe Raises with Counter Support  - 5 x daily - 5 x weekly - 1 sets - 10 reps  ASSESSMENT:  CLINICAL IMPRESSION: Pt attended physical therapy session for continuation of treatment regarding L TKA. Today's treatment focused on improvement of  L knee ROM, and quad activation. Treatment was limited d/t 44m late arrival and associated time constraints. Pt showed great tolerance to administered treatment with no adverse effects by the end of session. Skilled intervention was utilized via activity modification for pt tolerance with task completion, functional progression/regression promoting best outcomes inline with current rehab goals, as well as minimal verbal/tactile cuing alongside no physical assistance for safe and appropriate performance of today's activities. Continue to progress as     Eval impression: Patient is a 58 y.o. male who was seen today for physical therapy evaluation and treatment s/p R TKA on 10/26/23. Patient ambulating with step through pattern using RW, requires assist to bring LLE onto bed.  Mild ROM restrictions in extension, 85d flexion following stretch.  Pain levels controlled with meds and ice compression, able to generate a fair quad set.   OBJECTIVE IMPAIRMENTS: Abnormal gait, decreased activity tolerance, decreased endurance, decreased mobility, difficulty walking, decreased ROM, decreased strength, and pain.   ACTIVITY LIMITATIONS: carrying, lifting, sitting, standing, squatting, stairs, and transfers  PERSONAL FACTORS: Age, Fitness, and Past/current experiences are also affecting patient's functional outcome.   REHAB POTENTIAL: Good  CLINICAL DECISION MAKING: Stable/uncomplicated  EVALUATION COMPLEXITY: Low   GOALS: Goals reviewed with patient? No  SHORT TERM GOALS: Target date: 11/26/2023   Patient to demonstrate independence in HEP  Baseline: [email or web address] Goal status: INITIAL  2.  Patient to  negotiate 8 stairs with most appropriate pattern Baseline: TBD Goal status: INITIAL   LONG TERM GOALS: Target date: 12/24/2023  Patient will acknowledge 4/10 pain at least once during episode of care   Baseline: 8 Goal status: INITIAL  2.  Patient will increase 30s chair stand reps from 0 to 6 with/without arms to demonstrate and improved functional ability with less pain/difficulty as well as reduce fall risk.  Baseline: 0 Goal status: INITIAL  3.  Patient will score at least 6/80 on FOTO to signify clinically meaningful improvement in functional abilities.   Baseline: 40/80 Goal status: INITIAL  4.  Increase knee ROM to 0d extension, 115d flexion Baseline:  A/PROM Right eval Left eval  Hip flexion    Hip extension    Hip abduction    Hip adduction    Hip internal rotation    Hip external rotation    Knee flexion  70/85d  Knee extension  /-3d   Goal status: INITIAL  5.  Increase L knee strength to 4/5 Baseline:  MMT Right eval Left eval  Hip flexion    Hip extension    Hip abduction    Hip adduction    Hip internal rotation    Hip external rotation    Knee flexion  3  Knee extension  3-   Goal status: INITIAL  6.  Re-assess 2 MWT to determine progress towards functional mobility Baseline:  Goal status: INITIAL   PLAN:  PT FREQUENCY: 2x/week  PT DURATION: 8 weeks  PLANNED INTERVENTIONS: 97110-Therapeutic exercises, 97530- Therapeutic activity, [id number]- Neuromuscular re-education, 97535- Self Care, [id number]- Manual therapy, [phone]- Gait training, Patient/Family education, Balance training, and Stair training  PLAN FOR NEXT SESSION: HEP review and update, manual techniques as appropriate, aerobic tasks, ROM and flexibility activities, strengthening and PREs, TPDN, gait and balance training as needed    For all possible CPT codes, reference the Planned Interventions line above.     Check all conditions that are expected to impact treatment: {Conditions  expected to impact treatment:Diabetes mellitus, Musculoskeletal disorders, and Complications related to surgery   If treatment provided at initial evaluation, no treatment charged due to lack of authorization.       Albesa Huguenin, PT, DPT 11/05/2023, 5:42 PM

## 2023-11-09 ENCOUNTER — Telehealth: Payer: Self-pay | Admitting: Gastroenterology

## 2023-11-09 NOTE — Telephone Encounter (Signed)
 Patient's wife called today asking about prep instructions (when to start, how to do it, etc).  Went over the prep with them.  While doing so and after further looking into his chart, it appears patient had knee replacement 10/26/2023 and at that time he was placed on Eliquis .  He has been on Eliquis  since and his last dose was this morning.  At the time of the previsit 10/19/2023 he was not on any blood thinners but I did confirm with both him and his wife that he is currently on Eliquis  with his last dose this morning.  In the setting of being on Eliquis  secondary to recent knee surgery, discussed his procedure tomorrow will have to be canceled as we cannot do a colonoscopy while on the blood thinner safely in order to remove polyp's.  FYI to Dr. Elvin Hammer.   Dottie/Linda/Natalie, please have patient come in for appt for previsit to reschedule his colonoscopy. Appears he will only have to be on Eliquis  for 30 days post surgery so unsure if a nurse visit will be enough for reschedule or if he will have to see a provider.  Thanks!

## 2023-11-10 ENCOUNTER — Encounter: Admitting: Physician Assistant

## 2023-11-10 ENCOUNTER — Encounter: Admitting: Internal Medicine

## 2023-11-10 NOTE — Telephone Encounter (Signed)
 Patient is returning a call to further discuss rescheduling his procedure since being put on Eliquis . Patient is requesting a call back. Please advise.

## 2023-11-10 NOTE — Telephone Encounter (Signed)
 PT is returning call to further discuss rescheduling procedure since being put on Eliquis 

## 2023-11-10 NOTE — Telephone Encounter (Signed)
 Left message for patient to call back

## 2023-11-10 NOTE — Therapy (Deleted)
 OUTPATIENT PHYSICAL THERAPY LOWER EXTREMITY EVALUATION   Patient Name: Justin Burnett MRN: [id number] DOB:May 17, 1966, 58 y.o., male Today's Date: 11/10/2023  END OF SESSION:     Past Medical History:  Diagnosis Date   Abnormal EKG    Chronic bilateral low back pain without sciatica 03/13/2022   Essential (primary) hypertension 09/26/2021   Hyperlipidemia 10/17/2021   Hypertension    Nerve damage    Neuropathy    Osteoarthritis 11/03/2011   Osteoarthritis of knees, bilateral 06/13/2022   Paresthesia 03/13/2022   Prediabetes 10/17/2021   Past Surgical History:  Procedure Laterality Date   FINGER SURGERY Left    As a child. Tip of middle finger cut off in wood shop   TOTAL KNEE ARTHROPLASTY Left 10/26/2023   Procedure: ARTHROPLASTY, KNEE, TOTAL;  Surgeon: Wes Hamman, MD;  Location: [facility] OR;  Service: Orthopedics;  Laterality: Left;   Patient Active Problem List   Diagnosis Date Noted   Status post total left knee replacement 10/26/2023   Preop cardiovascular exam 08/11/2023   Obesity (BMI 30.0-34.9) 08/11/2023   Cardiac murmur 08/11/2023   Cigarette smoker 08/11/2023   Prominent abdominal aortic pulsation 08/11/2023   Hypertension    Nerve damage    Neuromuscular disorder (HCC)    Abnormal EKG    Osteoarthritis of knees, bilateral 06/13/2022   Paresthesia 03/13/2022   Chronic bilateral low back pain without sciatica 03/13/2022   Hyperlipidemia 10/17/2021   Prediabetes 10/17/2021   Essential (primary) hypertension 09/26/2021   Neuropathy    Osteoarthritis 11/03/2011    PCP: [name], NP   REFERRING PROVIDER: Sandie Cross, PA-C  REFERRING DIAG: [phone] (ICD-10-CM) - Hx of total knee replacement, left  THERAPY DIAG:  No diagnosis found.  Rationale for Evaluation and Treatment: Rehabilitation  ONSET DATE: 10/26/23 DOS  SUBJECTIVE:   SUBJECTIVE STATEMENT: Patient relates a year plus history L knee pain.  Underwent L TKA on 10/26/23  PERTINENT  HISTORY: Isaiahs is a very pleasant 58 year old gentleman with end-stage varus DJD of both knees. Treatment options were discussed again. We agreed to do a right knee cortisone injection with plans to do a left total knee arthroplasty in the near future. He will call [name] to schedule surgery. Risk benefits prognosis reviewed.  PAIN:  Are you having pain? Yes: NPRS scale: 4-8/10 Pain location: L knee Pain description: ache, sore, throb Aggravating factors: activity Relieving factors: rest ice  PRECAUTIONS: Fall  RED FLAGS: None   WEIGHT BEARING RESTRICTIONS: No  FALLS:  Has patient fallen in last 6 months? No  OCCUPATION: not working  PLOF: Independent  PATIENT GOALS: To get my knee working again  NEXT MD VISIT: 2 weeks  OBJECTIVE:  Note: Objective measures were completed at Evaluation unless otherwise noted.  DIAGNOSTIC FINDINGS: none avaialble  PATIENT SURVEYS:  LEFS 6/80  MUSCLE LENGTH: Not tested  POSTURE: R knee varus  PALPATION: deferred  LOWER EXTREMITY ROM:  A/PROM Right eval Left eval  Hip flexion    Hip extension    Hip abduction    Hip adduction    Hip internal rotation    Hip external rotation    Knee flexion  70/85d  Knee extension  /-3d  Ankle dorsiflexion    Ankle plantarflexion    Ankle inversion    Ankle eversion     (Blank rows = not tested)  LOWER EXTREMITY MMT:  MMT Right eval Left eval  Hip flexion    Hip extension    Hip abduction  Hip adduction    Hip internal rotation    Hip external rotation    Knee flexion  3  Knee extension  3-  Ankle dorsiflexion    Ankle plantarflexion    Ankle inversion    Ankle eversion     (Blank rows = not tested)  LOWER EXTREMITY SPECIAL TESTS:  deferred  FUNCTIONAL TESTS:  30 seconds chair stand test 0 with RW 2 MWT 181ft  GAIT: Distance walked: 28ft x2 Assistive device utilized: Environmental consultant - 2 wheeled Level of assistance: Complete Independence Comments: antalgic gait                                                                                                                                 TREATMENT DATE:  [facility] Adult PT Treatment:   (69m late)                   DATE: 11/05/2023 Therapeutic Exercise: PROM into L knee flexion/ext (0-90) Neuromuscular re-ed: Quad set 2x8, hold 8s Supine SLR 2x8, hold 3s   [facility] Adult PT Treatment:                                                DATE: 10/30/23 Eval and HEP Self Care: Additional minutes spent for educating on updated Therapeutic Home Exercise Program as well as comparing current status to condition at start of symptoms. This included exercises focusing on stretching, strengthening, with focus on eccentric aspects. Long term goals include an improvement in range of motion, strength, endurance as well as avoiding reinjury. Patient's frequency would include in 1-2 times a day, 3-5 times a week for a duration of 6-12 weeks. Proper technique shown and discussed handout in great detail. All questions were discussed and addressed.      PATIENT EDUCATION:  Education details: Discussed eval findings, rehab rationale and POC and patient is in agreement  Person educated: Patient Education method: Explanation Education comprehension: verbalized understanding and needs further education  HOME EXERCISE PROGRAM: Access Code: [email or web address] URL: [email or web address] Date: 10/30/2023 Prepared by: Gretta Leavens  Exercises - Supine Quad Set  - 5 x daily - 5 x weekly - 1 sets - 10 reps - 3s hold - Supine Heel Slide with Strap  - 5 x daily - 5 x weekly - 1 sets - 10 reps - Seated Heel Slide  - 5 x daily - 5 x weekly - 1 sets - 10 reps - 3s hold - Heel Toe Raises with Counter Support  - 5 x daily - 5 x weekly - 1 sets - 10 reps  ASSESSMENT:  CLINICAL IMPRESSION: Pt attended physical therapy session for continuation of treatment regarding L TKA. Today's treatment focused on improvement of  L knee ROM, and quad  activation. Treatment was limited d/t 56m late arrival  and associated time constraints. Pt showed great tolerance to administered treatment with no adverse effects by the end of session. Skilled intervention was utilized via activity modification for pt tolerance with task completion, functional progression/regression promoting best outcomes inline with current rehab goals, as well as minimal verbal/tactile cuing alongside no physical assistance for safe and appropriate performance of today's activities. Continue to progress as     Eval impression: Patient is a 58 y.o. male who was seen today for physical therapy evaluation and treatment s/p R TKA on 10/26/23. Patient ambulating with step through pattern using RW, requires assist to bring LLE onto bed.  Mild ROM restrictions in extension, 85d flexion following stretch.  Pain levels controlled with meds and ice compression, able to generate a fair quad set.   OBJECTIVE IMPAIRMENTS: Abnormal gait, decreased activity tolerance, decreased endurance, decreased mobility, difficulty walking, decreased ROM, decreased strength, and pain.   ACTIVITY LIMITATIONS: carrying, lifting, sitting, standing, squatting, stairs, and transfers  PERSONAL FACTORS: Age, Fitness, and Past/current experiences are also affecting patient's functional outcome.   REHAB POTENTIAL: Good  CLINICAL DECISION MAKING: Stable/uncomplicated  EVALUATION COMPLEXITY: Low   GOALS: Goals reviewed with patient? No  SHORT TERM GOALS: Target date: 11/26/2023   Patient to demonstrate independence in HEP  Baseline: [id number] Goal status: INITIAL  2.  Patient to negotiate 8 stairs with most appropriate pattern Baseline: TBD Goal status: INITIAL   LONG TERM GOALS: Target date: 12/24/2023  Patient will acknowledge 4/10 pain at least once during episode of care   Baseline: 8 Goal status: INITIAL  2.  Patient will increase 30s chair stand reps from 0 to 6 with/without arms to  demonstrate and improved functional ability with less pain/difficulty as well as reduce fall risk.  Baseline: 0 Goal status: INITIAL  3.  Patient will score at least 6/80 on FOTO to signify clinically meaningful improvement in functional abilities.   Baseline: 40/80 Goal status: INITIAL  4.  Increase knee ROM to 0d extension, 115d flexion Baseline:  A/PROM Right eval Left eval  Hip flexion    Hip extension    Hip abduction    Hip adduction    Hip internal rotation    Hip external rotation    Knee flexion  70/85d  Knee extension  /-3d   Goal status: INITIAL  5.  Increase L knee strength to 4/5 Baseline:  MMT Right eval Left eval  Hip flexion    Hip extension    Hip abduction    Hip adduction    Hip internal rotation    Hip external rotation    Knee flexion  3  Knee extension  3-   Goal status: INITIAL  6.  Re-assess 2 MWT to determine progress towards functional mobility Baseline:  Goal status: INITIAL   PLAN:  PT FREQUENCY: 2x/week  PT DURATION: 8 weeks  PLANNED INTERVENTIONS: 97110-Therapeutic exercises, 97530- Therapeutic activity, 97112- Neuromuscular re-education, 97535- Self Care, 95188- Manual therapy, [phone]- Gait training, Patient/Family education, Balance training, and Stair training  PLAN FOR NEXT SESSION: HEP review and update, manual techniques as appropriate, aerobic tasks, ROM and flexibility activities, strengthening and PREs, TPDN, gait and balance training as needed    For all possible CPT codes, reference the Planned Interventions line above.     Check all conditions that are expected to impact treatment: {Conditions expected to impact treatment:Diabetes mellitus, Musculoskeletal disorders, and Complications related to surgery   If treatment provided at initial evaluation, no treatment charged due to lack of  authorization.       Albesa Huguenin, PT, DPT 11/10/2023, 3:46 PM

## 2023-11-11 ENCOUNTER — Ambulatory Visit

## 2023-11-11 ENCOUNTER — Ambulatory Visit (INDEPENDENT_AMBULATORY_CARE_PROVIDER_SITE_OTHER): Admitting: Physician Assistant

## 2023-11-11 ENCOUNTER — Other Ambulatory Visit: Payer: Self-pay

## 2023-11-11 ENCOUNTER — Other Ambulatory Visit (HOSPITAL_COMMUNITY): Payer: Self-pay

## 2023-11-11 DIAGNOSIS — Z96652 Presence of left artificial knee joint: Secondary | ICD-10-CM

## 2023-11-11 DIAGNOSIS — M25561 Pain in right knee: Secondary | ICD-10-CM

## 2023-11-11 DIAGNOSIS — Z96651 Presence of right artificial knee joint: Secondary | ICD-10-CM | POA: Diagnosis not present

## 2023-11-11 DIAGNOSIS — R2689 Other abnormalities of gait and mobility: Secondary | ICD-10-CM

## 2023-11-11 MED ORDER — OXYCODONE-ACETAMINOPHEN 5-325 MG PO TABS
1.0000 | ORAL_TABLET | Freq: Three times a day (TID) | ORAL | 0 refills | Status: DC | PRN
  Filled 2023-11-11: qty 40, 7d supply, fill #0

## 2023-11-11 NOTE — Therapy (Signed)
 OUTPATIENT PHYSICAL THERAPY TREATMENT NOTE   Patient Name: Justin Burnett MRN: [id number] DOB:1965/09/14, 58 y.o., male Today's Date: 11/11/2023  END OF SESSION:  PT End of Session - 11/11/23 1455     Visit Number 4    Number of Visits 16    Date for PT Re-Evaluation 12/30/23    Authorization Type MCD    PT Start Time 1452    PT Stop Time 1530    PT Time Calculation (min) 38 min    Activity Tolerance Patient tolerated treatment well    Behavior During Therapy [facility] for tasks assessed/performed               Past Medical History:  Diagnosis Date   Abnormal EKG    Chronic bilateral low back pain without sciatica 03/13/2022   Essential (primary) hypertension 09/26/2021   Hyperlipidemia 10/17/2021   Hypertension    Nerve damage    Neuropathy    Osteoarthritis 11/03/2011   Osteoarthritis of knees, bilateral 06/13/2022   Paresthesia 03/13/2022   Prediabetes 10/17/2021   Past Surgical History:  Procedure Laterality Date   FINGER SURGERY Left    As a child. Tip of middle finger cut off in wood shop   TOTAL KNEE ARTHROPLASTY Left 10/26/2023   Procedure: ARTHROPLASTY, KNEE, TOTAL;  Surgeon: Wes Hamman, MD;  Location: [facility] OR;  Service: Orthopedics;  Laterality: Left;   Patient Active Problem List   Diagnosis Date Noted   Status post total left knee replacement 10/26/2023   Preop cardiovascular exam 08/11/2023   Obesity (BMI 30.0-34.9) 08/11/2023   Cardiac murmur 08/11/2023   Cigarette smoker 08/11/2023   Prominent abdominal aortic pulsation 08/11/2023   Hypertension    Nerve damage    Neuromuscular disorder (HCC)    Abnormal EKG    Osteoarthritis of knees, bilateral 06/13/2022   Paresthesia 03/13/2022   Chronic bilateral low back pain without sciatica 03/13/2022   Hyperlipidemia 10/17/2021   Prediabetes 10/17/2021   Essential (primary) hypertension 09/26/2021   Neuropathy    Osteoarthritis 11/03/2011    PCP: [name], NP   REFERRING PROVIDER:  Sandie Cross, PA-C  REFERRING DIAG: [phone] (ICD-10-CM) - Hx of total knee replacement, left  THERAPY DIAG:  Acute pain of right knee  Other abnormalities of gait and mobility  History of total right knee replacement  Rationale for Evaluation and Treatment: Rehabilitation  ONSET DATE: 10/26/23 DOS  SUBJECTIVE:   SUBJECTIVE STATEMENT: Saw MD earlier, sutures removed and given Rx for compression hose.  Pain levels still 10/10 at times  PERTINENT HISTORY: Issai is a very pleasant 58 year old gentleman with end-stage varus DJD of both knees. Treatment options were discussed again. We agreed to do a right knee cortisone injection with plans to do a left total knee arthroplasty in the near future. He will call [name] to schedule surgery. Risk benefits prognosis reviewed.  PAIN:  Are you having pain? Yes: NPRS scale: 4-8/10 Pain location: L knee Pain description: ache, sore, throb Aggravating factors: activity Relieving factors: rest ice  PRECAUTIONS: Fall  RED FLAGS: None   WEIGHT BEARING RESTRICTIONS: No  FALLS:  Has patient fallen in last 6 months? No  OCCUPATION: not working  PLOF: Independent  PATIENT GOALS: To get my knee working again  NEXT MD VISIT: 2 weeks  OBJECTIVE:  Note: Objective measures were completed at Evaluation unless otherwise noted.  DIAGNOSTIC FINDINGS: none avaialble  PATIENT SURVEYS:  LEFS 6/80  MUSCLE LENGTH: Not tested  POSTURE: R knee varus  PALPATION: deferred  LOWER EXTREMITY ROM:  A/PROM Right eval Left eval  Hip flexion    Hip extension    Hip abduction    Hip adduction    Hip internal rotation    Hip external rotation    Knee flexion  70/85d  Knee extension  /-3d  Ankle dorsiflexion    Ankle plantarflexion    Ankle inversion    Ankle eversion     (Blank rows = not tested)  LOWER EXTREMITY MMT:  MMT Right eval Left eval  Hip flexion    Hip extension    Hip abduction    Hip adduction    Hip internal  rotation    Hip external rotation    Knee flexion  3  Knee extension  3-  Ankle dorsiflexion    Ankle plantarflexion    Ankle inversion    Ankle eversion     (Blank rows = not tested)  LOWER EXTREMITY SPECIAL TESTS:  deferred  FUNCTIONAL TESTS:  30 seconds chair stand test 0 with RW 2 MWT 125ft  GAIT: Distance walked: 62ft x2 Assistive device utilized: Environmental consultant - 2 wheeled Level of assistance: Complete Independence Comments: antalgic gait                                                                                                                                TREATMENT DATE:  [facility] Adult PT Treatment:                                                DATE: 11/11/23 Therapeutic Exercise: Nustep L2 6 min Neuromuscular re-ed: SAQ 15x2 FAQs w/adduction 15x Therapeutic Activity: Heel slides w/strap 15x2 95d flexion Heel/toe raises Supine hip fallouts GTB 15xB, 15/15 unilaterally Seated hamstring curls GTB 15x2   [facility] Adult PT Treatment:   (65m late)                   DATE: 11/05/2023 Therapeutic Exercise: PROM into L knee flexion/ext (0-90) Neuromuscular re-ed: Quad set 2x8, hold 8s Supine SLR 2x8, hold 3s   [facility] Adult PT Treatment:                                                DATE: 10/30/23 Eval and HEP Self Care: Additional minutes spent for educating on updated Therapeutic Home Exercise Program as well as comparing current status to condition at start of symptoms. This included exercises focusing on stretching, strengthening, with focus on eccentric aspects. Long term goals include an improvement in range of motion, strength, endurance as well as avoiding reinjury. Patient's frequency would include in 1-2 times a day, 3-5 times a week for a duration  of 6-12 weeks. Proper technique shown and discussed handout in great detail. All questions were discussed and addressed.      PATIENT EDUCATION:  Education details: Discussed eval findings, rehab rationale and POC and  patient is in agreement  Person educated: Patient Education method: Explanation Education comprehension: verbalized understanding and needs further education  HOME EXERCISE PROGRAM: Access Code: [email or web address] URL: [email or web address] Date: 10/30/2023 Prepared by: Gretta Leavens  Exercises - Supine Quad Set  - 5 x daily - 5 x weekly - 1 sets - 10 reps - 3s hold - Supine Heel Slide with Strap  - 5 x daily - 5 x weekly - 1 sets - 10 reps - Seated Heel Slide  - 5 x daily - 5 x weekly - 1 sets - 10 reps - 3s hold - Heel Toe Raises with Counter Support  - 5 x daily - 5 x weekly - 1 sets - 10 reps  ASSESSMENT:  CLINICAL IMPRESSION: Focus of session was flexion ROM and regaining quad control/activation.  Able to flex knee to 95d.  Emphasis placed on TKE via FAQs, SAQs.  LLE edema evident at ankle.  Caution today as sutures removed earlier.    Eval impression: Patient is a 58 y.o. male who was seen today for physical therapy evaluation and treatment s/p R TKA on 10/26/23. Patient ambulating with step through pattern using RW, requires assist to bring LLE onto bed.  Mild ROM restrictions in extension, 85d flexion following stretch.  Pain levels controlled with meds and ice compression, able to generate a fair quad set.   OBJECTIVE IMPAIRMENTS: Abnormal gait, decreased activity tolerance, decreased endurance, decreased mobility, difficulty walking, decreased ROM, decreased strength, and pain.   ACTIVITY LIMITATIONS: carrying, lifting, sitting, standing, squatting, stairs, and transfers  PERSONAL FACTORS: Age, Fitness, and Past/current experiences are also affecting patient's functional outcome.   REHAB POTENTIAL: Good  CLINICAL DECISION MAKING: Stable/uncomplicated  EVALUATION COMPLEXITY: Low   GOALS: Goals reviewed with patient? No  SHORT TERM GOALS: Target date: 11/26/2023   Patient to demonstrate independence in HEP  Baseline: [id number] Goal status: INITIAL  2.  Patient  to negotiate 8 stairs with most appropriate pattern Baseline: TBD Goal status: INITIAL   LONG TERM GOALS: Target date: 12/24/2023  Patient will acknowledge 4/10 pain at least once during episode of care   Baseline: 8 Goal status: INITIAL  2.  Patient will increase 30s chair stand reps from 0 to 6 with/without arms to demonstrate and improved functional ability with less pain/difficulty as well as reduce fall risk.  Baseline: 0 Goal status: INITIAL  3.  Patient will score at least 6/80 on FOTO to signify clinically meaningful improvement in functional abilities.   Baseline: 40/80 Goal status: INITIAL  4.  Increase knee ROM to 0d extension, 115d flexion Baseline:  A/PROM Right eval Left eval  Hip flexion    Hip extension    Hip abduction    Hip adduction    Hip internal rotation    Hip external rotation    Knee flexion  70/85d  Knee extension  /-3d   Goal status: INITIAL  5.  Increase L knee strength to 4/5 Baseline:  MMT Right eval Left eval  Hip flexion    Hip extension    Hip abduction    Hip adduction    Hip internal rotation    Hip external rotation    Knee flexion  3  Knee extension  3-   Goal status: INITIAL  6.  Re-assess 2 MWT to determine progress towards functional mobility Baseline:  Goal status: INITIAL   PLAN:  PT FREQUENCY: 2x/week  PT DURATION: 8 weeks  PLANNED INTERVENTIONS: 97110-Therapeutic exercises, 97530- Therapeutic activity, 97112- Neuromuscular re-education, 97535- Self Care, 16109- Manual therapy, [phone]- Gait training, Patient/Family education, Balance training, and Stair training  PLAN FOR NEXT SESSION: HEP review and update, manual techniques as appropriate, aerobic tasks, ROM and flexibility activities, strengthening and PREs, TPDN, gait and balance training as needed    For all possible CPT codes, reference the Planned Interventions line above.     Check all conditions that are expected to impact treatment: {Conditions  expected to impact treatment:Diabetes mellitus, Musculoskeletal disorders, and Complications related to surgery   If treatment provided at initial evaluation, no treatment charged due to lack of authorization.       Jeff Miciah Covelli PT  11/11/2023, 3:42 PM

## 2023-11-11 NOTE — Telephone Encounter (Signed)
 Spoke to patient to reschedule colonoscopy procedure to a later date at which point he should have already completed his Eliquis  course of 1 month dosing following knee replacement done on 10/26/23. Patient has rescheduled his colonoscopy procedure to 12/28/23 and has scheduled a telephone previsit on 12/09/23 to confirm no other health history/medication changes have occurred before procedure. Patient verbalizes understanding of time/date/location of appointments.

## 2023-11-11 NOTE — Progress Notes (Signed)
 Post-Op Visit Note   Patient: Justin Burnett           Date of Birth: 05-11-1966           MRN: [id number] Visit Date: 11/11/2023 PCP: [name], NP   Assessment & Plan:  Chief Complaint:  Chief Complaint  Patient presents with   Left Knee - Pain   Visit Diagnoses:  1. Status post total left knee replacement     Plan: Patient is a pleasant 58 year old gentleman who comes in today 2 weeks status post left total knee replacement 10/26/2023.  He has been doing well.  He has been taking oxycodone  and Robaxin  for pain.  He has been taking Eliquis  for DVT prophylaxis.  He has been in outpatient physical therapy for the past 2 weeks ambulating with a walker.  Examination of the left knee reveals a well-healing surgical incision with nylon sutures in place.  No evidence of infection or cellulitis.  He does have moderate swelling throughout the entire left leg.  Calf is soft and nontender.  He is neurovascularly intact distally.  Today, sutures were removed and Steri-Strips applied.  I recommended wearing compression socks and have provided him with a prescription for [facility] medical supply for a new pair of these.  I have refilled his oxycodone .  He will continue with the Eliquis  for 2 more weeks and then transition to a baby aspirin twice daily for 2 weeks.  He will follow-up with us  in 4 weeks for repeat evaluation and 2 view x-rays of the left knee.  At that point, he would like to discuss scheduling right total knee replacement.  Call with concerns or questions in the meantime.  Follow-Up Instructions: Return in about 4 weeks (around 12/09/2023) for f/u with Dr. Christiane Cowing to also discuss scheduling right TKA.   Orders:  No orders of the defined types were placed in this encounter.  Meds ordered this encounter  Medications   oxyCODONE -acetaminophen  (PERCOCET) 5-325 MG tablet    Sig: Take 1-2 tablets by mouth every 8 (eight) hours as needed. (to be taken after surgery)    Dispense:  40 tablet     Refill:  0    Imaging: No new imaging  PMFS History: Patient Active Problem List   Diagnosis Date Noted   Status post total left knee replacement 10/26/2023   Preop cardiovascular exam 08/11/2023   Obesity (BMI 30.0-34.9) 08/11/2023   Cardiac murmur 08/11/2023   Cigarette smoker 08/11/2023   Prominent abdominal aortic pulsation 08/11/2023   Hypertension    Nerve damage    Neuromuscular disorder (HCC)    Abnormal EKG    Osteoarthritis of knees, bilateral 06/13/2022   Paresthesia 03/13/2022   Chronic bilateral low back pain without sciatica 03/13/2022   Hyperlipidemia 10/17/2021   Prediabetes 10/17/2021   Essential (primary) hypertension 09/26/2021   Neuropathy    Osteoarthritis 11/03/2011   Past Medical History:  Diagnosis Date   Abnormal EKG    Chronic bilateral low back pain without sciatica 03/13/2022   Essential (primary) hypertension 09/26/2021   Hyperlipidemia 10/17/2021   Hypertension    Nerve damage    Neuropathy    Osteoarthritis 11/03/2011   Osteoarthritis of knees, bilateral 06/13/2022   Paresthesia 03/13/2022   Prediabetes 10/17/2021    Family History  Problem Relation Age of Onset   Colon cancer Neg Hx    Esophageal cancer Neg Hx    Rectal cancer Neg Hx    Stomach cancer Neg Hx  Colon polyps Neg Hx     Past Surgical History:  Procedure Laterality Date   FINGER SURGERY Left    As a child. Tip of middle finger cut off in wood shop   TOTAL KNEE ARTHROPLASTY Left 10/26/2023   Procedure: ARTHROPLASTY, KNEE, TOTAL;  Surgeon: Wes Hamman, MD;  Location: [facility] OR;  Service: Orthopedics;  Laterality: Left;   Social History   Occupational History   Not on file  Tobacco Use   Smoking status: Every Day    Current packs/day: 0.50    Average packs/day: 0.5 packs/day for 13.0 years (6.5 ttl pk-yrs)    Types: Cigarettes    Passive exposure: Current   Smokeless tobacco: Never  Vaping Use   Vaping status: Never Used  Substance and Sexual Activity    Alcohol use: Not Currently    Alcohol/week: 3.0 standard drinks of alcohol    Types: 3 Cans of beer per week   Drug use: No   Sexual activity: Not Currently

## 2023-11-12 ENCOUNTER — Telehealth: Payer: Self-pay

## 2023-11-12 ENCOUNTER — Ambulatory Visit

## 2023-11-12 NOTE — Telephone Encounter (Signed)
 TC due to missed visit.  Spoke directly to patient who lost track of time trying to address hand issue at IV site.  Reminded of next appointment and offered 1615 slot but declined.

## 2023-11-12 NOTE — Telephone Encounter (Signed)
 Spoke with Justin Burnett since Dr.Xu and Loris Ros are both out of the office.  Patient states that his hand started to swell a day or two ago. He didn't mention it at his appointment yesterday.  I recommended that he contact his PCP. He will let us  know if he can't reach them.

## 2023-11-12 NOTE — Telephone Encounter (Signed)
 Patient LM on triage phone states the IV spot was pretty itchy after surgery but now entire hand swollen ?common? ?need appt?

## 2023-11-17 ENCOUNTER — Ambulatory Visit: Attending: Physician Assistant | Admitting: Physical Therapy

## 2023-11-17 ENCOUNTER — Encounter: Payer: Self-pay | Admitting: Physical Therapy

## 2023-11-17 DIAGNOSIS — M25561 Pain in right knee: Secondary | ICD-10-CM | POA: Insufficient documentation

## 2023-11-17 DIAGNOSIS — R2689 Other abnormalities of gait and mobility: Secondary | ICD-10-CM | POA: Diagnosis not present

## 2023-11-17 DIAGNOSIS — Z96651 Presence of right artificial knee joint: Secondary | ICD-10-CM | POA: Insufficient documentation

## 2023-11-17 NOTE — Therapy (Signed)
 OUTPATIENT PHYSICAL THERAPY LOWER EXTREMITY EVALUATION   Patient Name: Justin Burnett MRN: [id number] DOB:[birth year], 58 y.o., male Today's Date: 11/17/2023  END OF SESSION:  PT End of Session - 11/17/23 1136     Visit Number 5    Number of Visits 16    Date for PT Re-Evaluation 12/30/23    Authorization Type MCD    PT Start Time 1134    PT Stop Time 1212    PT Time Calculation (min) 38 min    Activity Tolerance Patient tolerated treatment well    Behavior During Therapy [facility] for tasks assessed/performed               Past Medical History:  Diagnosis Date   Abnormal EKG    Chronic bilateral low back pain without sciatica 03/13/2022   Essential (primary) hypertension 09/26/2021   Hyperlipidemia 10/17/2021   Hypertension    Nerve damage    Neuropathy    Osteoarthritis 11/03/2011   Osteoarthritis of knees, bilateral 06/13/2022   Paresthesia 03/13/2022   Prediabetes 10/17/2021   Past Surgical History:  Procedure Laterality Date   FINGER SURGERY Left    As a child. Tip of middle finger cut off in wood shop   TOTAL KNEE ARTHROPLASTY Left 10/26/2023   Procedure: ARTHROPLASTY, KNEE, TOTAL;  Surgeon: Wes Hamman, MD;  Location: [facility] OR;  Service: Orthopedics;  Laterality: Left;   Patient Active Problem List   Diagnosis Date Noted   Status post total left knee replacement 10/26/2023   Preop cardiovascular exam 08/11/2023   Obesity (BMI 30.0-34.9) 08/11/2023   Cardiac murmur 08/11/2023   Cigarette smoker 08/11/2023   Prominent abdominal aortic pulsation 08/11/2023   Hypertension    Nerve damage    Neuromuscular disorder (HCC)    Abnormal EKG    Osteoarthritis of knees, bilateral 06/13/2022   Paresthesia 03/13/2022   Chronic bilateral low back pain without sciatica 03/13/2022   Hyperlipidemia 10/17/2021   Prediabetes 10/17/2021   Essential (primary) hypertension 09/26/2021   Neuropathy    Osteoarthritis 11/03/2011    PCP: [name], NP   REFERRING  PROVIDER: [name]  REFERRING DIAG: [phone] (ICD-10-CM) - Hx of total knee replacement, left 10/26/2023  THERAPY DIAG:  Acute pain of right knee  Other abnormalities of gait and mobility  History of total right knee replacement  Rationale for Evaluation and Treatment: Rehabilitation  ONSET DATE: 10/26/23 DOS  SUBJECTIVE:   SUBJECTIVE STATEMENT: Pt attended today's session with reports of 3/10 pain. Pt stated that they have maintained good compliance with current HEP.    PERTINENT HISTORY: Stephon is a very pleasant 2 year old gentleman with end-stage varus DJD of both knees. Treatment options were discussed again. We agreed to do a right knee cortisone injection with plans to do a left total knee arthroplasty in the near future. He will call [name] to schedule surgery. Risk benefits prognosis reviewed.  PAIN:  Are you having pain? Yes: NPRS scale: 4-8/10 Pain location: L knee Pain description: ache, sore, throb Aggravating factors: activity Relieving factors: rest ice  PRECAUTIONS: Fall  RED FLAGS: None   WEIGHT BEARING RESTRICTIONS: No  FALLS:  Has patient fallen in last 6 months? No  OCCUPATION: not working  PLOF: Independent  PATIENT GOALS: To get my knee working again  NEXT MD VISIT: 12/09/2023  OBJECTIVE:  Note: Objective measures were completed at Evaluation unless otherwise noted.  DIAGNOSTIC FINDINGS: none avaialble  PATIENT SURVEYS:  LEFS 6/80  MUSCLE LENGTH: Not tested  POSTURE: R knee varus  PALPATION: deferred  LOWER EXTREMITY ROM:  A/PROM Right eval Left eval  Hip flexion    Hip extension    Hip abduction    Hip adduction    Hip internal rotation    Hip external rotation    Knee flexion  70/85d  Knee extension  /-3d  Ankle dorsiflexion    Ankle plantarflexion    Ankle inversion    Ankle eversion     (Blank rows = not tested)  LOWER EXTREMITY MMT:  MMT Right eval Left eval  Hip flexion    Hip extension     Hip abduction    Hip adduction    Hip internal rotation    Hip external rotation    Knee flexion  3  Knee extension  3-  Ankle dorsiflexion    Ankle plantarflexion    Ankle inversion    Ankle eversion     (Blank rows = not tested)  LOWER EXTREMITY SPECIAL TESTS:  deferred  FUNCTIONAL TESTS:  30 seconds chair stand test 0 with RW 2 MWT 154ft  GAIT: Distance walked: 44ft x2 Assistive device utilized: Environmental consultant - 2 wheeled Level of assistance: Complete Independence Comments: antalgic gait                                                                                                                                TREATMENT: [facility] Adult PT Treatment:                                                DATE: 11/17/2023  Therapeutic Exercise: Recumbent bike 8' PROM in L knee flexion/ext Neuromuscular re-ed: Supine SLR with quad set 2x12, hold 5s up, 5s down Supine heel slide with strap and hamstring set 1x20, hold 5s  [facility] Adult PT Treatment:   (39m late)                   DATE: 11/05/2023 Therapeutic Exercise: PROM into L knee flexion/ext (0-90) Neuromuscular re-ed: Quad set 2x8, hold 8s Supine SLR 2x8, hold 3s   [facility] Adult PT Treatment:                                                DATE: 10/30/23 Eval and HEP Self Care: Additional minutes spent for educating on updated Therapeutic Home Exercise Program as well as comparing current status to condition at start of symptoms. This included exercises focusing on stretching, strengthening, with focus on eccentric aspects. Long term goals include an improvement in range of motion, strength, endurance as well as avoiding reinjury. Patient's frequency would include in 1-2 times a day, 3-5 times a week for  a duration of 6-12 weeks. Proper technique shown and discussed handout in great detail. All questions were discussed and addressed.      PATIENT EDUCATION:  Education details: Discussed eval findings, rehab rationale and POC and  patient is in agreement  Person educated: Patient Education method: Explanation Education comprehension: verbalized understanding and needs further education  HOME EXERCISE PROGRAM: Access Code: [email or web address] URL: [email or web address] Date: 10/30/2023 Prepared by: Gretta Leavens  Exercises - Supine Quad Set  - 5 x daily - 5 x weekly - 1 sets - 10 reps - 3s hold - Supine Heel Slide with Strap  - 5 x daily - 5 x weekly - 1 sets - 10 reps - Seated Heel Slide  - 5 x daily - 5 x weekly - 1 sets - 10 reps - 3s hold - Heel Toe Raises with Counter Support  - 5 x daily - 5 x weekly - 1 sets - 10 reps  ASSESSMENT:  CLINICAL IMPRESSION: Pt attended physical therapy session for continuation of treatment regarding L TKA.  Upon entrance to PT, L knee AROM measured at 2-95. Today's treatment focused on improvement of  quadriceps recruitment (5d extensor lag with SLR), L knee AROM and activity tolerance. Pt showed  good tolerance to administered treatment with no adverse effects by the end of session. Skilled intervention was utilized via activity modification for pt tolerance with task completion, functional progression/regression promoting best outcomes inline with current rehab goals, as well as minimal verbal/tactile cuing alongside no physical assistance for safe and appropriate performance of today's activities. Continue to progress as tolerated.    Eval impression: Patient is a 58 y.o. male who was seen today for physical therapy evaluation and treatment s/p R TKA on 10/26/23. Patient ambulating with step through pattern using RW, requires assist to bring LLE onto bed.  Mild ROM restrictions in extension, 85d flexion following stretch.  Pain levels controlled with meds and ice compression, able to generate a fair quad set.   OBJECTIVE IMPAIRMENTS: Abnormal gait, decreased activity tolerance, decreased endurance, decreased mobility, difficulty walking, decreased ROM, decreased strength, and  pain.   ACTIVITY LIMITATIONS: carrying, lifting, sitting, standing, squatting, stairs, and transfers  PERSONAL FACTORS: Age, Fitness, and Past/current experiences are also affecting patient's functional outcome.   REHAB POTENTIAL: Good  CLINICAL DECISION MAKING: Stable/uncomplicated  EVALUATION COMPLEXITY: Low   GOALS: Goals reviewed with patient? No  SHORT TERM GOALS: Target date: 11/26/2023   Patient to demonstrate independence in HEP  Baseline: [email or web address] Goal status: INITIAL  2.  Patient to negotiate 8 stairs with most appropriate pattern Baseline: TBD Goal status: INITIAL   LONG TERM GOALS: Target date: 12/24/2023  Patient will acknowledge 4/10 pain at least once during episode of care   Baseline: 8 Goal status: INITIAL  2.  Patient will increase 30s chair stand reps from 0 to 6 with/without arms to demonstrate and improved functional ability with less pain/difficulty as well as reduce fall risk.  Baseline: 0 Goal status: INITIAL  3.  Patient will score at least 6/80 on FOTO to signify clinically meaningful improvement in functional abilities.   Baseline: 40/80 Goal status: INITIAL  4.  Increase knee ROM to 0d extension, 115d flexion Baseline:  A/PROM Right eval Left eval  Hip flexion    Hip extension    Hip abduction    Hip adduction    Hip internal rotation    Hip external rotation    Knee flexion  70/85d  Knee extension  /-3d  Goal status: INITIAL  5.  Increase L knee strength to 4/5 Baseline:  MMT Right eval Left eval  Hip flexion    Hip extension    Hip abduction    Hip adduction    Hip internal rotation    Hip external rotation    Knee flexion  3  Knee extension  3-   Goal status: INITIAL  6.  Re-assess 2 MWT to determine progress towards functional mobility Baseline:  Goal status: INITIAL   PLAN:  PT FREQUENCY: 2x/week  PT DURATION: 8 weeks  PLANNED INTERVENTIONS: 97110-Therapeutic exercises, 97530- Therapeutic activity,  97112- Neuromuscular re-education, 97535- Self Care, 47829- Manual therapy, [phone]- Gait training, Patient/Family education, Balance training, and Stair training  PLAN FOR NEXT SESSION: HEP review and update, manual techniques as appropriate, aerobic tasks, ROM and flexibility activities, strengthening and PREs, TPDN, gait and balance training as needed    For all possible CPT codes, reference the Planned Interventions line above.     Check all conditions that are expected to impact treatment: {Conditions expected to impact treatment:Diabetes mellitus, Musculoskeletal disorders, and Complications related to surgery   If treatment provided at initial evaluation, no treatment charged due to lack of authorization.       Albesa Huguenin, PT, DPT 11/17/2023, 12:13 PM

## 2023-11-18 NOTE — Therapy (Unsigned)
 OUTPATIENT PHYSICAL THERAPY TREATMENT NOTE   Patient Name: Justin Burnett MRN: [id number] DOB:Oct 17, 1965, 58 y.o., male Today's Date: 11/19/2023  END OF SESSION:  PT End of Session - 11/19/23 1144     Visit Number 6    Number of Visits 16    Date for PT Re-Evaluation 12/30/23    Authorization Type MCD    Authorization Time Period Approved 13 visits 10/30/23-01/28/24    Authorization - Visit Number 6    Authorization - Number of Visits 13    PT Start Time 1145    PT Stop Time 1215    PT Time Calculation (min) 30 min    Activity Tolerance Patient tolerated treatment well    Behavior During Therapy [facility] for tasks assessed/performed                Past Medical History:  Diagnosis Date   Abnormal EKG    Chronic bilateral low back pain without sciatica 03/13/2022   Essential (primary) hypertension 09/26/2021   Hyperlipidemia 10/17/2021   Hypertension    Nerve damage    Neuropathy    Osteoarthritis 11/03/2011   Osteoarthritis of knees, bilateral 06/13/2022   Paresthesia 03/13/2022   Prediabetes 10/17/2021   Past Surgical History:  Procedure Laterality Date   FINGER SURGERY Left    As a child. Tip of middle finger cut off in wood shop   TOTAL KNEE ARTHROPLASTY Left 10/26/2023   Procedure: ARTHROPLASTY, KNEE, TOTAL;  Surgeon: Wes Hamman, MD;  Location: [facility] OR;  Service: Orthopedics;  Laterality: Left;   Patient Active Problem List   Diagnosis Date Noted   Status post total left knee replacement 10/26/2023   Preop cardiovascular exam 08/11/2023   Obesity (BMI 30.0-34.9) 08/11/2023   Cardiac murmur 08/11/2023   Cigarette smoker 08/11/2023   Prominent abdominal aortic pulsation 08/11/2023   Hypertension    Nerve damage    Neuromuscular disorder (HCC)    Abnormal EKG    Osteoarthritis of knees, bilateral 06/13/2022   Paresthesia 03/13/2022   Chronic bilateral low back pain without sciatica 03/13/2022   Hyperlipidemia 10/17/2021   Prediabetes 10/17/2021    Essential (primary) hypertension 09/26/2021   Neuropathy    Osteoarthritis 11/03/2011    PCP: [name], NP   REFERRING PROVIDER: [name]  REFERRING DIAG: [phone] (ICD-10-CM) - Hx of total knee replacement, left 10/26/2023  THERAPY DIAG:  Acute pain of right knee  Other abnormalities of gait and mobility  History of total right knee replacement  Rationale for Evaluation and Treatment: Rehabilitation  ONSET DATE: 10/26/23 DOS  SUBJECTIVE:   SUBJECTIVE STATEMENT: 3/10 L knee symptoms, R knee more problematic.  Has been compliant with HEP per his report   PERTINENT HISTORY: Tyde is a very pleasant 58 year old gentleman with end-stage varus DJD of both knees. Treatment options were discussed again. We agreed to do a right knee cortisone injection with plans to do a left total knee arthroplasty in the near future. He will call [name] to schedule surgery. Risk benefits prognosis reviewed.  PAIN:  Are you having pain? Yes: NPRS scale: 4-8/10 Pain location: L knee Pain description: ache, sore, throb Aggravating factors: activity Relieving factors: rest ice  PRECAUTIONS: Fall  RED FLAGS: None   WEIGHT BEARING RESTRICTIONS: No  FALLS:  Has patient fallen in last 6 months? No  OCCUPATION: not working  PLOF: Independent  PATIENT GOALS: To get my knee working again  NEXT MD VISIT: 12/09/2023  OBJECTIVE:  Note: Objective measures  were completed at Evaluation unless otherwise noted.  DIAGNOSTIC FINDINGS: none avaialble  PATIENT SURVEYS:  LEFS 6/80  MUSCLE LENGTH: Not tested  POSTURE: R knee varus  PALPATION: deferred  LOWER EXTREMITY ROM:  A/PROM Right eval Left eval  Hip flexion    Hip extension    Hip abduction    Hip adduction    Hip internal rotation    Hip external rotation    Knee flexion  70/85d  Knee extension  /-3d  Ankle dorsiflexion    Ankle plantarflexion    Ankle inversion    Ankle eversion     (Blank rows = not  tested)  LOWER EXTREMITY MMT:  MMT Right eval Left eval  Hip flexion    Hip extension    Hip abduction    Hip adduction    Hip internal rotation    Hip external rotation    Knee flexion  3  Knee extension  3-  Ankle dorsiflexion    Ankle plantarflexion    Ankle inversion    Ankle eversion     (Blank rows = not tested)  LOWER EXTREMITY SPECIAL TESTS:  deferred  FUNCTIONAL TESTS:  30 seconds chair stand test 0 with RW 2 MWT 132ft  GAIT: Distance walked: 53ft x2 Assistive device utilized: Environmental consultant - 2 wheeled Level of assistance: Complete Independence Comments: antalgic gait                                                                                                                                TREATMENT: [facility] Adult PT Treatment:                                                DATE: 11/19/23 Neuromuscular re-ed: QS 3s hold 15x SAQ 3# 15x Supine hip fallouts BluTB 15x B, 15x L FAQ with adduction Therapeutic Activity: Heel slides w/strap 15x 110 SLR 15x Standing TKE BluTB 15x Nustep L4 8 min  [facility] Adult PT Treatment:                                                DATE: 11/17/2023  Therapeutic Exercise: Recumbent bike 8' PROM in L knee flexion/ext Neuromuscular re-ed: Supine SLR with quad set 2x12, hold 5s up, 5s down Supine heel slide with strap and hamstring set 1x20, hold 5s  [facility] Adult PT Treatment:                                                DATE: 11/11/23 Therapeutic Exercise: Nustep L2 6 min  Neuromuscular re-ed: SAQ 15x2 FAQs w/adduction 15x Therapeutic Activity: Heel slides w/strap 15x2 95d flexion Heel/toe raises Supine hip fallouts GTB 15xB, 15/15 unilaterally Seated hamstring curls GTB 15x2 [facility] Adult PT Treatment:   (63m late)                   DATE: 11/05/2023 Therapeutic Exercise: PROM into L knee flexion/ext (0-90) Neuromuscular re-ed: Quad set 2x8, hold 8s Supine SLR 2x8, hold 3s   [facility] Adult PT Treatment:                                                 DATE: 10/30/23 Eval and HEP Self Care: Additional minutes spent for educating on updated Therapeutic Home Exercise Program as well as comparing current status to condition at start of symptoms. This included exercises focusing on stretching, strengthening, with focus on eccentric aspects. Long term goals include an improvement in range of motion, strength, endurance as well as avoiding reinjury. Patient's frequency would include in 1-2 times a day, 3-5 times a week for a duration of 6-12 weeks. Proper technique shown and discussed handout in great detail. All questions were discussed and addressed.      PATIENT EDUCATION:  Education details: Discussed eval findings, rehab rationale and POC and patient is in agreement  Person educated: Patient Education method: Explanation Education comprehension: verbalized understanding and needs further education  HOME EXERCISE PROGRAM: Access Code: [email or web address] URL: [email or web address] Date: 10/30/2023 Prepared by: Gretta Leavens  Exercises - Supine Quad Set  - 5 x daily - 5 x weekly - 1 sets - 10 reps - 3s hold - Supine Heel Slide with Strap  - 5 x daily - 5 x weekly - 1 sets - 10 reps - Seated Heel Slide  - 5 x daily - 5 x weekly - 1 sets - 10 reps - 3s hold - Heel Toe Raises with Counter Support  - 5 x daily - 5 x weekly - 1 sets - 10 reps  ASSESSMENT:  CLINICAL IMPRESSION:  Excellent gains in flexion ROM but still shows an extensor lag with SLR.  Added ipsilateral hip strengthening and focused on TKE function.  Consider transition to cane next session.    Eval impression: Patient is a 58 y.o. male who was seen today for physical therapy evaluation and treatment s/p R TKA on 10/26/23. Patient ambulating with step through pattern using RW, requires assist to bring LLE onto bed.  Mild ROM restrictions in extension, 85d flexion following stretch.  Pain levels controlled with meds and ice compression, able to  generate a fair quad set.   OBJECTIVE IMPAIRMENTS: Abnormal gait, decreased activity tolerance, decreased endurance, decreased mobility, difficulty walking, decreased ROM, decreased strength, and pain.   ACTIVITY LIMITATIONS: carrying, lifting, sitting, standing, squatting, stairs, and transfers  PERSONAL FACTORS: Age, Fitness, and Past/current experiences are also affecting patient's functional outcome.   REHAB POTENTIAL: Good  CLINICAL DECISION MAKING: Stable/uncomplicated  EVALUATION COMPLEXITY: Low   GOALS: Goals reviewed with patient? No  SHORT TERM GOALS: Target date: 11/26/2023   Patient to demonstrate independence in HEP  Baseline: [email or web address] Goal status: INITIAL  2.  Patient to negotiate 8 stairs with most appropriate pattern Baseline: TBD Goal status: INITIAL   LONG TERM GOALS: Target date: 12/24/2023  Patient will acknowledge 4/10 pain at least once during episode of care   Baseline:  8 Goal status: INITIAL  2.  Patient will increase 30s chair stand reps from 0 to 6 with/without arms to demonstrate and improved functional ability with less pain/difficulty as well as reduce fall risk.  Baseline: 0 Goal status: INITIAL  3.  Patient will score at least 6/80 on FOTO to signify clinically meaningful improvement in functional abilities.   Baseline: 40/80 Goal status: INITIAL  4.  Increase knee ROM to 0d extension, 115d flexion Baseline:  A/PROM Right eval Left eval  Hip flexion    Hip extension    Hip abduction    Hip adduction    Hip internal rotation    Hip external rotation    Knee flexion  70/85d  Knee extension  /-3d   Goal status: INITIAL  5.  Increase L knee strength to 4/5 Baseline:  MMT Right eval Left eval  Hip flexion    Hip extension    Hip abduction    Hip adduction    Hip internal rotation    Hip external rotation    Knee flexion  3  Knee extension  3-   Goal status: INITIAL  6.  Re-assess 2 MWT to determine progress towards  functional mobility Baseline:  Goal status: INITIAL   PLAN:  PT FREQUENCY: 2x/week  PT DURATION: 8 weeks  PLANNED INTERVENTIONS: 97110-Therapeutic exercises, 97530- Therapeutic activity, 97112- Neuromuscular re-education, 97535- Self Care, 44034- Manual therapy, [phone]- Gait training, Patient/Family education, Balance training, and Stair training  PLAN FOR NEXT SESSION: HEP review and update, manual techniques as appropriate, aerobic tasks, ROM and flexibility activities, strengthening and PREs, TPDN, gait and balance training as needed    For all possible CPT codes, reference the Planned Interventions line above.     Check all conditions that are expected to impact treatment: {Conditions expected to impact treatment:Diabetes mellitus, Musculoskeletal disorders, and Complications related to surgery   If treatment provided at initial evaluation, no treatment charged due to lack of authorization.       Jeff Terianna Peggs PT  11/19/2023, 12:09 PM

## 2023-11-19 ENCOUNTER — Ambulatory Visit

## 2023-11-19 DIAGNOSIS — M25561 Pain in right knee: Secondary | ICD-10-CM

## 2023-11-19 DIAGNOSIS — R2689 Other abnormalities of gait and mobility: Secondary | ICD-10-CM

## 2023-11-19 DIAGNOSIS — Z96651 Presence of right artificial knee joint: Secondary | ICD-10-CM

## 2023-11-23 ENCOUNTER — Other Ambulatory Visit: Payer: Self-pay | Admitting: Physician Assistant

## 2023-11-23 ENCOUNTER — Telehealth: Payer: Self-pay | Admitting: Orthopaedic Surgery

## 2023-11-23 ENCOUNTER — Other Ambulatory Visit (HOSPITAL_COMMUNITY): Payer: Self-pay

## 2023-11-23 NOTE — Telephone Encounter (Signed)
 Patient called. Would like oxycodone called in for him.

## 2023-11-24 ENCOUNTER — Other Ambulatory Visit: Payer: Self-pay | Admitting: Physician Assistant

## 2023-11-24 ENCOUNTER — Encounter (HOSPITAL_COMMUNITY): Payer: Self-pay

## 2023-11-24 ENCOUNTER — Ambulatory Visit: Admitting: Physical Therapy

## 2023-11-24 ENCOUNTER — Other Ambulatory Visit (HOSPITAL_COMMUNITY): Payer: Self-pay

## 2023-11-24 MED ORDER — HYDROCODONE-ACETAMINOPHEN 5-325 MG PO TABS
1.0000 | ORAL_TABLET | Freq: Three times a day (TID) | ORAL | 0 refills | Status: DC | PRN
  Filled 2023-11-24: qty 21, 7d supply, fill #0

## 2023-11-24 NOTE — Therapy (Unsigned)
 OUTPATIENT PHYSICAL THERAPY TREATMENT NOTE   Patient Name: Justin Burnett MRN: [id number] DOB:Sep 19, 1965, 58 y.o., male Today's Date: 11/26/2023  END OF SESSION:  PT End of Session - 11/26/23 1313     Visit Number 7    Number of Visits 16    Date for PT Re-Evaluation 12/30/23    Authorization Type MCD    Authorization Time Period Approved 13 visits 10/30/23-01/28/24    Authorization - Visit Number 7    Authorization - Number of Visits 13    PT Start Time 1315    PT Stop Time 1400    PT Time Calculation (min) 45 min    Activity Tolerance Patient tolerated treatment well    Behavior During Therapy [facility] for tasks assessed/performed              Past Medical History:  Diagnosis Date   Abnormal EKG    Chronic bilateral low back pain without sciatica 03/13/2022   Essential (primary) hypertension 09/26/2021   Hyperlipidemia 10/17/2021   Hypertension    Nerve damage    Neuropathy    Osteoarthritis 11/03/2011   Osteoarthritis of knees, bilateral 06/13/2022   Paresthesia 03/13/2022   Prediabetes 10/17/2021   Past Surgical History:  Procedure Laterality Date   FINGER SURGERY Left    As a child. Tip of middle finger cut off in wood shop   TOTAL KNEE ARTHROPLASTY Left 10/26/2023   Procedure: ARTHROPLASTY, KNEE, TOTAL;  Surgeon: Justin Hamman, MD;  Location: [facility] OR;  Service: Orthopedics;  Laterality: Left;   Patient Active Problem List   Diagnosis Date Noted   Status post total left knee replacement 10/26/2023   Preop cardiovascular exam 08/11/2023   Obesity (BMI 30.0-34.9) 08/11/2023   Cardiac murmur 08/11/2023   Cigarette smoker 08/11/2023   Prominent abdominal aortic pulsation 08/11/2023   Hypertension    Nerve damage    Neuromuscular disorder (HCC)    Abnormal EKG    Osteoarthritis of knees, bilateral 06/13/2022   Paresthesia 03/13/2022   Chronic bilateral low back pain without sciatica 03/13/2022   Hyperlipidemia 10/17/2021   Prediabetes 10/17/2021    Essential (primary) hypertension 09/26/2021   Neuropathy    Osteoarthritis 11/03/2011    PCP: Justin Dama, NP   REFERRING PROVIDER: Leigh Burnett  REFERRING DIAG: [phone] (ICD-10-CM) - Hx of total knee replacement, left 10/26/2023  THERAPY DIAG:  Acute pain of right knee  Other abnormalities of gait and mobility  History of total right knee replacement  Rationale for Evaluation and Treatment: Rehabilitation  ONSET DATE: 10/26/23 DOS  SUBJECTIVE:   SUBJECTIVE STATEMENT: 3/10 L knee symptoms, R knee more problematic.  Has been compliant with HEP per his report   PERTINENT HISTORY: Justin Burnett is a very pleasant 58 year old gentleman with end-stage varus DJD of both knees. Treatment options were discussed again. We agreed to do a right knee cortisone injection with plans to do a left total knee arthroplasty in the near future. He will call Justin Burnett to schedule surgery. Risk benefits prognosis reviewed.  PAIN:  Are you having pain? Yes: NPRS scale: 4-8/10 Pain location: L knee Pain description: ache, sore, throb Aggravating factors: activity Relieving factors: rest ice  PRECAUTIONS: Fall  RED FLAGS: None   WEIGHT BEARING RESTRICTIONS: No  FALLS:  Has patient fallen in last 6 months? No  OCCUPATION: not working  PLOF: Independent  PATIENT GOALS: To get my knee working again  NEXT MD VISIT: 12/09/2023  OBJECTIVE:  Note: Objective measures were completed  at Evaluation unless otherwise noted.  DIAGNOSTIC FINDINGS: none avaialble  PATIENT SURVEYS:  LEFS 6/80  MUSCLE LENGTH: Not tested  POSTURE: R knee varus  PALPATION: deferred  LOWER EXTREMITY ROM:  A/PROM Right eval Left eval  Hip flexion    Hip extension    Hip abduction    Hip adduction    Hip internal rotation    Hip external rotation    Knee flexion  70/85d  Knee extension  /-3d  Ankle dorsiflexion    Ankle plantarflexion    Ankle inversion    Ankle eversion     (Blank rows = not  tested)  LOWER EXTREMITY MMT:  MMT Right eval Left eval  Hip flexion    Hip extension    Hip abduction    Hip adduction    Hip internal rotation    Hip external rotation    Knee flexion  3  Knee extension  3-  Ankle dorsiflexion    Ankle plantarflexion    Ankle inversion    Ankle eversion     (Blank rows = not tested)  LOWER EXTREMITY SPECIAL TESTS:  deferred  FUNCTIONAL TESTS:  30 seconds chair stand test 0 with RW 2 MWT 137ft  GAIT: Distance walked: 6ft x2 Assistive device utilized: Environmental consultant - 2 wheeled Level of assistance: Complete Independence Comments: antalgic gait                                                                                                                                TREATMENT: [facility] Adult PT Treatment:                                                DATE: 11/26/23 Therapeutic Exercise: Nustep L4 8 min 40 SPM Neuromuscular re-ed: Runners step 4 in 10x L Sidestepping at counter 2 trips against yellow band Standing TKE BluTB 15x Therapeutic Activity: Supine hip fallouts BluTB 15x B, 15/15 unilaterally SAQs 5# 15x2 Seated hamstring curls BluTB 15x Hhel slides with strap 15x 113d  [facility] Adult PT Treatment:                                                DATE: 11/19/23 Neuromuscular re-ed: QS 3s hold 15x SAQ 3# 15x Supine hip fallouts BluTB 15x B, 15x L FAQ with adduction Therapeutic Activity: Heel slides w/strap 15x 110 SLR 15x Standing TKE BluTB 15x Nustep L4 8 min  [facility] Adult PT Treatment:  DATE: 11/17/2023  Therapeutic Exercise: Recumbent bike 8' PROM in L knee flexion/ext Neuromuscular re-ed: Supine SLR with quad set 2x12, hold 5s up, 5s down Supine heel slide with strap and hamstring set 1x20, hold 5s  [facility] Adult PT Treatment:                                                DATE: 11/11/23 Therapeutic Exercise: Nustep L2 6 min Neuromuscular re-ed: SAQ 15x2 FAQs w/adduction  15x Therapeutic Activity: Heel slides w/strap 15x2 95d flexion Heel/toe raises Supine hip fallouts GTB 15xB, 15/15 unilaterally Seated hamstring curls GTB 15x2 [facility] Adult PT Treatment:   (50m late)                   DATE: 11/05/2023 Therapeutic Exercise: PROM into L knee flexion/ext (0-90) Neuromuscular re-ed: Quad set 2x8, hold 8s Supine SLR 2x8, hold 3s   [facility] Adult PT Treatment:                                                DATE: 10/30/23 Eval and HEP Self Care: Additional minutes spent for educating on updated Therapeutic Home Exercise Program as well as comparing current status to condition at start of symptoms. This included exercises focusing on stretching, strengthening, with focus on eccentric aspects. Long term goals include an improvement in range of motion, strength, endurance as well as avoiding reinjury. Patient's frequency would include in 1-2 times a day, 3-5 times a week for a duration of 6-12 weeks. Proper technique shown and discussed handout in great detail. All questions were discussed and addressed.      PATIENT EDUCATION:  Education details: Discussed eval findings, rehab rationale and POC and patient is in agreement  Person educated: Patient Education method: Explanation Education comprehension: verbalized understanding and needs further education  HOME EXERCISE PROGRAM: Access Code: [email or web address] URL: [email or web address] Date: 10/30/2023 Prepared by: Gretta Leavens  Exercises - Supine Quad Set  - 5 x daily - 5 x weekly - 1 sets - 10 reps - 3s hold - Supine Heel Slide with Strap  - 5 x daily - 5 x weekly - 1 sets - 10 reps - Seated Heel Slide  - 5 x daily - 5 x weekly - 1 sets - 10 reps - 3s hold - Heel Toe Raises with Counter Support  - 5 x daily - 5 x weekly - 1 sets - 10 reps  ASSESSMENT:  CLINICAL IMPRESSION: Continues to gain flexion.  Incorporated CKC tasks, stepping activities and SLS work.  Emphasized flexion ROM and TKE strength.   Able to step up onto 4 in block leading L.    Eval impression: Patient is a 58 y.o. male who was seen today for physical therapy evaluation and treatment s/p R TKA on 10/26/23. Patient ambulating with step through pattern using RW, requires assist to bring LLE onto bed.  Mild ROM restrictions in extension, 85d flexion following stretch.  Pain levels controlled with meds and ice compression, able to generate a fair quad set.   OBJECTIVE IMPAIRMENTS: Abnormal gait, decreased activity tolerance, decreased endurance, decreased mobility, difficulty walking, decreased ROM, decreased strength, and pain.   ACTIVITY LIMITATIONS: carrying, lifting, sitting, standing, squatting, stairs, and transfers  PERSONAL FACTORS: Age, Fitness, and Past/current experiences are also affecting patient's functional outcome.   REHAB POTENTIAL: Good  CLINICAL DECISION MAKING: Stable/uncomplicated  EVALUATION COMPLEXITY: Low   GOALS: Goals reviewed with patient? No  SHORT TERM GOALS: Target date: 11/26/2023   Patient to demonstrate independence in HEP  Baseline: [email or web address] Goal status: INITIAL  2.  Patient to negotiate 8 stairs with most appropriate pattern Baseline: TBD Goal status: INITIAL   LONG TERM GOALS: Target date: 12/24/2023  Patient will acknowledge 4/10 pain at least once during episode of care   Baseline: 8 Goal status: INITIAL  2.  Patient will increase 30s chair stand reps from 0 to 6 with/without arms to demonstrate and improved functional ability with less pain/difficulty as well as reduce fall risk.  Baseline: 0 Goal status: INITIAL  3.  Patient will score at least 6/80 on FOTO to signify clinically meaningful improvement in functional abilities.   Baseline: 40/80 Goal status: INITIAL  4.  Increase knee ROM to 0d extension, 115d flexion Baseline:  A/PROM Right eval Left eval  Hip flexion    Hip extension    Hip abduction    Hip adduction    Hip internal rotation    Hip  external rotation    Knee flexion  70/85d  Knee extension  /-3d   Goal status: INITIAL  5.  Increase L knee strength to 4/5 Baseline:  MMT Right eval Left eval  Hip flexion    Hip extension    Hip abduction    Hip adduction    Hip internal rotation    Hip external rotation    Knee flexion  3  Knee extension  3-   Goal status: INITIAL  6.  Re-assess 2 MWT to determine progress towards functional mobility Baseline:  Goal status: INITIAL   PLAN:  PT FREQUENCY: 2x/week  PT DURATION: 8 weeks  PLANNED INTERVENTIONS: 97110-Therapeutic exercises, 97530- Therapeutic activity, 97112- Neuromuscular re-education, 97535- Self Care, 16109- Manual therapy, [phone]- Gait training, Patient/Family education, Balance training, and Stair training  PLAN FOR NEXT SESSION: HEP review and update, manual techniques as appropriate, aerobic tasks, ROM and flexibility activities, strengthening and PREs, TPDN, gait and balance training as needed    For all possible CPT codes, reference the Planned Interventions line above.     Check all conditions that are expected to impact treatment: {Conditions expected to impact treatment:Diabetes mellitus, Musculoskeletal disorders, and Complications related to surgery   If treatment provided at initial evaluation, no treatment charged due to lack of authorization.       Jeff Madissen Wyse PT  11/26/2023, 2:01 PM

## 2023-11-24 NOTE — Telephone Encounter (Signed)
Weaning to norco and sent in

## 2023-11-24 NOTE — Telephone Encounter (Signed)
 Called. No answer. LMOM.

## 2023-11-24 NOTE — Therapy (Incomplete)
 OUTPATIENT PHYSICAL THERAPY TREATMENT NOTE   Patient Name: Justin Burnett MRN: [id number] DOB:07-Jun-1966, 58 y.o., male Today's Date: 11/24/2023  END OF SESSION:       Past Medical History:  Diagnosis Date   Abnormal EKG    Chronic bilateral low back pain without sciatica 03/13/2022   Essential (primary) hypertension 09/26/2021   Hyperlipidemia 10/17/2021   Hypertension    Nerve damage    Neuropathy    Osteoarthritis 11/03/2011   Osteoarthritis of knees, bilateral 06/13/2022   Paresthesia 03/13/2022   Prediabetes 10/17/2021   Past Surgical History:  Procedure Laterality Date   FINGER SURGERY Left    As a child. Tip of middle finger cut off in wood shop   TOTAL KNEE ARTHROPLASTY Left 10/26/2023   Procedure: ARTHROPLASTY, KNEE, TOTAL;  Surgeon: Wes Hamman, MD;  Location: [facility] OR;  Service: Orthopedics;  Laterality: Left;   Patient Active Problem List   Diagnosis Date Noted   Status post total left knee replacement 10/26/2023   Preop cardiovascular exam 08/11/2023   Obesity (BMI 30.0-34.9) 08/11/2023   Cardiac murmur 08/11/2023   Cigarette smoker 08/11/2023   Prominent abdominal aortic pulsation 08/11/2023   Hypertension    Nerve damage    Neuromuscular disorder (HCC)    Abnormal EKG    Osteoarthritis of knees, bilateral 06/13/2022   Paresthesia 03/13/2022   Chronic bilateral low back pain without sciatica 03/13/2022   Hyperlipidemia 10/17/2021   Prediabetes 10/17/2021   Essential (primary) hypertension 09/26/2021   Neuropathy    Osteoarthritis 11/03/2011    PCP: [name], NP   REFERRING PROVIDER: [name]  REFERRING DIAG: [phone] (ICD-10-CM) - Hx of total knee replacement, left 10/26/2023  THERAPY DIAG:  No diagnosis found.  Rationale for Evaluation and Treatment: Rehabilitation  ONSET DATE: 10/26/23 DOS  SUBJECTIVE:   SUBJECTIVE STATEMENT: 3/10 L knee symptoms, R knee more problematic.  Has been compliant with HEP per his  report   PERTINENT HISTORY: Justin Burnett is a very pleasant 58 year old gentleman with end-stage varus DJD of both knees. Treatment options were discussed again. We agreed to do a right knee cortisone injection with plans to do a left total knee arthroplasty in the near future. He will call Justin Burnett to schedule surgery. Risk benefits prognosis reviewed.  PAIN:  Are you having pain? Yes: NPRS scale: 4-8/10 Pain location: L knee Pain description: ache, sore, throb Aggravating factors: activity Relieving factors: rest ice  PRECAUTIONS: Fall  RED FLAGS: None   WEIGHT BEARING RESTRICTIONS: No  FALLS:  Has patient fallen in last 6 months? No  OCCUPATION: not working  PLOF: Independent  PATIENT GOALS: To get my knee working again  NEXT MD VISIT: 12/09/2023  OBJECTIVE:  Note: Objective measures were completed at Evaluation unless otherwise noted.  DIAGNOSTIC FINDINGS: none avaialble  PATIENT SURVEYS:  LEFS 6/80  MUSCLE LENGTH: Not tested  POSTURE: R knee varus  PALPATION: deferred  LOWER EXTREMITY ROM:  A/PROM Right eval Left eval  Hip flexion    Hip extension    Hip abduction    Hip adduction    Hip internal rotation    Hip external rotation    Knee flexion  70/85d  Knee extension  /-3d  Ankle dorsiflexion    Ankle plantarflexion    Ankle inversion    Ankle eversion     (Blank rows = not tested)  LOWER EXTREMITY MMT:  MMT Right eval Left eval  Hip flexion    Hip extension  Hip abduction    Hip adduction    Hip internal rotation    Hip external rotation    Knee flexion  3  Knee extension  3-  Ankle dorsiflexion    Ankle plantarflexion    Ankle inversion    Ankle eversion     (Blank rows = not tested)  LOWER EXTREMITY SPECIAL TESTS:  deferred  FUNCTIONAL TESTS:  30 seconds chair stand test 0 with RW 2 MWT 126ft  GAIT: Distance walked: 60ft x2 Assistive device utilized: Environmental consultant - 2 wheeled Level of assistance: Complete Independence Comments:  antalgic gait                                                                                                                                TREATMENT: [facility] Adult PT Treatment:                                                DATE: 11/24/2023  Therapeutic Exercise: PROM into knee flex/ext Heel slide with strap Therapeutic Activity: NuStep  8' for activity tolerance Long siting hip flexion with abd/add over cone 2x8 ea.  Standing TKE with ball into wall 2x8, hold 8s   [facility] Adult PT Treatment:                                                DATE: 11/19/23 Neuromuscular re-ed: QS 3s hold 15x SAQ 3# 15x Supine hip fallouts BluTB 15x B, 15x L FAQ with adduction Therapeutic Activity: Heel slides w/strap 15x 110 SLR 15x Standing TKE BluTB 15x Nustep L4 8 min  [facility] Adult PT Treatment:                                                DATE: 11/17/2023  Therapeutic Exercise: Recumbent bike 8' PROM in L knee flexion/ext Neuromuscular re-ed: Supine SLR with quad set 2x12, hold 5s up, 5s down Supine heel slide with strap and hamstring set 1x20, hold 5s    PATIENT EDUCATION:  Education details: Discussed eval findings, rehab rationale and POC and patient is in agreement  Person educated: Patient Education method: Explanation Education comprehension: verbalized understanding and needs further education  HOME EXERCISE PROGRAM: Access Code: [email or web address] URL: [email or web address] Date: 10/30/2023 Prepared by: Gretta Leavens  Exercises - Supine Quad Set  - 5 x daily - 5 x weekly - 1 sets - 10 reps - 3s hold - Supine Heel Slide with Strap  - 5 x daily - 5 x weekly - 1 sets - 10 reps - Seated  Heel Slide  - 5 x daily - 5 x weekly - 1 sets - 10 reps - 3s hold - Heel Toe Raises with Counter Support  - 5 x daily - 5 x weekly - 1 sets - 10 reps  ASSESSMENT:  CLINICAL IMPRESSION:  Pt attended physical therapy session for continuation of treatment regarding ***. Today's treatment  focused on improvement of  ***. Pt showed  *** tolerance to administered treatment with *** adverse effects by the end of session. Skilled intervention was utilized via activity modification for pt tolerance with task completion, functional progression/regression promoting best outcomes inline with current rehab goals, as well as *** verbal/tactile cuing alongside *** physical assistance for safe and appropriate performance of today's activities. Continue with therapeutic focus on ***.    Excellent gains in flexion ROM but still shows an extensor lag with SLR.  Added ipsilateral hip strengthening and focused on TKE function.  Consider transition to cane next session.***  Eval impression: Patient is a 58 y.o. male who was seen today for physical therapy evaluation and treatment s/p R TKA on 10/26/23. Patient ambulating with step through pattern using RW, requires assist to bring LLE onto bed.  Mild ROM restrictions in extension, 85d flexion following stretch.  Pain levels controlled with meds and ice compression, able to generate a fair quad set.   OBJECTIVE IMPAIRMENTS: Abnormal gait, decreased activity tolerance, decreased endurance, decreased mobility, difficulty walking, decreased ROM, decreased strength, and pain.   ACTIVITY LIMITATIONS: carrying, lifting, sitting, standing, squatting, stairs, and transfers  PERSONAL FACTORS: Age, Fitness, and Past/current experiences are also affecting patient's functional outcome.   REHAB POTENTIAL: Good  CLINICAL DECISION MAKING: Stable/uncomplicated  EVALUATION COMPLEXITY: Low   GOALS: Goals reviewed with patient? No  SHORT TERM GOALS: Target date: 11/26/2023   Patient to demonstrate independence in HEP  Baseline: [email or web address] Goal status: INITIAL  2.  Patient to negotiate 8 stairs with most appropriate pattern Baseline: TBD Goal status: INITIAL   LONG TERM GOALS: Target date: 12/24/2023  Patient will acknowledge 4/10 pain at least once during  episode of care   Baseline: 8 Goal status: INITIAL  2.  Patient will increase 30s chair stand reps from 0 to 6 with/without arms to demonstrate and improved functional ability with less pain/difficulty as well as reduce fall risk.  Baseline: 0 Goal status: INITIAL  3.  Patient will score at least 6/80 on FOTO to signify clinically meaningful improvement in functional abilities.   Baseline: 40/80 Goal status: INITIAL  4.  Increase knee ROM to 0d extension, 115d flexion Baseline:  A/PROM Right eval Left eval  Hip flexion    Hip extension    Hip abduction    Hip adduction    Hip internal rotation    Hip external rotation    Knee flexion  70/85d  Knee extension  /-3d   Goal status: INITIAL  5.  Increase L knee strength to 4/5 Baseline:  MMT Right eval Left eval  Hip flexion    Hip extension    Hip abduction    Hip adduction    Hip internal rotation    Hip external rotation    Knee flexion  3  Knee extension  3-   Goal status: INITIAL  6.  Re-assess 2 MWT to determine progress towards functional mobility Baseline:  Goal status: INITIAL   PLAN:  PT FREQUENCY: 2x/week  PT DURATION: 8 weeks  PLANNED INTERVENTIONS: 97110-Therapeutic exercises, 97530- Therapeutic activity, [id number]- Neuromuscular re-education, 97535- Self Care,  [id number]- Manual therapy, [phone]- Gait training, Patient/Family education, Balance training, and Stair training  PLAN FOR NEXT SESSION: HEP review and update, manual techniques as appropriate, aerobic tasks, ROM and flexibility activities, strengthening and PREs, TPDN, gait and balance training as needed    For all possible CPT codes, reference the Planned Interventions line above.     Check all conditions that are expected to impact treatment: {Conditions expected to impact treatment:Diabetes mellitus, Musculoskeletal disorders, and Complications related to surgery   Albesa Huguenin, PT, DPT 11/24/2023, 11:20 AM

## 2023-11-25 ENCOUNTER — Other Ambulatory Visit: Payer: Self-pay | Admitting: Physician Assistant

## 2023-11-25 ENCOUNTER — Encounter: Payer: Self-pay | Admitting: Orthopaedic Surgery

## 2023-11-25 ENCOUNTER — Other Ambulatory Visit (HOSPITAL_COMMUNITY): Payer: Self-pay

## 2023-11-25 ENCOUNTER — Other Ambulatory Visit: Payer: Self-pay

## 2023-11-25 ENCOUNTER — Telehealth: Payer: Self-pay | Admitting: Orthopaedic Surgery

## 2023-11-25 NOTE — Telephone Encounter (Signed)
 Patient called and wants to know if he can get a refill on pain medication. CB#[phone]

## 2023-11-25 NOTE — Telephone Encounter (Signed)
 Correct.  Oxy was denied bc we have to wean to norco which was sent in on 6/10

## 2023-11-25 NOTE — Telephone Encounter (Signed)
 Please see previous messages

## 2023-11-26 ENCOUNTER — Ambulatory Visit

## 2023-11-26 DIAGNOSIS — R2689 Other abnormalities of gait and mobility: Secondary | ICD-10-CM | POA: Diagnosis not present

## 2023-11-26 DIAGNOSIS — Z96651 Presence of right artificial knee joint: Secondary | ICD-10-CM | POA: Diagnosis not present

## 2023-11-26 DIAGNOSIS — M25561 Pain in right knee: Secondary | ICD-10-CM | POA: Diagnosis not present

## 2023-11-27 ENCOUNTER — Other Ambulatory Visit (HOSPITAL_COMMUNITY): Payer: Self-pay

## 2023-12-01 ENCOUNTER — Other Ambulatory Visit (HOSPITAL_COMMUNITY): Payer: Self-pay

## 2023-12-01 ENCOUNTER — Telehealth: Payer: Self-pay

## 2023-12-01 ENCOUNTER — Ambulatory Visit (INDEPENDENT_AMBULATORY_CARE_PROVIDER_SITE_OTHER): Admitting: Physician Assistant

## 2023-12-01 DIAGNOSIS — M1711 Unilateral primary osteoarthritis, right knee: Secondary | ICD-10-CM | POA: Diagnosis not present

## 2023-12-01 MED ORDER — BUPIVACAINE HCL 0.25 % IJ SOLN
2.0000 mL | INTRAMUSCULAR | Status: AC | PRN
  Administered 2023-12-01: 2 mL via INTRA_ARTICULAR

## 2023-12-01 MED ORDER — HYDROCODONE-ACETAMINOPHEN 5-325 MG PO TABS
1.0000 | ORAL_TABLET | Freq: Three times a day (TID) | ORAL | 0 refills | Status: DC | PRN
  Filled 2023-12-01 (×2): qty 21, 7d supply, fill #0

## 2023-12-01 MED ORDER — LIDOCAINE HCL 1 % IJ SOLN
2.0000 mL | INTRAMUSCULAR | Status: AC | PRN
  Administered 2023-12-01: 2 mL

## 2023-12-01 MED ORDER — KETOROLAC TROMETHAMINE 30 MG/ML IJ SOLN: 30.0000 mg | Freq: Once | INTRAMUSCULAR | Status: AC

## 2023-12-01 NOTE — Telephone Encounter (Signed)
 [facility] called stating they got the Norco Rx and they only fill patients Rx's that are discharging from hospital, so they can't fill that.

## 2023-12-01 NOTE — Progress Notes (Signed)
 Office Visit Note   Patient: Justin Burnett           Date of Birth: 1966-01-28           MRN: [id number] Visit Date: 12/01/2023              Requested by: [name], NP [address] [place],  [place] [address] PCP: [name], NP   Assessment & Plan: Visit Diagnoses:  1. Unilateral primary osteoarthritis, right knee     Plan: Impression is right knee osteoarthritis flare.  Today, we discussed aspirating the right knee and injecting it with Toradol .  He will follow-up with us  as needed for his right knee.  Follow-Up Instructions: Return in about 1 week (around 12/08/2023) for for left knee.   Orders:  Orders Placed This Encounter  Procedures   Large Joint Inj: R knee   Meds ordered this encounter  Medications   ketorolac  (TORADOL ) 30 MG/ML injection 30 mg   HYDROcodone -acetaminophen  (NORCO/VICODIN) 5-325 MG tablet    Sig: Take 1 tablet by mouth 3 (three) times daily as needed for moderate pain (pain score 4-6).    Dispense:  21 tablet    Refill:  0      Procedures: Large Joint Inj: R knee on 12/01/2023 3:21 PM Indications: pain Details: 22 G needle, anterolateral approach Medications: 2 mL lidocaine  1 %; 2 mL bupivacaine  0.25 %      Clinical Data: No additional findings.   Subjective: Chief Complaint  Patient presents with   Right Knee - Pain    HPI patient is a pleasant 58 year old gentleman who comes in today with right knee pain.  History of osteoarthritis.  He has recently undergone left total knee replacement and has had to rely more on the right knee.  Pain he has is to the medial knee worse with activity.  He underwent cortisone injection in January 2025 with good but temporary relief.  He is planning on scheduling a right total knee replacement in the near future.  Review of Systems as detailed in HPI.  All others reviewed and are negative.   Objective: Vital Signs: There were no vitals taken for this visit.  Physical Exam  well-developed well-nourished gentleman in no acute distress.  Alert and oriented x 3.  Ortho Exam right knee exam: Moderate effusion.  Range of motion 0 to 120 degrees.  Medial joint line tenderness.  He is neurovascularly intact distally.  Specialty Comments:  No specialty comments available.  Imaging: No new imaging   PMFS History: Patient Active Problem List   Diagnosis Date Noted   Status post total left knee replacement 10/26/2023   Preop cardiovascular exam 08/11/2023   Obesity (BMI 30.0-34.9) 08/11/2023   Cardiac murmur 08/11/2023   Cigarette smoker 08/11/2023   Prominent abdominal aortic pulsation 08/11/2023   Hypertension    Nerve damage    Neuromuscular disorder (HCC)    Abnormal EKG    Osteoarthritis of knees, bilateral 06/13/2022   Paresthesia 03/13/2022   Chronic bilateral low back pain without sciatica 03/13/2022   Hyperlipidemia 10/17/2021   Prediabetes 10/17/2021   Essential (primary) hypertension 09/26/2021   Neuropathy    Osteoarthritis 11/03/2011   Past Medical History:  Diagnosis Date   Abnormal EKG    Chronic bilateral low back pain without sciatica 03/13/2022   Essential (primary) hypertension 09/26/2021   Hyperlipidemia 10/17/2021   Hypertension    Nerve damage    Neuropathy    Osteoarthritis 11/03/2011  Osteoarthritis of knees, bilateral 06/13/2022   Paresthesia 03/13/2022   Prediabetes 10/17/2021    Family History  Problem Relation Age of Onset   Colon cancer Neg Hx    Esophageal cancer Neg Hx    Rectal cancer Neg Hx    Stomach cancer Neg Hx    Colon polyps Neg Hx     Past Surgical History:  Procedure Laterality Date   FINGER SURGERY Left    As a child. Tip of middle finger cut off in wood shop   TOTAL KNEE ARTHROPLASTY Left 10/26/2023   Procedure: ARTHROPLASTY, KNEE, TOTAL;  Surgeon: Wes Hamman, MD;  Location: [facility] OR;  Service: Orthopedics;  Laterality: Left;   Social History   Occupational History   Not on file  Tobacco  Use   Smoking status: Every Day    Current packs/day: 0.50    Average packs/day: 0.5 packs/day for 13.0 years (6.5 ttl pk-yrs)    Types: Cigarettes    Passive exposure: Current   Smokeless tobacco: Never  Vaping Use   Vaping status: Never Used  Substance and Sexual Activity   Alcohol use: Not Currently    Alcohol/week: 3.0 standard drinks of alcohol    Types: 3 Cans of beer per week   Drug use: No   Sexual activity: Not Currently

## 2023-12-02 ENCOUNTER — Other Ambulatory Visit (HOSPITAL_COMMUNITY): Payer: Self-pay

## 2023-12-02 ENCOUNTER — Other Ambulatory Visit: Payer: Self-pay | Admitting: Physician Assistant

## 2023-12-02 MED ORDER — HYDROCODONE-ACETAMINOPHEN 5-325 MG PO TABS
1.0000 | ORAL_TABLET | Freq: Three times a day (TID) | ORAL | 0 refills | Status: DC | PRN
  Filled 2023-12-02: qty 21, 7d supply, fill #0

## 2023-12-02 NOTE — Telephone Encounter (Signed)
 resent

## 2023-12-08 ENCOUNTER — Other Ambulatory Visit: Payer: Self-pay | Admitting: Physician Assistant

## 2023-12-08 ENCOUNTER — Telehealth: Payer: Self-pay | Admitting: Orthopaedic Surgery

## 2023-12-08 ENCOUNTER — Telehealth: Payer: Self-pay | Admitting: Physician Assistant

## 2023-12-08 ENCOUNTER — Other Ambulatory Visit (HOSPITAL_COMMUNITY): Payer: Self-pay

## 2023-12-08 MED ORDER — HYDROCODONE-ACETAMINOPHEN 5-325 MG PO TABS
1.0000 | ORAL_TABLET | Freq: Two times a day (BID) | ORAL | 0 refills | Status: DC | PRN
  Filled 2023-12-08: qty 20, 10d supply, fill #0

## 2023-12-08 NOTE — Telephone Encounter (Signed)
 Sent, but decreased to bid

## 2023-12-08 NOTE — Telephone Encounter (Signed)
 Patient called. Says he is still having pain and only has 2 hydrocodone  left. Would like a refill.

## 2023-12-09 ENCOUNTER — Encounter

## 2023-12-09 ENCOUNTER — Telehealth: Payer: Self-pay

## 2023-12-09 NOTE — Telephone Encounter (Signed)
Mutiple attempts made to complete PV. Unable to reach patient. VM left. Pt to call the office back by 5 PM to have PV rescheduled. Pt made aware that in the event that we do not hear back from them their PV and scheduled procedure will be cancelled.  

## 2023-12-10 ENCOUNTER — Ambulatory Visit

## 2023-12-10 VITALS — Ht 72.0 in | Wt 245.0 lb

## 2023-12-10 DIAGNOSIS — Z1211 Encounter for screening for malignant neoplasm of colon: Secondary | ICD-10-CM

## 2023-12-10 NOTE — Progress Notes (Signed)
 Pre visit completed over telephone.  Instructions forwarded through MyChart, Patient had Golytely from previously cancelled colonoscopy.    No egg or soy allergy known to patient  No issues known to pt with past sedation with any surgeries or procedures Patient denies ever being told they had issues or difficulty with intubation  No FH of Malignant Hyperthermia Pt is not on diet pills Pt is not on  home 02  Pt is not on blood thinners  Pt denies issues with constipation  No A fib or A flutter Have any cardiac testing pending-- NO Pt instructed to use [email or web address] or GoodRx for a price reduction on prep

## 2023-12-11 ENCOUNTER — Telehealth: Payer: Self-pay | Admitting: Orthopaedic Surgery

## 2023-12-11 NOTE — Telephone Encounter (Signed)
 Patient aware of the below message

## 2023-12-11 NOTE — Telephone Encounter (Signed)
 Patient called. Would like to know can he start back meloxicam ?

## 2023-12-11 NOTE — Telephone Encounter (Signed)
 Should have finished blood thinners this week and therefor should be able to start back now

## 2023-12-15 ENCOUNTER — Ambulatory Visit: Admitting: Orthopaedic Surgery

## 2023-12-15 ENCOUNTER — Other Ambulatory Visit (HOSPITAL_COMMUNITY): Payer: Self-pay

## 2023-12-15 ENCOUNTER — Other Ambulatory Visit (INDEPENDENT_AMBULATORY_CARE_PROVIDER_SITE_OTHER): Payer: Self-pay

## 2023-12-15 DIAGNOSIS — M1711 Unilateral primary osteoarthritis, right knee: Secondary | ICD-10-CM | POA: Diagnosis not present

## 2023-12-15 DIAGNOSIS — Z96652 Presence of left artificial knee joint: Secondary | ICD-10-CM

## 2023-12-15 MED ORDER — TRAMADOL HCL 50 MG PO TABS
50.0000 mg | ORAL_TABLET | Freq: Every day | ORAL | 0 refills | Status: DC | PRN
  Filled 2023-12-15: qty 10, 5d supply, fill #0
  Filled 2024-01-14: qty 10, 5d supply, fill #1

## 2023-12-15 MED ORDER — MELOXICAM 7.5 MG PO TABS
7.5000 mg | ORAL_TABLET | Freq: Two times a day (BID) | ORAL | 2 refills | Status: DC | PRN
  Filled 2023-12-15: qty 60, 30d supply, fill #0
  Filled 2024-01-14: qty 60, 30d supply, fill #1
  Filled 2024-02-26: qty 60, 30d supply, fill #2

## 2023-12-15 NOTE — Progress Notes (Signed)
 Office Visit Note   Patient: Justin Burnett           Date of Birth: 05-25-1966           MRN: [id number] Visit Date: 12/15/2023              Requested by: [name], NP [address] [place],  [place] [address] PCP: [name], NP   Assessment & Plan: Visit Diagnoses:  1. Status post total left knee replacement   2. Primary osteoarthritis of right knee     Plan: History of Present Illness Justin Burnett is a 58 year old male who presents for follow-up six weeks after left knee replacement surgery.  Six weeks after left knee replacement, he experiences floppiness and a popping sound in the knee, especially when walking. The knee remains swollen. He engages in minimal physical activity, limited to daily tasks and occasional grocery trips, and uses a copper fit sleeve for compression. He missed physical therapy sessions last week due to scheduling issues. He perceives the left leg as longer than the right.  The right knee received a Toradol  injection without pain relief. He takes meloxicam , which he finds beneficial, and requests a refill. He also inquires about a hydrocodone  refill. He continues to use a walker, primarily due to right knee pain, which limits mobility and affects his ability to return to work. He is concerned about finding a job that accommodates his physical limitations.  Physical Exam MUSCULOSKELETAL: Left knee joint effusion. Left leg swelling present. Left knee collaterals stable.  Results RADIOLOGY Left knee X-ray: Normal alignment, no signs of malalignment (12/15/2023)  Assessment and Plan Left knee replacement Six weeks post-surgery with good alignment and leg straightening on x-rays.  Popping sensation likely due to joint effusion causing patellofemoral symptoms.  Full recovery needed before right knee replacement. - Encourage continued physical therapy.  Right knee pain Chronic pain worsened by walker use. Previous Toradol   ineffective. Cortisone injection considered but delays surgery by 90 days. - Continue meloxicam . - Prescribe tramadol. - Discuss cortisone injection with understanding of surgery delay.  Use of walker due to knee issues Walker use due to right knee pain. Transition off walker requires full recovery from left knee replacement and right knee pain management. - Encourage continued walker use for stability. - Plan right knee replacement surgery at least three months post left knee replacement, contingent on recovery.  Follow-Up Instructions: Return in about 6 weeks (around 01/26/2024) for with lindsey.   Orders:  Orders Placed This Encounter  Procedures   XR Knee 1-2 Views Left   Meds ordered this encounter  Medications   meloxicam  (MOBIC ) 7.5 MG tablet    Sig: Take 1 tablet (7.5 mg total) by mouth 2 (two) times daily as needed for pain.    Dispense:  60 tablet    Refill:  2   traMADol (ULTRAM) 50 MG tablet    Sig: Take 1-2 tablets (50-100 mg total) by mouth daily as needed.    Dispense:  20 tablet    Refill:  0      Procedures: No procedures performed   Clinical Data: No additional findings.   Subjective: Chief Complaint  Patient presents with   Left Knee - Follow-up    Left TKA 10/26/2023    HPI  Review of Systems  Constitutional: Negative.   HENT: Negative.    Eyes: Negative.   Respiratory: Negative.    Cardiovascular: Negative.   Gastrointestinal:  Negative.   Endocrine: Negative.   Genitourinary: Negative.   Skin: Negative.   Allergic/Immunologic: Negative.   Neurological: Negative.   Hematological: Negative.   Psychiatric/Behavioral: Negative.    All other systems reviewed and are negative.    Objective: Vital Signs: There were no vitals taken for this visit.  Physical Exam Vitals and nursing note reviewed.  Constitutional:      Appearance: He is well-developed.  HENT:     Head: Normocephalic and atraumatic.   Eyes:     Pupils: Pupils are  equal, round, and reactive to light.   Pulmonary:     Effort: Pulmonary effort is normal.  Abdominal:     Palpations: Abdomen is soft.   Musculoskeletal:        General: Normal range of motion.     Cervical back: Neck supple.   Skin:    General: Skin is warm.   Neurological:     Mental Status: He is alert and oriented to person, place, and time.   Psychiatric:        Behavior: Behavior normal.        Thought Content: Thought content normal.        Judgment: Judgment normal.     Ortho Exam  Specialty Comments:  No specialty comments available.  Imaging: XR Knee 1-2 Views Left Result Date: 12/15/2023 X-rays of the left knee show stable left total knee arthroplasty.  Advanced arthritis of the right knee with bone-on-bone joint space narrowing of the medial compartment with varus deformity.    PMFS History: Patient Active Problem List   Diagnosis Date Noted   Status post total left knee replacement 10/26/2023   Preop cardiovascular exam 08/11/2023   Obesity (BMI 30.0-34.9) 08/11/2023   Cardiac murmur 08/11/2023   Cigarette smoker 08/11/2023   Prominent abdominal aortic pulsation 08/11/2023   Hypertension    Nerve damage    Neuromuscular disorder (HCC)    Abnormal EKG    Osteoarthritis of knees, bilateral 06/13/2022   Paresthesia 03/13/2022   Chronic bilateral low back pain without sciatica 03/13/2022   Hyperlipidemia 10/17/2021   Prediabetes 10/17/2021   Essential (primary) hypertension 09/26/2021   Neuropathy    Osteoarthritis 11/03/2011   Past Medical History:  Diagnosis Date   Abnormal EKG    Chronic bilateral low back pain without sciatica 03/13/2022   Essential (primary) hypertension 09/26/2021   Hyperlipidemia 10/17/2021   Hypertension    Nerve damage    Neuropathy    Osteoarthritis 11/03/2011   Osteoarthritis of knees, bilateral 06/13/2022   Paresthesia 03/13/2022   Prediabetes 10/17/2021    Family History  Problem Relation Age of Onset    Colon cancer Neg Hx    Esophageal cancer Neg Hx    Rectal cancer Neg Hx    Stomach cancer Neg Hx    Colon polyps Neg Hx     Past Surgical History:  Procedure Laterality Date   FINGER SURGERY Left    As a child. Tip of middle finger cut off in wood shop   TOTAL KNEE ARTHROPLASTY Left 10/26/2023   Procedure: ARTHROPLASTY, KNEE, TOTAL;  Surgeon: Jerri Kay HERO, MD;  Location: [facility] OR;  Service: Orthopedics;  Laterality: Left;   Social History   Occupational History   Not on file  Tobacco Use   Smoking status: Every Day    Current packs/day: 0.50    Average packs/day: 0.5 packs/day for 13.0 years (6.5 ttl pk-yrs)    Types: Cigarettes    Passive exposure: Current  Smokeless tobacco: Never  Vaping Use   Vaping status: Never Used  Substance and Sexual Activity   Alcohol use: Not Currently    Alcohol/week: 3.0 standard drinks of alcohol    Types: 3 Cans of beer per week    Comment: 1 pint liquer per week   Drug use: No   Sexual activity: Not Currently

## 2023-12-21 ENCOUNTER — Other Ambulatory Visit (HOSPITAL_COMMUNITY): Payer: Self-pay

## 2023-12-22 NOTE — Therapy (Signed)
 OUTPATIENT PHYSICAL THERAPY TREATMENT NOTE   Patient Name: Justin Burnett MRN: [id number] DOB:03/21/1966, 58 y.o., male Today's Date: 12/25/2023  END OF SESSION:  PT End of Session - 12/25/23 0825     Visit Number 8    Number of Visits 16    Date for PT Re-Evaluation 12/30/23    Authorization Type MCD    Authorization Time Period Approved 13 visits 10/30/23-01/28/24    Authorization - Visit Number 8    Authorization - Number of Visits 13    PT Start Time 0830    PT Stop Time 0910    PT Time Calculation (min) 40 min    Activity Tolerance Patient tolerated treatment well    Behavior During Therapy [facility] for tasks assessed/performed               Past Medical History:  Diagnosis Date   Abnormal EKG    Chronic bilateral low back pain without sciatica 03/13/2022   Essential (primary) hypertension 09/26/2021   Hyperlipidemia 10/17/2021   Hypertension    Nerve damage    Neuropathy    Osteoarthritis 11/03/2011   Osteoarthritis of knees, bilateral 06/13/2022   Paresthesia 03/13/2022   Prediabetes 10/17/2021   Past Surgical History:  Procedure Laterality Date   FINGER SURGERY Left    As a child. Tip of middle finger cut off in wood shop   TOTAL KNEE ARTHROPLASTY Left 10/26/2023   Procedure: ARTHROPLASTY, KNEE, TOTAL;  Surgeon: Jerri Kay HERO, MD;  Location: [facility] OR;  Service: Orthopedics;  Laterality: Left;   Patient Active Problem List   Diagnosis Date Noted   Status post total left knee replacement 10/26/2023   Preop cardiovascular exam 08/11/2023   Obesity (BMI 30.0-34.9) 08/11/2023   Cardiac murmur 08/11/2023   Cigarette smoker 08/11/2023   Prominent abdominal aortic pulsation 08/11/2023   Hypertension    Nerve damage    Neuromuscular disorder (HCC)    Abnormal EKG    Osteoarthritis of knees, bilateral 06/13/2022   Paresthesia 03/13/2022   Chronic bilateral low back pain without sciatica 03/13/2022   Hyperlipidemia 10/17/2021   Prediabetes 10/17/2021    Essential (primary) hypertension 09/26/2021   Neuropathy    Osteoarthritis 11/03/2011    PCP: [name], NP   REFERRING PROVIDER: [name]  REFERRING DIAG: [phone] (ICD-10-CM) - Hx of total knee replacement, left 10/26/2023  THERAPY DIAG:  Acute pain of right knee  Other abnormalities of gait and mobility  History of total right knee replacement  Rationale for Evaluation and Treatment: Rehabilitation  ONSET DATE: 10/26/23 DOS  SUBJECTIVE:   SUBJECTIVE STATEMENT: L knee function improved.  Relates minimal symptoms in L knee just an occasional episode of knee buckling.   PERTINENT HISTORY: Damiean is a very pleasant 58 year old gentleman with end-stage varus DJD of both knees. Treatment options were discussed again. We agreed to do a right knee cortisone injection with plans to do a left total knee arthroplasty in the near future. He will call [name] to schedule surgery. Risk benefits prognosis reviewed.  PAIN:  Are you having pain? Yes: NPRS scale: 4-8/10 Pain location: L knee Pain description: ache, sore, throb Aggravating factors: activity Relieving factors: rest ice  PRECAUTIONS: Fall  RED FLAGS: None   WEIGHT BEARING RESTRICTIONS: No  FALLS:  Has patient fallen in last 6 months? No  OCCUPATION: not working  PLOF: Independent  PATIENT GOALS: To get my knee working again  NEXT MD VISIT: 12/09/2023  OBJECTIVE:  Note: Objective measures  were completed at Evaluation unless otherwise noted.  DIAGNOSTIC FINDINGS: none avaialble  PATIENT SURVEYS:  LEFS 6/80  MUSCLE LENGTH: Not tested  POSTURE: R knee varus  PALPATION: deferred  LOWER EXTREMITY ROM:  A/PROM Right eval Left eval L 12/25/23  Hip flexion     Hip extension     Hip abduction     Hip adduction     Hip internal rotation     Hip external rotation     Knee flexion  70/85d 120d A  Knee extension  /-3d -25dA/-3dP  Ankle dorsiflexion     Ankle plantarflexion      Ankle inversion     Ankle eversion      (Blank rows = not tested)  LOWER EXTREMITY MMT:  MMT Right eval Left eval L 12/25/23  Hip flexion     Hip extension     Hip abduction     Hip adduction     Hip internal rotation     Hip external rotation     Knee flexion  3 4-  Knee extension  3- 4-  Ankle dorsiflexion     Ankle plantarflexion     Ankle inversion     Ankle eversion      (Blank rows = not tested)  LOWER EXTREMITY SPECIAL TESTS:  deferred  FUNCTIONAL TESTS:  30 seconds chair stand test 0 with RW; 12/25/23 7 w/UE assist 2 MWT 155ft  GAIT: Distance walked: 57ft x2 Assistive device utilized: Environmental consultant - 2 wheeled Level of assistance: Complete Independence Comments: antalgic gait                                                                                                                                TREATMENT: [facility] Adult PT Treatment:                                                DATE: 12/25/23 Therapeutic Exercise: Nustep L4 8 min 60 SPM Neuromuscular re-ed: Runners step 4 in 10x L FAQs with adduction 15x2 16 steps with B rails and step to pattern to accommodate R knee pain Standing TKE BlaTB 15x Therapeutic Activity: Supine hip fallouts BlaTB 15x B, 15/15 unilaterally SAQs 5# 15x2 S/L L clams BlaTB15x Heel slide 120d flexion AROM  [facility] Adult PT Treatment:                                                DATE: 11/26/23 Therapeutic Exercise: Nustep L4 8 min 40 SPM Neuromuscular re-ed: Runners step 4 in 10x L Sidestepping at counter 2 trips against yellow band Standing TKE BluTB 15x Therapeutic Activity: Supine hip fallouts BluTB 15x B, 15/15 unilaterally SAQs  5# 15x2 Seated hamstring curls BluTB 15x Hhel slides with strap 15x 113d  [facility] Adult PT Treatment:                                                DATE: 11/19/23 Neuromuscular re-ed: QS 3s hold 15x SAQ 3# 15x Supine hip fallouts BluTB 15x B, 15x L FAQ with adduction Therapeutic Activity: Heel  slides w/strap 15x 110 SLR 15x Standing TKE BluTB 15x Nustep L4 8 min  [facility] Adult PT Treatment:                                                DATE: 11/17/2023  Therapeutic Exercise: Recumbent bike 8' PROM in L knee flexion/ext Neuromuscular re-ed: Supine SLR with quad set 2x12, hold 5s up, 5s down Supine heel slide with strap and hamstring set 1x20, hold 5s  [facility] Adult PT Treatment:                                                DATE: 11/11/23 Therapeutic Exercise: Nustep L2 6 min Neuromuscular re-ed: SAQ 15x2 FAQs w/adduction 15x Therapeutic Activity: Heel slides w/strap 15x2 95d flexion Heel/toe raises Supine hip fallouts GTB 15xB, 15/15 unilaterally Seated hamstring curls GTB 15x2 [facility] Adult PT Treatment:   (30m late)                   DATE: 11/05/2023 Therapeutic Exercise: PROM into L knee flexion/ext (0-90) Neuromuscular re-ed: Quad set 2x8, hold 8s Supine SLR 2x8, hold 3s   [facility] Adult PT Treatment:                                                DATE: 10/30/23 Eval and HEP Self Care: Additional minutes spent for educating on updated Therapeutic Home Exercise Program as well as comparing current status to condition at start of symptoms. This included exercises focusing on stretching, strengthening, with focus on eccentric aspects. Long term goals include an improvement in range of motion, strength, endurance as well as avoiding reinjury. Patient's frequency would include in 1-2 times a day, 3-5 times a week for a duration of 6-12 weeks. Proper technique shown and discussed handout in great detail. All questions were discussed and addressed.      PATIENT EDUCATION:  Education details: Discussed eval findings, rehab rationale and POC and patient is in agreement  Person educated: Patient Education method: Explanation Education comprehension: verbalized understanding and needs further education  HOME EXERCISE PROGRAM: Access Code: [email or web address] URL:  [email or web address] Date: 10/30/2023 Prepared by: Reyes Kohut  Exercises - Supine Quad Set  - 5 x daily - 5 x weekly - 1 sets - 10 reps - 3s hold - Supine Heel Slide with Strap  - 5 x daily - 5 x weekly - 1 sets - 10 reps - Seated Heel Slide  - 5 x daily - 5 x weekly - 1 sets - 10 reps - 3s hold - Heel Toe  Raises with Counter Support  - 5 x daily - 5 x weekly - 1 sets - 10 reps  ASSESSMENT:  CLINICAL IMPRESSION: Patient returns to [facility] following 4 week absence.  Has weaned off AD but continues to demo antalgic gait due to R knee issues and need of R TKA.  Focus of session was TKE with treatment options limited due to R knee end stage arthritic changes.  Knee flexion goal met, continued TKE strength deficits.  Assessed stair negotiation with R knee main limiting factor.  Goal progress assessed.  Patient would benefit from continued PT to resolve extensor lag L.    Eval impression: Patient is a 58 y.o. male who was seen today for physical therapy evaluation and treatment s/p R TKA on 10/26/23. Patient ambulating with step through pattern using RW, requires assist to bring LLE onto bed.  Mild ROM restrictions in extension, 85d flexion following stretch.  Pain levels controlled with meds and ice compression, able to generate a fair quad set.   OBJECTIVE IMPAIRMENTS: Abnormal gait, decreased activity tolerance, decreased endurance, decreased mobility, difficulty walking, decreased ROM, decreased strength, and pain.   ACTIVITY LIMITATIONS: carrying, lifting, sitting, standing, squatting, stairs, and transfers  PERSONAL FACTORS: Age, Fitness, and Past/current experiences are also affecting patient's functional outcome.   REHAB POTENTIAL: Good  CLINICAL DECISION MAKING: Stable/uncomplicated  EVALUATION COMPLEXITY: Low   GOALS: Goals reviewed with patient? No  SHORT TERM GOALS: Target date: 11/26/2023   Patient to demonstrate independence in HEP  Baseline: [email or web address] Goal  status: Met  2.  Patient to negotiate 8 stairs with most appropriate pattern Baseline: TBD Goal status: INITIAL   LONG TERM GOALS: Target date: 12/24/2023  Patient will acknowledge 4/10 pain at least once during episode of care   Baseline: 8; 12/25/23 <7/10 Goal status: Met  2.  Patient will increase 30s chair stand reps from 0 to 6 with/without arms to demonstrate and improved functional ability with less pain/difficulty as well as reduce fall risk.  Baseline: 0; 12/25/23 7 w/UE support Goal status: Met  3.  Patient will score at least 6/80 on FOTO to signify clinically meaningful improvement in functional abilities.   Baseline: 40/80 Goal status: Ongoing  4.  Increase knee ROM to 0d extension, 115d flexion Baseline:  A/PROM Right eval Left eval L 12/25/23  Hip flexion     Hip extension     Hip abduction     Hip adduction     Hip internal rotation     Hip external rotation     Knee flexion  70/85d 120d A  Knee extension  /-3d -25dA/-3dP   Goal status: Ongoing  5.  Increase L knee strength to 4/5 Baseline:  MMT Right eval Left eval L 12/25/23  Hip flexion     Hip extension     Hip abduction     Hip adduction     Hip internal rotation     Hip external rotation     Knee flexion  3 4-  Knee extension  3- 4-   Goal status: Ongoing  6.  Re-assess 2 MWT to determine progress towards functional mobility Baseline: 150 ft Goal status: Ongoing   PLAN:  PT FREQUENCY: 2x/week  PT DURATION: 8 weeks  PLANNED INTERVENTIONS: 97110-Therapeutic exercises, 97530- Therapeutic activity, [id number]- Neuromuscular re-education, 97535- Self Care, [id number]- Manual therapy, [phone]- Gait training, Patient/Family education, Balance training, and Stair training  PLAN FOR NEXT SESSION: HEP review and update, manual techniques as appropriate, aerobic tasks, ROM  and flexibility activities, strengthening and PREs, TPDN, gait and balance training as needed    For all possible CPT codes,  reference the Planned Interventions line above.     Check all conditions that are expected to impact treatment: {Conditions expected to impact treatment:Diabetes mellitus, Musculoskeletal disorders, and Complications related to surgery   If treatment provided at initial evaluation, no treatment charged due to lack of authorization.       Jeff Simrah Chatham PT  12/25/2023, 9:17 AM

## 2023-12-25 ENCOUNTER — Ambulatory Visit: Attending: Physician Assistant

## 2023-12-25 DIAGNOSIS — Z96651 Presence of right artificial knee joint: Secondary | ICD-10-CM | POA: Insufficient documentation

## 2023-12-25 DIAGNOSIS — R2689 Other abnormalities of gait and mobility: Secondary | ICD-10-CM | POA: Insufficient documentation

## 2023-12-25 DIAGNOSIS — M25561 Pain in right knee: Secondary | ICD-10-CM | POA: Diagnosis not present

## 2023-12-25 DIAGNOSIS — M25562 Pain in left knee: Secondary | ICD-10-CM | POA: Diagnosis not present

## 2023-12-25 DIAGNOSIS — Z96652 Presence of left artificial knee joint: Secondary | ICD-10-CM | POA: Insufficient documentation

## 2023-12-28 ENCOUNTER — Ambulatory Visit: Admitting: Internal Medicine

## 2023-12-28 ENCOUNTER — Encounter: Payer: Self-pay | Admitting: Internal Medicine

## 2023-12-28 VITALS — BP 131/84 | HR 67 | Temp 98.0°F | Resp 14 | Ht 72.0 in | Wt 245.0 lb

## 2023-12-28 DIAGNOSIS — Z1211 Encounter for screening for malignant neoplasm of colon: Secondary | ICD-10-CM

## 2023-12-28 DIAGNOSIS — R195 Other fecal abnormalities: Secondary | ICD-10-CM

## 2023-12-28 DIAGNOSIS — D122 Benign neoplasm of ascending colon: Secondary | ICD-10-CM | POA: Diagnosis not present

## 2023-12-28 DIAGNOSIS — K648 Other hemorrhoids: Secondary | ICD-10-CM | POA: Diagnosis not present

## 2023-12-28 DIAGNOSIS — I10 Essential (primary) hypertension: Secondary | ICD-10-CM | POA: Diagnosis not present

## 2023-12-28 DIAGNOSIS — K529 Noninfective gastroenteritis and colitis, unspecified: Secondary | ICD-10-CM | POA: Diagnosis not present

## 2023-12-28 DIAGNOSIS — K5150 Left sided colitis without complications: Secondary | ICD-10-CM | POA: Diagnosis not present

## 2023-12-28 DIAGNOSIS — E785 Hyperlipidemia, unspecified: Secondary | ICD-10-CM | POA: Diagnosis not present

## 2023-12-28 DIAGNOSIS — R7303 Prediabetes: Secondary | ICD-10-CM | POA: Diagnosis not present

## 2023-12-28 MED ORDER — SODIUM CHLORIDE 0.9 % IV SOLN: 500.0000 mL | INTRAVENOUS | Status: AC

## 2023-12-28 NOTE — Progress Notes (Signed)
 Pt's states no medical or surgical changes since previsit or office visit.

## 2023-12-28 NOTE — Op Note (Signed)
 [facility] Patient Name: Justin Burnett Procedure Date: 12/28/2023 12:55 PM MRN: [id number] Endoscopist: Norleen SAILOR. Abran , MD, [id number] Age: 58 Referring MD:  Date of Birth: April 04, 1966 Gender: Male Account #: [id number] Procedure:                Colonoscopy with cold snare polypectomy x 2; with                            biopsies Indications:              Positive Cologuard test (2023) Medicines:                Monitored Anesthesia Care Procedure:                Pre-Anesthesia Assessment:                           - Prior to the procedure, a History and Physical                            was performed, and patient medications and                            allergies were reviewed. The patient's tolerance of                            previous anesthesia was also reviewed. The risks                            and benefits of the procedure and the sedation                            options and risks were discussed with the patient.                            All questions were answered, and informed consent                            was obtained. Prior Anticoagulants: The patient has                            taken no anticoagulant or antiplatelet agents. ASA                            Grade Assessment: II - A patient with mild systemic                            disease. After reviewing the risks and benefits,                            the patient was deemed in satisfactory condition to                            undergo the procedure.  After obtaining informed consent, the colonoscope                            was passed under direct vision. Throughout the                            procedure, the patient's blood pressure, pulse, and                            oxygen saturations were monitored continuously. The                            Olympus Scope [id number] was introduced through the                            anus and advanced to the the  cecum, identified by                            appendiceal orifice and ileocecal valve. The                            ileocecal valve, appendiceal orifice, and rectum                            were photographed. The quality of the bowel                            preparation was excellent. The colonoscopy was                            performed without difficulty. The patient tolerated                            the procedure well. The bowel preparation used was                            SUPREP via split dose instruction. Scope In: 1:34:48 PM Scope Out: 1:55:26 PM Scope Withdrawal Time: 0 hours 15 minutes 3 seconds  Total Procedure Duration: 0 hours 20 minutes 38 seconds  Findings:                 Two polyps were found in the ascending colon. The                            polyps were 2 to 3 mm in size. These polyps were                            removed with a cold snare. Resection and retrieval                            were complete.                           There was diffuse mild to moderate colitis  as                            manifested by erosions, erythema, edema, and                            friability involving the distal 35 to 40 cm of the                            colon, to the level of the anal verge. These                            changes were consistent with ulcerative colitis.                            See images. Multiple biopsies taken. The exam was                            otherwise without abnormality on direct and                            retroflexion views.                           Internal hemorrhoids were found during retroflexion. Complications:            No immediate complications. Estimated blood loss:                            None. Estimated Blood Loss:     Estimated blood loss: none. Impression:               1. Two 2 to 3 mm polyps in the ascending colon,                            removed with a cold snare. Resected and retrieved.                            2. Left-sided ulcerative colitis (distal 35 to 40                            cm). Moderate active disease.                           3. Internal hemorrhoids.                           4. Otherwise unremarkable exam Recommendation:           - Repeat colonoscopy in 5 years for surveillance.                           - Patient has a contact number available for                            emergencies. The signs and symptoms of potential  delayed complications were discussed with the                            patient. Return to normal activities tomorrow.                            Written discharge instructions were provided to the                            patient.                           - Resume previous diet.                           - Continue present medications.                           - Await pathology results.                           - Office follow-up with Dr. Abran in a few weeks to                            review the above findings and pathology and discuss                            potential treatment. Norleen SAILOR. Abran, MD 12/28/2023 2:07:02 PM This report has been signed electronically.

## 2023-12-28 NOTE — Patient Instructions (Signed)
 Please read handouts provided. Continue present medications. Await pathology results. Repeat colonoscopy in 5 years for screening. Office follow-up with Dr. Abran in a few weeks.   YOU HAD AN ENDOSCOPIC PROCEDURE TODAY AT THE [facility]:   Refer to the procedure report that was given to you for any specific questions about what was found during the examination.  If the procedure report does not answer your questions, please call your gastroenterologist to clarify.  If you requested that your care partner not be given the details of your procedure findings, then the procedure report has been included in a sealed envelope for you to review at your convenience later.  YOU SHOULD EXPECT: Some feelings of bloating in the abdomen. Passage of more gas than usual.  Walking can help get rid of the air that was put into your GI tract during the procedure and reduce the bloating. If you had a lower endoscopy (such as a colonoscopy or flexible sigmoidoscopy) you may notice spotting of blood in your stool or on the toilet paper. If you underwent a bowel prep for your procedure, you may not have a normal bowel movement for a few days.  Please Note:  You might notice some irritation and congestion in your nose or some drainage.  This is from the oxygen used during your procedure.  There is no need for concern and it should clear up in a day or so.  SYMPTOMS TO REPORT IMMEDIATELY:  Following lower endoscopy (colonoscopy or flexible sigmoidoscopy):  Excessive amounts of blood in the stool  Significant tenderness or worsening of abdominal pains  Swelling of the abdomen that is new, acute  Fever of 100F or higher.  For urgent or emergent issues, a gastroenterologist can be reached at any hour by calling [phone]. Do not use MyChart messaging for urgent concerns.    DIET:  We do recommend a small meal at first, but then you may proceed to your regular diet.  Drink plenty of fluids but you  should avoid alcoholic beverages for 24 hours.  ACTIVITY:  You should plan to take it easy for the rest of today and you should NOT DRIVE or use heavy machinery until tomorrow (because of the sedation medicines used during the test).    FOLLOW UP: Our staff will call the number listed on your records the next business day following your procedure.  We will call around 7:15- 8:00 am to check on you and address any questions or concerns that you may have regarding the information given to you following your procedure. If we do not reach you, we will leave a message.     If any biopsies were taken you will be contacted by phone or by letter within the next 1-3 weeks.  Please call us  at [phone] if you have not heard about the biopsies in 3 weeks.    SIGNATURES/CONFIDENTIALITY: You and/or your care partner have signed paperwork which will be entered into your electronic medical record.  These signatures attest to the fact that that the information above on your After Visit Summary has been reviewed and is understood.  Full responsibility of the confidentiality of this discharge information lies with you and/or your care-partner.

## 2023-12-28 NOTE — Progress Notes (Signed)
 HISTORY OF PRESENT ILLNESS:  Justin Burnett is a 58 y.o. male sent for colonoscopy after positive Cologuard testing May 2023.  REVIEW OF SYSTEMS:  All non-GI ROS negative except for  Past Medical History:  Diagnosis Date   Abnormal EKG    Chronic bilateral low back pain without sciatica 03/13/2022   Essential (primary) hypertension 09/26/2021   Hyperlipidemia 10/17/2021   Hypertension    Nerve damage    Neuropathy    Osteoarthritis 11/03/2011   Osteoarthritis of knees, bilateral 06/13/2022   Paresthesia 03/13/2022   Prediabetes 10/17/2021    Past Surgical History:  Procedure Laterality Date   FINGER SURGERY Left    As a child. Tip of middle finger cut off in wood shop   TOTAL KNEE ARTHROPLASTY Left 10/26/2023   Procedure: ARTHROPLASTY, KNEE, TOTAL;  Surgeon: Jerri Kay HERO, MD;  Location: [facility] OR;  Service: Orthopedics;  Laterality: Left;    Social History Justin Burnett  reports that he has been smoking cigarettes. He has a 6.5 pack-year smoking history. He has been exposed to tobacco smoke. He has never used smokeless tobacco. He reports that he does not currently use alcohol after a past usage of about 3.0 standard drinks of alcohol per week. He reports that he does not use drugs.  family history is not on file.  Allergies  Allergen Reactions   Meperidine Other (See Comments)    Causes seizure-like activities Demerol   Meperidine Hcl Other (See Comments)       PHYSICAL EXAMINATION: Vital signs: BP (!) 142/91   Pulse 76   Temp 98 F (36.7 C)   Ht 6' (1.829 m)   Wt 245 lb (111.1 kg)   SpO2 100%   BMI 33.23 kg/m  General: Well-developed, well-nourished, no acute distress HEENT: Sclerae are anicteric, conjunctiva pink. Oral mucosa intact Lungs: Clear Heart: Regular Abdomen: soft, nontender, nondistended, no obvious ascites, no peritoneal signs, normal bowel sounds. No organomegaly. Extremities: No edema Psychiatric: alert and oriented x3.  Cooperative     ASSESSMENT:  Positive Cologuard testing   PLAN:  Colonoscopy

## 2023-12-28 NOTE — Progress Notes (Signed)
 To pacu, VSS. Report to [email or web address]

## 2023-12-28 NOTE — Progress Notes (Signed)
 Called to room to assist during endoscopic procedure.  Patient ID and intended procedure confirmed with present staff. Received instructions for my participation in the procedure from the performing physician.

## 2023-12-29 ENCOUNTER — Telehealth: Payer: Self-pay | Admitting: *Deleted

## 2023-12-29 NOTE — Therapy (Unsigned)
 OUTPATIENT PHYSICAL THERAPY TREATMENT NOTE   Patient Name: Justin Burnett MRN: [id number] DOB:1965-10-13, 58 y.o., male Today's Date: 01/01/2024  END OF SESSION:  PT End of Session - 01/01/24 1105     Visit Number 9    Number of Visits 16    Date for PT Re-Evaluation 12/30/23    Authorization Type MCD    Authorization Time Period Approved 13 visits 10/30/23-01/28/24    Authorization - Visit Number 9    Authorization - Number of Visits 13    PT Start Time 1045    PT Stop Time 1125    PT Time Calculation (min) 40 min    Activity Tolerance Patient tolerated treatment well    Behavior During Therapy [facility] for tasks assessed/performed                Past Medical History:  Diagnosis Date   Abnormal EKG    Chronic bilateral low back pain without sciatica 03/13/2022   Essential (primary) hypertension 09/26/2021   Hyperlipidemia 10/17/2021   Hypertension    Nerve damage    Neuropathy    Osteoarthritis 11/03/2011   Osteoarthritis of knees, bilateral 06/13/2022   Paresthesia 03/13/2022   Prediabetes 10/17/2021   Past Surgical History:  Procedure Laterality Date   FINGER SURGERY Left    As a child. Tip of middle finger cut off in wood shop   TOTAL KNEE ARTHROPLASTY Left 10/26/2023   Procedure: ARTHROPLASTY, KNEE, TOTAL;  Surgeon: Jerri Kay HERO, MD;  Location: [facility] OR;  Service: Orthopedics;  Laterality: Left;   Patient Active Problem List   Diagnosis Date Noted   Status post total left knee replacement 10/26/2023   Preop cardiovascular exam 08/11/2023   Obesity (BMI 30.0-34.9) 08/11/2023   Cardiac murmur 08/11/2023   Cigarette smoker 08/11/2023   Prominent abdominal aortic pulsation 08/11/2023   Hypertension    Nerve damage    Neuromuscular disorder (HCC)    Abnormal EKG    Osteoarthritis of knees, bilateral 06/13/2022   Paresthesia 03/13/2022   Chronic bilateral low back pain without sciatica 03/13/2022   Hyperlipidemia 10/17/2021   Prediabetes 10/17/2021    Essential (primary) hypertension 09/26/2021   Neuropathy    Osteoarthritis 11/03/2011    PCP: [name], NP   REFERRING PROVIDER: [name]  REFERRING DIAG: [phone] (ICD-10-CM) - Hx of total knee replacement, left 10/26/2023  THERAPY DIAG:  Other abnormalities of gait and mobility  Acute pain of left knee  History of total left knee replacement  Rationale for Evaluation and Treatment: Rehabilitation  ONSET DATE: 10/26/23 DOS  SUBJECTIVE:   SUBJECTIVE STATEMENT: L knee function improved.  Relates minimal symptoms in L knee just an occasional episode of knee buckling.   PERTINENT HISTORY: Justin Burnett is a very pleasant 58 year old gentleman with end-stage varus DJD of both knees. Treatment options were discussed again. We agreed to do a right knee cortisone injection with plans to do a left total knee arthroplasty in the near future. He will call [name] to schedule surgery. Risk benefits prognosis reviewed.  PAIN:  Are you having pain? Yes: NPRS scale: 4-8/10 Pain location: L knee Pain description: ache, sore, throb Aggravating factors: activity Relieving factors: rest ice  PRECAUTIONS: Fall  RED FLAGS: None   WEIGHT BEARING RESTRICTIONS: No  FALLS:  Has patient fallen in last 6 months? No  OCCUPATION: not working  PLOF: Independent  PATIENT GOALS: To get my knee working again  NEXT MD VISIT: 12/09/2023  OBJECTIVE:  Note: Objective  measures were completed at Evaluation unless otherwise noted.  DIAGNOSTIC FINDINGS: none avaialble  PATIENT SURVEYS:  LEFS 6/80  MUSCLE LENGTH: Not tested  POSTURE: R knee varus  PALPATION: deferred  LOWER EXTREMITY ROM:  A/PROM Right eval Left eval L 12/25/23  Hip flexion     Hip extension     Hip abduction     Hip adduction     Hip internal rotation     Hip external rotation     Knee flexion  70/85d 120d A  Knee extension  /-3d -25dA/-3dP  Ankle dorsiflexion     Ankle plantarflexion     Ankle  inversion     Ankle eversion      (Blank rows = not tested)  LOWER EXTREMITY MMT:  MMT Right eval Left eval L 12/25/23  Hip flexion     Hip extension     Hip abduction     Hip adduction     Hip internal rotation     Hip external rotation     Knee flexion  3 4-  Knee extension  3- 4-  Ankle dorsiflexion     Ankle plantarflexion     Ankle inversion     Ankle eversion      (Blank rows = not tested)  LOWER EXTREMITY SPECIAL TESTS:  deferred  FUNCTIONAL TESTS:  30 seconds chair stand test 0 with RW; 12/25/23 7 w/UE assist 2 MWT 137ft  GAIT: Distance walked: 25ft x2 Assistive device utilized: Environmental consultant - 2 wheeled Level of assistance: Complete Independence Comments: antalgic gait                                                                                                                                TREATMENT: [facility] Adult PT Treatment:                                                DATE: 01/01/24 Therapeutic Exercise: Nustep 8 min (no resistance due to R knee pain) Neuromuscular re-ed: Bridge against BlaTB 15x S/L L clams BlaTB 15x Supine hip fallouts BlaTB 15x B, 15/15 unilaterally STS from airex pad 10x arms crossed Therapeutic Activity: FAQs L 5# 15x2 SAQs 5# 15x2 Standing hamstring curls 5# 15x Standing heel/toe 15x TKE BlaTB 15x  [facility] Adult PT Treatment:                                                DATE: 12/25/23 Therapeutic Exercise: Nustep L4 8 min 60 SPM Neuromuscular re-ed: Runners step 4 in 10x L FAQs with adduction 15x2 16 steps with B rails and step to pattern to accommodate R knee pain Standing TKE BlaTB 15x Therapeutic Activity:  Supine hip fallouts BlaTB 15x B, 15/15 unilaterally SAQs 5# 15x2 S/L L clams BlaTB15x Heel slide 120d flexion AROM  [facility] Adult PT Treatment:                                                DATE: 11/26/23 Therapeutic Exercise: Nustep L4 8 min 40 SPM Neuromuscular re-ed: Runners step 4 in 10x L Sidestepping at  counter 2 trips against yellow band Standing TKE BluTB 15x Therapeutic Activity: Supine hip fallouts BluTB 15x B, 15/15 unilaterally SAQs 5# 15x2 Seated hamstring curls BluTB 15x Hhel slides with strap 15x 113d    [facility] Adult PT Treatment:                                                DATE: 10/30/23 Eval and HEP Self Care: Additional minutes spent for educating on updated Therapeutic Home Exercise Program as well as comparing current status to condition at start of symptoms. This included exercises focusing on stretching, strengthening, with focus on eccentric aspects. Long term goals include an improvement in range of motion, strength, endurance as well as avoiding reinjury. Patient's frequency would include in 1-2 times a day, 3-5 times a week for a duration of 6-12 weeks. Proper technique shown and discussed handout in great detail. All questions were discussed and addressed.      PATIENT EDUCATION:  Education details: Discussed eval findings, rehab rationale and POC and patient is in agreement  Person educated: Patient Education method: Explanation Education comprehension: verbalized understanding and needs further education  HOME EXERCISE PROGRAM: Access Code: [email or web address] URL: [email or web address] Date: 10/30/2023 Prepared by: Reyes Kohut  Exercises - Supine Quad Set  - 5 x daily - 5 x weekly - 1 sets - 10 reps - 3s hold - Supine Heel Slide with Strap  - 5 x daily - 5 x weekly - 1 sets - 10 reps - Seated Heel Slide  - 5 x daily - 5 x weekly - 1 sets - 10 reps - 3s hold - Heel Toe Raises with Counter Support  - 5 x daily - 5 x weekly - 1 sets - 10 reps  ASSESSMENT:  CLINICAL IMPRESSION: Elevated R knee pain limiting activity tolerance today and session modified to accommodate.  Increased resistance as noted adding additional exercises to promote L knee extension.  Patient continues to demo an extensor lag with FAQ and SAQ tasks    Eval impression: Patient is  a 58 y.o. male who was seen today for physical therapy evaluation and treatment s/p R TKA on 10/26/23. Patient ambulating with step through pattern using RW, requires assist to bring LLE onto bed.  Mild ROM restrictions in extension, 85d flexion following stretch.  Pain levels controlled with meds and ice compression, able to generate a fair quad set.   OBJECTIVE IMPAIRMENTS: Abnormal gait, decreased activity tolerance, decreased endurance, decreased mobility, difficulty walking, decreased ROM, decreased strength, and pain.   ACTIVITY LIMITATIONS: carrying, lifting, sitting, standing, squatting, stairs, and transfers  PERSONAL FACTORS: Age, Fitness, and Past/current experiences are also affecting patient's functional outcome.   REHAB POTENTIAL: Good  CLINICAL DECISION MAKING: Stable/uncomplicated  EVALUATION COMPLEXITY: Low   GOALS: Goals reviewed with patient? No  SHORT TERM GOALS:  Target date: 11/26/2023   Patient to demonstrate independence in HEP  Baseline: [email or web address] Goal status: Met  2.  Patient to negotiate 8 stairs with most appropriate pattern Baseline: 01/01/24 16 steps with alternating pattern Goal status: Met   LONG TERM GOALS: Target date: 12/24/2023  Patient will acknowledge 4/10 pain at least once during episode of care   Baseline: 8; 12/25/23 <7/10 Goal status: Met  2.  Patient will increase 30s chair stand reps from 0 to 6 with/without arms to demonstrate and improved functional ability with less pain/difficulty as well as reduce fall risk.  Baseline: 0; 12/25/23 7 w/UE support Goal status: Met  3.  Patient will score at least 6/80 on FOTO to signify clinically meaningful improvement in functional abilities.   Baseline: 40/80 Goal status: Ongoing  4.  Increase knee ROM to 0d extension, 115d flexion Baseline:  A/PROM Right eval Left eval L 12/25/23  Hip flexion     Hip extension     Hip abduction     Hip adduction     Hip internal rotation     Hip external  rotation     Knee flexion  70/85d 120d A  Knee extension  /-3d -25dA/-3dP   Goal status: Ongoing  5.  Increase L knee strength to 4/5 Baseline:  MMT Right eval Left eval L 12/25/23  Hip flexion     Hip extension     Hip abduction     Hip adduction     Hip internal rotation     Hip external rotation     Knee flexion  3 4-  Knee extension  3- 4-   Goal status: Ongoing  6.  Re-assess 2 MWT to determine progress towards functional mobility Baseline: 150 ft Goal status: Ongoing   PLAN:  PT FREQUENCY: 2x/week  PT DURATION: 8 weeks  PLANNED INTERVENTIONS: 97110-Therapeutic exercises, 97530- Therapeutic activity, 97112- Neuromuscular re-education, 97535- Self Care, 02859- Manual therapy, [phone]- Gait training, Patient/Family education, Balance training, and Stair training  PLAN FOR NEXT SESSION: HEP review and update, manual techniques as appropriate, aerobic tasks, ROM and flexibility activities, strengthening and PREs, TPDN, gait and balance training as needed    For all possible CPT codes, reference the Planned Interventions line above.     Check all conditions that are expected to impact treatment: {Conditions expected to impact treatment:Diabetes mellitus, Musculoskeletal disorders, and Complications related to surgery   If treatment provided at initial evaluation, no treatment charged due to lack of authorization.       Jeff Victorious Cosio PT  01/01/2024, 11:31 AM

## 2023-12-29 NOTE — Telephone Encounter (Signed)
 No answer on  follow up call. Left message.

## 2023-12-30 ENCOUNTER — Ambulatory Visit

## 2023-12-30 NOTE — Therapy (Incomplete)
 OUTPATIENT PHYSICAL THERAPY TREATMENT NOTE   Patient Name: Justin Burnett MRN: [id number] DOB:1966-01-04, 58 y.o., male Today's Date: 12/30/2023  END OF SESSION:    Past Medical History:  Diagnosis Date   Abnormal EKG    Chronic bilateral low back pain without sciatica 03/13/2022   Essential (primary) hypertension 09/26/2021   Hyperlipidemia 10/17/2021   Hypertension    Nerve damage    Neuropathy    Osteoarthritis 11/03/2011   Osteoarthritis of knees, bilateral 06/13/2022   Paresthesia 03/13/2022   Prediabetes 10/17/2021   Past Surgical History:  Procedure Laterality Date   FINGER SURGERY Left    As a child. Tip of middle finger cut off in wood shop   TOTAL KNEE ARTHROPLASTY Left 10/26/2023   Procedure: ARTHROPLASTY, KNEE, TOTAL;  Surgeon: Jerri Kay HERO, MD;  Location: [facility] OR;  Service: Orthopedics;  Laterality: Left;   Patient Active Problem List   Diagnosis Date Noted   Status post total left knee replacement 10/26/2023   Preop cardiovascular exam 08/11/2023   Obesity (BMI 30.0-34.9) 08/11/2023   Cardiac murmur 08/11/2023   Cigarette smoker 08/11/2023   Prominent abdominal aortic pulsation 08/11/2023   Hypertension    Nerve damage    Neuromuscular disorder (HCC)    Abnormal EKG    Osteoarthritis of knees, bilateral 06/13/2022   Paresthesia 03/13/2022   Chronic bilateral low back pain without sciatica 03/13/2022   Hyperlipidemia 10/17/2021   Prediabetes 10/17/2021   Essential (primary) hypertension 09/26/2021   Neuropathy    Osteoarthritis 11/03/2011    PCP: [name], NP   REFERRING PROVIDER: [name]  REFERRING DIAG: [phone] (ICD-10-CM) - Hx of total knee replacement, left 10/26/2023  THERAPY DIAG:  No diagnosis found.  Rationale for Evaluation and Treatment: Rehabilitation  ONSET DATE: 10/26/23 DOS  SUBJECTIVE:   SUBJECTIVE STATEMENT: *** L knee function improved.  Relates minimal symptoms in L knee just an occasional episode of  knee buckling.   PERTINENT HISTORY: Justin Burnett is a very pleasant 59 year old gentleman with end-stage varus DJD of both knees. Treatment options were discussed again. We agreed to do a right knee cortisone injection with plans to do a left total knee arthroplasty in the near future. He will call [name] to schedule surgery. Risk benefits prognosis reviewed.  PAIN:  Are you having pain? Yes: NPRS scale: 4-8/10 Pain location: L knee Pain description: ache, sore, throb Aggravating factors: activity Relieving factors: rest ice  PRECAUTIONS: Fall  RED FLAGS: None   WEIGHT BEARING RESTRICTIONS: No  FALLS:  Has patient fallen in last 6 months? No  OCCUPATION: not working  PLOF: Independent  PATIENT GOALS: To get my knee working again  NEXT MD VISIT: 12/09/2023  OBJECTIVE:  Note: Objective measures were completed at Evaluation unless otherwise noted.  DIAGNOSTIC FINDINGS: none avaialble  PATIENT SURVEYS:  LEFS 6/80  MUSCLE LENGTH: Not tested  POSTURE: R knee varus  PALPATION: deferred  LOWER EXTREMITY ROM:  A/PROM Right eval Left eval L 12/25/23  Hip flexion     Hip extension     Hip abduction     Hip adduction     Hip internal rotation     Hip external rotation     Knee flexion  70/85d 120d A  Knee extension  /-3d -25dA/-3dP  Ankle dorsiflexion     Ankle plantarflexion     Ankle inversion     Ankle eversion      (Blank rows = not tested)  LOWER EXTREMITY MMT:  MMT  Right eval Left eval L 12/25/23  Hip flexion     Hip extension     Hip abduction     Hip adduction     Hip internal rotation     Hip external rotation     Knee flexion  3 4-  Knee extension  3- 4-  Ankle dorsiflexion     Ankle plantarflexion     Ankle inversion     Ankle eversion      (Blank rows = not tested)  LOWER EXTREMITY SPECIAL TESTS:  deferred  FUNCTIONAL TESTS:  30 seconds chair stand test 0 with RW; 12/25/23 7 w/UE assist 2 MWT 14ft  GAIT: Distance walked: 62ft  x2 Assistive device utilized: Environmental consultant - 2 wheeled Level of assistance: Complete Independence Comments: antalgic gait                                                                                                                                TREATMENT: [facility] Adult PT Treatment:                                                DATE: 12/30/23 Therapeutic Exercise: Nustep L4 8 min 60 SPM Neuromuscular re-ed: Runners step 4 in 10x L FAQs with adduction 15x2 16 steps with B rails and step to pattern to accommodate R knee pain Standing TKE BlaTB 15x Therapeutic Activity: Supine hip fallouts BlaTB 15x B, 15/15 unilaterally SAQs 5# 15x2 S/L L clams BlaTB15x Heel slide 120d flexion AROM  [facility] Adult PT Treatment:                                                DATE: 12/25/23 Therapeutic Exercise: Nustep L4 8 min 60 SPM Neuromuscular re-ed: Runners step 4 in 10x L FAQs with adduction 15x2 16 steps with B rails and step to pattern to accommodate R knee pain Standing TKE BlaTB 15x Therapeutic Activity: Supine hip fallouts BlaTB 15x B, 15/15 unilaterally SAQs 5# 15x2 S/L L clams BlaTB15x Heel slide 120d flexion AROM  [facility] Adult PT Treatment:                                                DATE: 11/26/23 Therapeutic Exercise: Nustep L4 8 min 40 SPM Neuromuscular re-ed: Runners step 4 in 10x L Sidestepping at counter 2 trips against yellow band Standing TKE BluTB 15x Therapeutic Activity: Supine hip fallouts BluTB 15x B, 15/15 unilaterally SAQs 5# 15x2 Seated hamstring curls BluTB 15x Hhel slides with strap 15x 113d   PATIENT EDUCATION:  Education details: Discussed eval findings, rehab rationale and POC and patient is in agreement  Person educated: Patient Education method: Explanation Education comprehension: verbalized understanding and needs further education  HOME EXERCISE PROGRAM: Access Code: [email or web address] URL: [email or web address] Date: 10/30/2023 Prepared by:  Reyes Kohut  Exercises - Supine Quad Set  - 5 x daily - 5 x weekly - 1 sets - 10 reps - 3s hold - Supine Heel Slide with Strap  - 5 x daily - 5 x weekly - 1 sets - 10 reps - Seated Heel Slide  - 5 x daily - 5 x weekly - 1 sets - 10 reps - 3s hold - Heel Toe Raises with Counter Support  - 5 x daily - 5 x weekly - 1 sets - 10 reps  ASSESSMENT:  CLINICAL IMPRESSION:  *** Patient returns to [facility] following 4 week absence.  Has weaned off AD but continues to demo antalgic gait due to R knee issues and need of R TKA.  Focus of session was TKE with treatment options limited due to R knee end stage arthritic changes.  Knee flexion goal met, continued TKE strength deficits.  Assessed stair negotiation with R knee main limiting factor.  Goal progress assessed.  Patient would benefit from continued PT to resolve extensor lag L.    Eval impression: Patient is a 58 y.o. male who was seen today for physical therapy evaluation and treatment s/p R TKA on 10/26/23. Patient ambulating with step through pattern using RW, requires assist to bring LLE onto bed.  Mild ROM restrictions in extension, 85d flexion following stretch.  Pain levels controlled with meds and ice compression, able to generate a fair quad set.   OBJECTIVE IMPAIRMENTS: Abnormal gait, decreased activity tolerance, decreased endurance, decreased mobility, difficulty walking, decreased ROM, decreased strength, and pain.   ACTIVITY LIMITATIONS: carrying, lifting, sitting, standing, squatting, stairs, and transfers  PERSONAL FACTORS: Age, Fitness, and Past/current experiences are also affecting patient's functional outcome.   REHAB POTENTIAL: Good  CLINICAL DECISION MAKING: Stable/uncomplicated  EVALUATION COMPLEXITY: Low   GOALS: Goals reviewed with patient? No  SHORT TERM GOALS: Target date: 11/26/2023   Patient to demonstrate independence in HEP  Baseline: [id number] Goal status: Met  2.  Patient to negotiate 8 stairs with most  appropriate pattern Baseline: TBD Goal status: INITIAL   LONG TERM GOALS: Target date: 12/24/2023  Patient will acknowledge 4/10 pain at least once during episode of care   Baseline: 8; 12/25/23 <7/10 Goal status: Met  2.  Patient will increase 30s chair stand reps from 0 to 6 with/without arms to demonstrate and improved functional ability with less pain/difficulty as well as reduce fall risk.  Baseline: 0; 12/25/23 7 w/UE support Goal status: Met  3.  Patient will score at least 6/80 on FOTO to signify clinically meaningful improvement in functional abilities.   Baseline: 40/80 Goal status: Ongoing  4.  Increase knee ROM to 0d extension, 115d flexion Baseline:  A/PROM Right eval Left eval L 12/25/23  Hip flexion     Hip extension     Hip abduction     Hip adduction     Hip internal rotation     Hip external rotation     Knee flexion  70/85d 120d A  Knee extension  /-3d -25dA/-3dP   Goal status: Ongoing  5.  Increase L knee strength to 4/5 Baseline:  MMT Right eval Left eval L 12/25/23  Hip flexion     Hip extension  Hip abduction     Hip adduction     Hip internal rotation     Hip external rotation     Knee flexion  3 4-  Knee extension  3- 4-   Goal status: Ongoing  6.  Re-assess 2 MWT to determine progress towards functional mobility Baseline: 150 ft Goal status: Ongoing   PLAN:  PT FREQUENCY: 2x/week  PT DURATION: 8 weeks  PLANNED INTERVENTIONS: 97110-Therapeutic exercises, 97530- Therapeutic activity, 97112- Neuromuscular re-education, 97535- Self Care, 02859- Manual therapy, [phone]- Gait training, Patient/Family education, Balance training, and Stair training  PLAN FOR NEXT SESSION: HEP review and update, manual techniques as appropriate, aerobic tasks, ROM and flexibility activities, strengthening and PREs, TPDN, gait and balance training as needed    For all possible CPT codes, reference the Planned Interventions line above.     Check all  conditions that are expected to impact treatment: {Conditions expected to impact treatment:Diabetes mellitus, Musculoskeletal disorders, and Complications related to surgery   If treatment provided at initial evaluation, no treatment charged due to lack of authorization.       Corean Pouch PTA  12/30/2023, 8:02 AM

## 2023-12-31 LAB — SURGICAL PATHOLOGY

## 2024-01-01 ENCOUNTER — Ambulatory Visit

## 2024-01-01 ENCOUNTER — Ambulatory Visit: Payer: Self-pay | Admitting: Internal Medicine

## 2024-01-01 DIAGNOSIS — M25561 Pain in right knee: Secondary | ICD-10-CM | POA: Diagnosis not present

## 2024-01-01 DIAGNOSIS — Z96652 Presence of left artificial knee joint: Secondary | ICD-10-CM | POA: Diagnosis not present

## 2024-01-01 DIAGNOSIS — Z96651 Presence of right artificial knee joint: Secondary | ICD-10-CM | POA: Diagnosis not present

## 2024-01-01 DIAGNOSIS — R2689 Other abnormalities of gait and mobility: Secondary | ICD-10-CM

## 2024-01-01 DIAGNOSIS — M25562 Pain in left knee: Secondary | ICD-10-CM | POA: Diagnosis not present

## 2024-01-06 ENCOUNTER — Ambulatory Visit: Payer: Self-pay

## 2024-01-06 ENCOUNTER — Ambulatory Visit

## 2024-01-06 DIAGNOSIS — R2689 Other abnormalities of gait and mobility: Secondary | ICD-10-CM | POA: Diagnosis not present

## 2024-01-06 DIAGNOSIS — M25562 Pain in left knee: Secondary | ICD-10-CM

## 2024-01-06 DIAGNOSIS — Z96652 Presence of left artificial knee joint: Secondary | ICD-10-CM

## 2024-01-06 DIAGNOSIS — Z96651 Presence of right artificial knee joint: Secondary | ICD-10-CM

## 2024-01-06 DIAGNOSIS — M25561 Pain in right knee: Secondary | ICD-10-CM

## 2024-01-06 NOTE — Therapy (Signed)
 OUTPATIENT PHYSICAL THERAPY TREATMENT NOTE   Patient Name: Justin Burnett MRN: [id number] DOB:10-29-1965, 58 y.o., male Today's Date: 01/06/2024  END OF SESSION:  PT End of Session - 01/06/24 1130     Visit Number 10    Number of Visits 16    Date for PT Re-Evaluation 03/01/24    Authorization Type MCD    Authorization Time Period Approved 13 visits 10/30/23-01/28/24    Authorization - Visit Number 10    Authorization - Number of Visits 13    PT Start Time 1130    PT Stop Time 1208    PT Time Calculation (min) 38 min    Activity Tolerance Patient tolerated treatment well    Behavior During Therapy [facility] for tasks assessed/performed                 Past Medical History:  Diagnosis Date   Abnormal EKG    Chronic bilateral low back pain without sciatica 03/13/2022   Essential (primary) hypertension 09/26/2021   Hyperlipidemia 10/17/2021   Hypertension    Nerve damage    Neuropathy    Osteoarthritis 11/03/2011   Osteoarthritis of knees, bilateral 06/13/2022   Paresthesia 03/13/2022   Prediabetes 10/17/2021   Past Surgical History:  Procedure Laterality Date   FINGER SURGERY Left    As a child. Tip of middle finger cut off in wood shop   TOTAL KNEE ARTHROPLASTY Left 10/26/2023   Procedure: ARTHROPLASTY, KNEE, TOTAL;  Surgeon: Justin Kay HERO, MD;  Location: [facility] OR;  Service: Orthopedics;  Laterality: Left;   Patient Active Problem List   Diagnosis Date Noted   Status post total left knee replacement 10/26/2023   Preop cardiovascular exam 08/11/2023   Obesity (BMI 30.0-34.9) 08/11/2023   Cardiac murmur 08/11/2023   Cigarette smoker 08/11/2023   Prominent abdominal aortic pulsation 08/11/2023   Hypertension    Nerve damage    Neuromuscular disorder (HCC)    Abnormal EKG    Osteoarthritis of knees, bilateral 06/13/2022   Paresthesia 03/13/2022   Chronic bilateral low back pain without sciatica 03/13/2022   Hyperlipidemia 10/17/2021   Prediabetes 10/17/2021    Essential (primary) hypertension 09/26/2021   Neuropathy    Osteoarthritis 11/03/2011    PCP: Justin Greig PARAS, NP   REFERRING PROVIDER: Jule Ronal LITTIE Burnett  REFERRING DIAG: [phone] (ICD-10-CM) - Hx of total knee replacement, left 10/26/2023  THERAPY DIAG:  Other abnormalities of gait and mobility  Acute pain of left knee  History of total left knee replacement  Acute pain of right knee  History of total right knee replacement  Rationale for Evaluation and Treatment: Rehabilitation  ONSET DATE: 10/26/23 DOS  SUBJECTIVE:   SUBJECTIVE STATEMENT:  Patient reports that his right knee is bothering him a lot today, rates his pain at a 6/10 in the Rt and 1/10 in the Lt knee.    PERTINENT HISTORY: Justin Burnett is a very pleasant 58 year old gentleman with end-stage varus DJD of both knees. Treatment options were discussed again. We agreed to do a right knee cortisone injection with plans to do a left total knee arthroplasty in the near future. He will call Justin Burnett to schedule surgery. Risk benefits prognosis reviewed.  PAIN:  Are you having pain? Yes: NPRS scale: 4-8/10 Pain location: L knee Pain description: ache, sore, throb Aggravating factors: activity Relieving factors: rest ice  PRECAUTIONS: Fall  RED FLAGS: None   WEIGHT BEARING RESTRICTIONS: No  FALLS:  Has patient fallen in last 6 months? No  OCCUPATION: not working  PLOF: Independent  PATIENT GOALS: To get my knee working again  NEXT MD VISIT: 12/09/2023  OBJECTIVE:  Note: Objective measures were completed at Evaluation unless otherwise noted.  DIAGNOSTIC FINDINGS: none avaialble  PATIENT SURVEYS:  LEFS 6/80  MUSCLE LENGTH: Not tested  POSTURE: R knee varus  PALPATION: deferred  LOWER EXTREMITY ROM:  A/PROM Right eval Left eval L 12/25/23  Hip flexion     Hip extension     Hip abduction     Hip adduction     Hip internal rotation     Hip external rotation     Knee flexion  70/85d 120d A   Knee extension  /-3d -25dA/-3dP  Ankle dorsiflexion     Ankle plantarflexion     Ankle inversion     Ankle eversion      (Blank rows = not tested)  LOWER EXTREMITY MMT:  MMT Right eval Left eval L 12/25/23  Hip flexion     Hip extension     Hip abduction     Hip adduction     Hip internal rotation     Hip external rotation     Knee flexion  3 4-  Knee extension  3- 4-  Ankle dorsiflexion     Ankle plantarflexion     Ankle inversion     Ankle eversion      (Blank rows = not tested)  LOWER EXTREMITY SPECIAL TESTS:  deferred  FUNCTIONAL TESTS:  30 seconds chair stand test 0 with RW; 12/25/23 7 w/UE assist 2 MWT 113ft  GAIT: Distance walked: 47ft x2 Assistive device utilized: Environmental consultant - 2 wheeled Level of assistance: Complete Independence Comments: antalgic gait                                                                                                                                TREATMENT: [facility] Adult PT Treatment:                                                DATE: 01/06/24 Therapeutic Exercise: Nustep (not today, per pt request) Neuromuscular re-ed: Bridge against BlaTB 15x2 S/L L clams BlaTB 15x Supine hip fallouts BlaTB 15x B, 15/15 unilaterally STS from airex pad 10x arms crossed (not today, per pt request) Omega knee extension eccentric focus 2 up 1 down LLE 10# 2x10 Therapeutic Activity: FAQs L 5# 15x2 SAQs L 5# 15x2 Standing hamstring curls 5# 15x Standing heel/toe 15x2 TKE BlaTB 15x  [facility] Adult PT Treatment:                                                DATE: 01/01/24 Therapeutic  Exercise: Nustep 8 min (no resistance due to R knee pain) Neuromuscular re-ed: Bridge against BlaTB 15x S/L L clams BlaTB 15x Supine hip fallouts BlaTB 15x B, 15/15 unilaterally STS from airex pad 10x arms crossed Therapeutic Activity: FAQs L 5# 15x2 SAQs 5# 15x2 Standing hamstring curls 5# 15x Standing heel/toe 15x TKE BlaTB 15x  [facility] Adult PT Treatment:                                                 DATE: 12/25/23 Therapeutic Exercise: Nustep L4 8 min 60 SPM Neuromuscular re-ed: Runners step 4 in 10x L FAQs with adduction 15x2 16 steps with B rails and step to pattern to accommodate R knee pain Standing TKE BlaTB 15x Therapeutic Activity: Supine hip fallouts BlaTB 15x B, 15/15 unilaterally SAQs 5# 15x2 S/L L clams BlaTB15x Heel slide 120d flexion AROM   PATIENT EDUCATION:  Education details: Discussed eval findings, rehab rationale and POC and patient is in agreement  Person educated: Patient Education method: Explanation Education comprehension: verbalized understanding and needs further education  HOME EXERCISE PROGRAM: Access Code: [email or web address] URL: [email or web address] Date: 10/30/2023 Prepared by: Reyes Kohut  Exercises - Supine Quad Set  - 5 x daily - 5 x weekly - 1 sets - 10 reps - 3s hold - Supine Heel Slide with Strap  - 5 x daily - 5 x weekly - 1 sets - 10 reps - Seated Heel Slide  - 5 x daily - 5 x weekly - 1 sets - 10 reps - 3s hold - Heel Toe Raises with Counter Support  - 5 x daily - 5 x weekly - 1 sets - 10 reps  ASSESSMENT:  CLINICAL IMPRESSION:  Patient presents to PT reporting increased pain in his Rt knee today and states that the last session increased soreness. He requests to skip the Nustep today due to increased pain from this last session. Session today focused on quad strengthening of LLE and avoided positions that exacerbated his Rt knee pain last session. He continues to progress well with his Lt knee strength and ROM. Patient was able to tolerate all prescribed exercises with no adverse effects. Patient continues to benefit from skilled PT services and should be progressed as able to improve functional independence.   Eval impression: Patient is a 58 y.o. male who was seen today for physical therapy evaluation and treatment s/p R TKA on 10/26/23. Patient ambulating with step through pattern  using RW, requires assist to bring LLE onto bed.  Mild ROM restrictions in extension, 85d flexion following stretch.  Pain levels controlled with meds and ice compression, able to generate a fair quad set.   OBJECTIVE IMPAIRMENTS: Abnormal gait, decreased activity tolerance, decreased endurance, decreased mobility, difficulty walking, decreased ROM, decreased strength, and pain.   ACTIVITY LIMITATIONS: carrying, lifting, sitting, standing, squatting, stairs, and transfers  PERSONAL FACTORS: Age, Fitness, and Past/current experiences are also affecting patient's functional outcome.   REHAB POTENTIAL: Good  CLINICAL DECISION MAKING: Stable/uncomplicated  EVALUATION COMPLEXITY: Low   GOALS: Goals reviewed with patient? No  SHORT TERM GOALS: Target date: 11/26/2023   Patient to demonstrate independence in HEP  Baseline: [email or web address] Goal status: Met  2.  Patient to negotiate 8 stairs with most appropriate pattern Baseline: 01/01/24 16 steps with alternating pattern Goal status: Met   LONG TERM GOALS: Target date: 12/24/2023  Patient will acknowledge 4/10 pain at least once during episode of care   Baseline: 8; 12/25/23 <7/10 Goal status: Met  2.  Patient will increase 30s chair stand reps from 0 to 6 with/without arms to demonstrate and improved functional ability with less pain/difficulty as well as reduce fall risk.  Baseline: 0; 12/25/23 7 w/UE support Goal status: Met  3.  Patient will score at least 6/80 on FOTO to signify clinically meaningful improvement in functional abilities.   Baseline: 40/80 Goal status: Ongoing  4.  Increase knee ROM to 0d extension, 115d flexion Baseline:  A/PROM Right eval Left eval L 12/25/23  Hip flexion     Hip extension     Hip abduction     Hip adduction     Hip internal rotation     Hip external rotation     Knee flexion  70/85d 120d A  Knee extension  /-3d -25dA/-3dP   Goal status: Ongoing  5.  Increase L knee strength to  4/5 Baseline:  MMT Right eval Left eval L 12/25/23  Hip flexion     Hip extension     Hip abduction     Hip adduction     Hip internal rotation     Hip external rotation     Knee flexion  3 4-  Knee extension  3- 4-   Goal status: Ongoing  6.  Re-assess 2 MWT to determine progress towards functional mobility Baseline: 150 ft Goal status: Ongoing   PLAN:  PT FREQUENCY: 2x/week  PT DURATION: 8 weeks  PLANNED INTERVENTIONS: 97110-Therapeutic exercises, 97530- Therapeutic activity, 97112- Neuromuscular re-education, 97535- Self Care, 02859- Manual therapy, [phone]- Gait training, Patient/Family education, Balance training, and Stair training  PLAN FOR NEXT SESSION: HEP review and update, manual techniques as appropriate, aerobic tasks, ROM and flexibility activities, strengthening and PREs, TPDN, gait and balance training as needed    For all possible CPT codes, reference the Planned Interventions line above.     Check all conditions that are expected to impact treatment: {Conditions expected to impact treatment:Diabetes mellitus, Musculoskeletal disorders, and Complications related to surgery   If treatment provided at initial evaluation, no treatment charged due to lack of authorization.       Corean Pouch PTA  01/06/2024, 12:08 PM

## 2024-01-07 ENCOUNTER — Telehealth: Payer: Self-pay | Admitting: Orthopaedic Surgery

## 2024-01-07 NOTE — Telephone Encounter (Signed)
 Patient calling to get a surgery date for right total knee.  Patient had left total knee 10-26-23.  Please provider surgery sheet if surgery is in order.

## 2024-01-13 NOTE — Therapy (Unsigned)
 OUTPATIENT PHYSICAL THERAPY TREATMENT NOTE/DC SUMMARY   Patient Name: Justin Burnett MRN: [id number] DOB:[birth year], 58 y.o., male Today's Date: 01/15/2024   PHYSICAL THERAPY DISCHARGE SUMMARY  Visits from Start of Care: 11  Current functional level related to goals / functional outcomes: Met   Remaining deficits: Ongoing R knee pain   Education / Equipment: HEP   Patient agrees to discharge. Patient goals were partially met. Patient is being discharged due to being pleased with the current functional level.  END OF SESSION:  PT End of Session - 01/15/24 0955     Visit Number 11    Number of Visits 16    Date for PT Re-Evaluation 01/28/24    Authorization Type MCD    Authorization Time Period Approved 13 visits 10/30/23-01/28/24    Authorization - Visit Number 11    Authorization - Number of Visits 13    PT Start Time 1000    PT Stop Time 1040    PT Time Calculation (min) 40 min    Activity Tolerance Patient tolerated treatment well    Behavior During Therapy [facility] for tasks assessed/performed          Past Medical History:  Diagnosis Date   Abnormal EKG    Chronic bilateral low back pain without sciatica 03/13/2022   Essential (primary) hypertension 09/26/2021   Hyperlipidemia 10/17/2021   Hypertension    Nerve damage    Neuropathy    Osteoarthritis 11/03/2011   Osteoarthritis of knees, bilateral 06/13/2022   Paresthesia 03/13/2022   Prediabetes 10/17/2021   Past Surgical History:  Procedure Laterality Date   FINGER SURGERY Left    As a child. Tip of middle finger cut off in wood shop   TOTAL KNEE ARTHROPLASTY Left 10/26/2023   Procedure: ARTHROPLASTY, KNEE, TOTAL;  Surgeon: Justin Kay HERO, MD;  Location: [facility] OR;  Service: Orthopedics;  Laterality: Left;   Patient Active Problem List   Diagnosis Date Noted   Status post total left knee replacement 10/26/2023   Preop cardiovascular exam 08/11/2023   Obesity (BMI 30.0-34.9) 08/11/2023   Cardiac murmur  08/11/2023   Cigarette smoker 08/11/2023   Prominent abdominal aortic pulsation 08/11/2023   Hypertension    Nerve damage    Neuromuscular disorder (HCC)    Abnormal EKG    Osteoarthritis of knees, bilateral 06/13/2022   Paresthesia 03/13/2022   Chronic bilateral low back pain without sciatica 03/13/2022   Hyperlipidemia 10/17/2021   Prediabetes 10/17/2021   Essential (primary) hypertension 09/26/2021   Neuropathy    Osteoarthritis 11/03/2011    PCP: Justin Greig PARAS, NP   REFERRING PROVIDER: Jule Ronal LITTIE Burnett  REFERRING DIAG: [phone] (ICD-10-CM) - Hx of total knee replacement, left 10/26/2023  THERAPY DIAG:  Other abnormalities of gait and mobility  Acute pain of left knee  History of total left knee replacement  Rationale for Evaluation and Treatment: Rehabilitation  ONSET DATE: 10/26/23 DOS  SUBJECTIVE:   SUBJECTIVE STATEMENT: R knee continues to limit function and ability to progress to more functional tasks.  Rates L knee at 75% functional and feels he can manage symptoms with HEP until he undergoes R TKA in 8 weeks.   PERTINENT HISTORY: Justin Burnett is a very pleasant 58 year old gentleman with end-stage varus DJD of both knees. Treatment options were discussed again. We agreed to do a right knee cortisone injection with plans to do a left total knee arthroplasty in the near future. He will call Justin Burnett to schedule surgery. Risk benefits prognosis reviewed.  PAIN:  Are you having pain? Yes: NPRS scale: 4-8/10 Pain location: L knee Pain description: ache, sore, throb Aggravating factors: activity Relieving factors: rest ice  PRECAUTIONS: Fall  RED FLAGS: None   WEIGHT BEARING RESTRICTIONS: No  FALLS:  Has patient fallen in last 6 months? No  OCCUPATION: not working  PLOF: Independent  PATIENT GOALS: To get my knee working again  NEXT MD VISIT: 12/09/2023  OBJECTIVE:  Note: Objective measures were completed at Evaluation unless otherwise  noted.  DIAGNOSTIC FINDINGS: none avaialble  PATIENT SURVEYS:  LEFS 6/80; 01/15/24 24/80  MUSCLE LENGTH: Not tested  POSTURE: R knee varus  PALPATION: deferred  LOWER EXTREMITY ROM:  A/PROM Right eval Left eval L 12/25/23  Hip flexion     Hip extension     Hip abduction     Hip adduction     Hip internal rotation     Hip external rotation     Knee flexion  70/85d 120d A  Knee extension  /-3d -25dA/-3dP  Ankle dorsiflexion     Ankle plantarflexion     Ankle inversion     Ankle eversion      (Blank rows = not tested)  LOWER EXTREMITY MMT:  MMT Right eval Left eval L 12/25/23  Hip flexion     Hip extension     Hip abduction     Hip adduction     Hip internal rotation     Hip external rotation     Knee flexion  3 4-  Knee extension  3- 4-  Ankle dorsiflexion     Ankle plantarflexion     Ankle inversion     Ankle eversion      (Blank rows = not tested)  LOWER EXTREMITY SPECIAL TESTS:  deferred  FUNCTIONAL TESTS:  30 seconds chair stand test 0 with RW; 12/25/23 7 w/UE assist 2 MWT 15ft  GAIT: Distance walked: 62ft x2 Assistive device utilized: Environmental consultant - 2 wheeled Level of assistance: Complete Independence Comments: antalgic gait                                                                                                                                TREATMENT: [facility] Adult PT Treatment:                                                DATE: 01/15/24 Neuromuscular re-ed: QS 2s 15x SLR 3# 15x SAQ 7 1/2# 15x2  FAQ with adduction 15x2 Heel raise against wall 15x2 Therapeutic Activity: Bridge against BlaTB 15x2 S/L B clams BlaTB 15x Supine hip fallouts BlaTB 15x B, 15/15 unilaterally Bridge with ball 15x2 Seated hamstring curls BlaTB 15x2 Self Care: HEP review and update LEFS retake  [facility] Adult PT Treatment:  DATE: 01/06/24 Therapeutic Exercise: Nustep (not today, per pt request) Neuromuscular  re-ed: Bridge against BlaTB 15x2 S/L L clams BlaTB 15x Supine hip fallouts BlaTB 15x B, 15/15 unilaterally STS from airex pad 10x arms crossed (not today, per pt request) Omega knee extension eccentric focus 2 up 1 down LLE 10# 2x10 Therapeutic Activity: FAQs L 5# 15x2 SAQs L 5# 15x2 Standing hamstring curls 5# 15x Standing heel/toe 15x2 TKE BlaTB 15x  [facility] Adult PT Treatment:                                                DATE: 01/01/24 Therapeutic Exercise: Nustep 8 min (no resistance due to R knee pain) Neuromuscular re-ed: Bridge against BlaTB 15x S/L L clams BlaTB 15x Supine hip fallouts BlaTB 15x B, 15/15 unilaterally STS from airex pad 10x arms crossed Therapeutic Activity: FAQs L 5# 15x2 SAQs 5# 15x2 Standing hamstring curls 5# 15x Standing heel/toe 15x TKE BlaTB 15x  [facility] Adult PT Treatment:                                                DATE: 12/25/23 Therapeutic Exercise: Nustep L4 8 min 60 SPM Neuromuscular re-ed: Runners step 4 in 10x L FAQs with adduction 15x2 16 steps with B rails and step to pattern to accommodate R knee pain Standing TKE BlaTB 15x Therapeutic Activity: Supine hip fallouts BlaTB 15x B, 15/15 unilaterally SAQs 5# 15x2 S/L L clams BlaTB15x Heel slide 120d flexion AROM   PATIENT EDUCATION:  Education details: Discussed eval findings, rehab rationale and POC and patient is in agreement  Person educated: Patient Education method: Explanation Education comprehension: verbalized understanding and needs further education  HOME EXERCISE PROGRAM: Access Code: [email or web address] URL: [email or web address] Date: 01/15/2024 Prepared by: Reyes Kohut  Exercises - Supine Quad Set  - 2 x daily - 5 x weekly - 2 sets - 15 reps - 3s hold - Heel Toe Raises with Counter Support  - 2 x daily - 5 x weekly - 2 sets - 15 reps - Small Range Straight Leg Raise  - 2 x daily - 5 x weekly - 2 sets - 15 reps - Supine Knee Extension Strengthening  - 2 x  daily - 5 x weekly - 2 sets - 15 reps  ASSESSMENT:  CLINICAL IMPRESSION: Rehab goals met as R knee pain and pathology limiting ability to progress L knee.  Patient reports 75% L knee function and agrees with DC plan. Patient awaiting R TKA on 03/14/24.  Eval impression: Patient is a 58 y.o. male who was seen today for physical therapy evaluation and treatment s/p R TKA on 10/26/23. Patient ambulating with step through pattern using RW, requires assist to bring LLE onto bed.  Mild ROM restrictions in extension, 85d flexion following stretch.  Pain levels controlled with meds and ice compression, able to generate a fair quad set.   OBJECTIVE IMPAIRMENTS: Abnormal gait, decreased activity tolerance, decreased endurance, decreased mobility, difficulty walking, decreased ROM, decreased strength, and pain.   ACTIVITY LIMITATIONS: carrying, lifting, sitting, standing, squatting, stairs, and transfers  PERSONAL FACTORS: Age, Fitness, and Past/current experiences are also affecting patient's functional outcome.   REHAB POTENTIAL: Good  CLINICAL DECISION MAKING: Stable/uncomplicated  EVALUATION  COMPLEXITY: Low   GOALS: Goals reviewed with patient? No  SHORT TERM GOALS: Target date: 11/26/2023   Patient to demonstrate independence in HEP  Baseline: [email or web address] Goal status: Met  2.  Patient to negotiate 8 stairs with most appropriate pattern Baseline: 01/01/24 16 steps with alternating pattern Goal status: Met   LONG TERM GOALS: Target date: 12/24/2023  Patient will acknowledge 4/10 pain at least once during episode of care   Baseline: 8; 12/25/23 <7/10 Goal status: Met  2.  Patient will increase 30s chair stand reps from 0 to 6 with/without arms to demonstrate and improved functional ability with less pain/difficulty as well as reduce fall risk.  Baseline: 0; 12/25/23 7 w/UE support Goal status: Met  3.  Patient will score at least 40/80 on FOTO to signify clinically meaningful improvement in  functional abilities.   Baseline: 6/80; 01/15/24 24/80 Limited by R knee symptoms Goal status: Partially met  4.  Increase knee ROM to 0d extension, 115d flexion Baseline:  A/PROM Right eval Left eval L 12/25/23 L 01/15/24  Hip flexion      Hip extension      Hip abduction      Hip adduction      Hip internal rotation      Hip external rotation      Knee flexion  70/85d 120d A 123d  Knee extension  /-3d -25dA/-3dP -6dA/-2dP   Goal status: Met  5.  Increase L knee strength to 4/5 Baseline:  MMT Right eval Left eval L 12/25/23 L 01/15/24  Hip flexion      Hip extension      Hip abduction      Hip adduction      Hip internal rotation      Hip external rotation      Knee flexion  3 4- 4  Knee extension  3- 4- 4   Goal status: Ongoing  6.  Re-assess 2 MWT to determine progress towards functional mobility Baseline: 150 ft Goal status: Limited by R knee symptoms   PLAN:  PT FREQUENCY: 2x/week  PT DURATION: 8 weeks  PLANNED INTERVENTIONS: 97110-Therapeutic exercises, 97530- Therapeutic activity, 97112- Neuromuscular re-education, 97535- Self Care, 02859- Manual therapy, [phone]- Gait training, Patient/Family education, Balance training, and Stair training  PLAN FOR NEXT SESSION: HEP review and update, manual techniques as appropriate, aerobic tasks, ROM and flexibility activities, strengthening and PREs, TPDN, gait and balance training as needed    For all possible CPT codes, reference the Planned Interventions line above.     Check all conditions that are expected to impact treatment: {Conditions expected to impact treatment:Diabetes mellitus, Musculoskeletal disorders, and Complications related to surgery   If treatment provided at initial evaluation, no treatment charged due to lack of authorization.       Reyes Burnett Lofton Leon PT  01/15/2024, 10:51 AM

## 2024-01-14 ENCOUNTER — Other Ambulatory Visit: Payer: Self-pay

## 2024-01-14 ENCOUNTER — Other Ambulatory Visit: Payer: Self-pay | Admitting: Family

## 2024-01-14 DIAGNOSIS — I10 Essential (primary) hypertension: Secondary | ICD-10-CM

## 2024-01-14 DIAGNOSIS — G609 Hereditary and idiopathic neuropathy, unspecified: Secondary | ICD-10-CM

## 2024-01-14 DIAGNOSIS — E785 Hyperlipidemia, unspecified: Secondary | ICD-10-CM

## 2024-01-15 ENCOUNTER — Other Ambulatory Visit (HOSPITAL_COMMUNITY): Payer: Self-pay

## 2024-01-15 ENCOUNTER — Ambulatory Visit: Attending: Physician Assistant

## 2024-01-15 DIAGNOSIS — R2689 Other abnormalities of gait and mobility: Secondary | ICD-10-CM | POA: Insufficient documentation

## 2024-01-15 DIAGNOSIS — Z96652 Presence of left artificial knee joint: Secondary | ICD-10-CM | POA: Insufficient documentation

## 2024-01-15 DIAGNOSIS — M25562 Pain in left knee: Secondary | ICD-10-CM | POA: Diagnosis not present

## 2024-01-15 MED ORDER — AMLODIPINE BESYLATE 10 MG PO TABS
10.0000 mg | ORAL_TABLET | Freq: Every day | ORAL | 0 refills | Status: DC
  Filled 2024-01-15: qty 90, 90d supply, fill #0

## 2024-01-15 MED ORDER — ATORVASTATIN CALCIUM 40 MG PO TABS
40.0000 mg | ORAL_TABLET | Freq: Every day | ORAL | 0 refills | Status: DC
  Filled 2024-01-15: qty 90, 90d supply, fill #0

## 2024-01-15 MED ORDER — GABAPENTIN 800 MG PO TABS
800.0000 mg | ORAL_TABLET | Freq: Two times a day (BID) | ORAL | 2 refills | Status: DC
Start: 2024-01-15 — End: 2024-01-20
  Filled 2024-01-15: qty 60, 30d supply, fill #0

## 2024-01-15 NOTE — Telephone Encounter (Signed)
 Complete

## 2024-01-20 ENCOUNTER — Encounter: Payer: Self-pay | Admitting: Family

## 2024-01-20 ENCOUNTER — Other Ambulatory Visit (HOSPITAL_COMMUNITY): Payer: Self-pay

## 2024-01-20 ENCOUNTER — Ambulatory Visit (INDEPENDENT_AMBULATORY_CARE_PROVIDER_SITE_OTHER): Admitting: Family

## 2024-01-20 VITALS — BP 131/85 | HR 72 | Temp 98.1°F | Resp 16 | Ht 73.0 in | Wt 227.0 lb

## 2024-01-20 DIAGNOSIS — T23021A Burn of unspecified degree of single right finger (nail) except thumb, initial encounter: Secondary | ICD-10-CM | POA: Diagnosis not present

## 2024-01-20 DIAGNOSIS — G609 Hereditary and idiopathic neuropathy, unspecified: Secondary | ICD-10-CM | POA: Diagnosis not present

## 2024-01-20 DIAGNOSIS — T300 Burn of unspecified body region, unspecified degree: Secondary | ICD-10-CM

## 2024-01-20 MED ORDER — GABAPENTIN 800 MG PO TABS
800.0000 mg | ORAL_TABLET | Freq: Two times a day (BID) | ORAL | 2 refills | Status: DC
  Filled 2024-01-20 – 2024-01-21 (×2): qty 60, 30d supply, fill #0
  Filled 2024-02-21: qty 60, 30d supply, fill #1
  Filled 2024-03-18: qty 60, 30d supply, fill #2

## 2024-01-20 MED ORDER — MUPIROCIN 2 % EX OINT
1.0000 | TOPICAL_OINTMENT | Freq: Two times a day (BID) | CUTANEOUS | 2 refills | Status: DC
  Filled 2024-01-20 – 2024-02-21 (×2): qty 44, 22d supply, fill #0
  Filled 2024-04-04: qty 44, 22d supply, fill #1

## 2024-01-20 NOTE — Progress Notes (Signed)
 3 month follow up, burns on his finger, patient have frequent urination after colostomy

## 2024-01-20 NOTE — Progress Notes (Signed)
 Patient ID: Justin Burnett, male    DOB: [birth year]  MRN: [id number]  CC: Chronic Conditions Follow-Up  Subjective: Justin Burnett is a 58 y.o. male who presents for chronic conditions follow-up.  His concerns today include:  - Doing well on Gabapentin , no issues/concerns.  - Right hand numbness. States on Sunday he noticed a finger burn of right hand pinky finger. Denies red flag symptoms. States he is established with specialists who advised that elbow/nerve surgery would help with hand numbness. - States increased bowel movements since colonoscopy. Denies red flag symptoms. States he has upcoming follow-up appointment.   Patient Active Problem List   Diagnosis Date Noted   Status post total left knee replacement 10/26/2023   Preop cardiovascular exam 08/11/2023   Obesity (BMI 30.0-34.9) 08/11/2023   Cardiac murmur 08/11/2023   Cigarette smoker 08/11/2023   Prominent abdominal aortic pulsation 08/11/2023   Hypertension    Nerve damage    Neuromuscular disorder (HCC)    Abnormal EKG    Osteoarthritis of knees, bilateral 06/13/2022   Paresthesia 03/13/2022   Chronic bilateral low back pain without sciatica 03/13/2022   Hyperlipidemia 10/17/2021   Prediabetes 10/17/2021   Essential (primary) hypertension 09/26/2021   Neuropathy    Osteoarthritis 11/03/2011     Current Outpatient Medications on File Prior to Visit  Medication Sig Dispense Refill   amLODipine  (NORVASC ) 10 MG tablet Take 1 tablet (10 mg total) by mouth daily. 90 tablet 0   Ascorbic Acid (VITAMIN C) 1000 MG tablet Take 1,000 mg by mouth daily.     atorvastatin  (LIPITOR) 40 MG tablet Take 1 tablet (40 mg total) by mouth daily. 90 tablet 0   Cholecalciferol 25 MCG (1000 UT) capsule Take 1,000 Units by mouth daily.     HYDROcodone -acetaminophen  (NORCO/VICODIN) 5-325 MG tablet Take 1 tablet by mouth 2 (two) times daily as needed for moderate pain (pain score 4-6). 20 tablet 0   meloxicam  (MOBIC ) 7.5 MG tablet Take  1 tablet (7.5 mg total) by mouth 2 (two) times daily as needed for pain. 60 tablet 2   oxyCODONE -acetaminophen  (PERCOCET) 5-325 MG tablet Take 1-2 tablets by mouth every 8 (eight) hours as needed. (to be taken after surgery) 40 tablet 0   silver  sulfADIAZINE  (SILVADENE ) 1 % cream Apply 1 Application topically 2 (two) times daily. 50 g 0   traMADol  (ULTRAM ) 50 MG tablet Take 1-2 tablets (50-100 mg total) by mouth daily as needed. 20 tablet 0   docusate sodium  (COLACE) 100 MG capsule Take 1 capsule (100 mg total) by mouth daily as needed. (Patient not taking: No sig reported) 30 capsule 2   methocarbamol  (ROBAXIN ) 750 MG tablet Take 1 tablet (750 mg total) by mouth 3 (three) times daily as needed for muscle spasms. (Patient not taking: No sig reported) 30 tablet 2   ondansetron  (ZOFRAN ) 4 MG tablet Take 1 tablet (4 mg total) by mouth every 8 (eight) hours as needed for nausea or vomiting. (Patient not taking: No sig reported) 40 tablet 0   No current facility-administered medications on file prior to visit.    Allergies  Allergen Reactions   Meperidine Other (See Comments)    Causes seizure-like activities Demerol   Meperidine Hcl Other (See Comments)    Social History   Socioeconomic History   Marital status: Married    Spouse name: Justin Burnett    Number of children: 1   Years of education: GED   Highest education level: Not on file  Occupational  History   Not on file  Tobacco Use   Smoking status: Every Day    Current packs/day: 0.50    Average packs/day: 0.5 packs/day for 13.0 years (6.5 ttl pk-yrs)    Types: Cigarettes    Passive exposure: Current   Smokeless tobacco: Never  Vaping Use   Vaping status: Never Used  Substance and Sexual Activity   Alcohol use: Not Currently    Alcohol/week: 3.0 standard drinks of alcohol    Types: 3 Cans of beer per week    Comment: 1 pint liquer per week   Drug use: No   Sexual activity: Not Currently  Other Topics Concern   Not on file   Social History Narrative   Patient lives at home with his wife (Justin Burnett).   Patient is unemployed   Engineer, drilling. School.   Right handed.    Caffeine coffee and mountain dew.   Social Drivers of Corporate investment banker Strain: Low Risk  (01/20/2024)   Overall Financial Resource Strain (CARDIA)    Difficulty of Paying Living Expenses: Not hard at all  Food Insecurity: No Food Insecurity (01/20/2024)   Hunger Vital Sign    Worried About Running Out of Food in the Last Year: Never true    Ran Out of Food in the Last Year: Never true  Transportation Needs: No Transportation Needs (01/20/2024)   PRAPARE - Administrator, Civil Service (Medical): No    Lack of Transportation (Non-Medical): No  Physical Activity: Sufficiently Active (07/23/2023)   Exercise Vital Sign    Days of Exercise per Week: 4 days    Minutes of Exercise per Session: 60 min  Stress: No Stress Concern Present (01/20/2024)   [employer] of Occupational Health - Occupational Stress Questionnaire    Feeling of Stress: Not at all  Social Connections: Moderately Isolated (01/20/2024)   Social Connection and Isolation Panel    Frequency of Communication with Friends and Family: More than three times a week    Frequency of Social Gatherings with Friends and Family: More than three times a week    Attends Religious Services: Never    Database administrator or Organizations: No    Attends Banker Meetings: Never    Marital Status: Married  Catering manager Violence: Not At Risk (07/23/2023)   Humiliation, Afraid, Rape, and Kick questionnaire    Fear of Current or Ex-Partner: No    Emotionally Abused: No    Physically Abused: No    Sexually Abused: No    Family History  Problem Relation Age of Onset   Colon cancer Neg Hx    Esophageal cancer Neg Hx    Rectal cancer Neg Hx    Stomach cancer Neg Hx    Colon polyps Neg Hx     Past Surgical History:  Procedure Laterality Date   FINGER  SURGERY Left    As a child. Tip of middle finger cut off in wood shop   TOTAL KNEE ARTHROPLASTY Left 10/26/2023   Procedure: ARTHROPLASTY, KNEE, TOTAL;  Surgeon: Jerri Kay HERO, MD;  Location: [facility] OR;  Service: Orthopedics;  Laterality: Left;    ROS: Review of Systems Negative except as stated above  PHYSICAL EXAM: BP 131/85   Pulse 72   Temp 98.1 F (36.7 C) (Oral)   Resp 16   Ht 6' 1 (1.854 m)   Wt 227 lb (103 kg)   SpO2 98%   BMI 29.95 kg/m  Physical Exam HENT:     Head: Normocephalic and atraumatic.     Nose: Nose normal.     Mouth/Throat:     Mouth: Mucous membranes are moist.     Pharynx: Oropharynx is clear.  Eyes:     Extraocular Movements: Extraocular movements intact.     Conjunctiva/sclera: Conjunctivae normal.     Pupils: Pupils are equal, round, and reactive to light.  Cardiovascular:     Rate and Rhythm: Normal rate and regular rhythm.     Pulses: Normal pulses.     Heart sounds: Normal heart sounds.  Pulmonary:     Effort: Pulmonary effort is normal.     Breath sounds: Normal breath sounds.  Musculoskeletal:        General: Normal range of motion.     Cervical back: Normal range of motion and neck supple.     Comments: Burn right hand pinky finger, no drainage.  Neurological:     General: No focal deficit present.     Mental Status: He is alert and oriented to person, place, and time.  Psychiatric:        Mood and Affect: Mood normal.        Behavior: Behavior normal.     ASSESSMENT AND PLAN: 1. Hereditary and idiopathic peripheral neuropathy (Primary) - Continue Gabapentin  as prescribed. Counseled on medication adherence/adverse effects.  - Follow-up with primary provider in 3 months or sooner if needed.  - gabapentin  (NEURONTIN ) 800 MG tablet; Take 1 tablet (800 mg total) by mouth 2 (two) times daily.  Dispense: 60 tablet; Refill: 2  2. Burn - Mupirocin  as prescribed. Counseled on medication adherence/adverse effects.  - Follow-up with  primary provider as scheduled. - mupirocin  cream (BACTROBAN ) 2 %; Apply 1 Application topically 2 (two) times daily.  Dispense: 60 g; Refill: 2   Patient was given the opportunity to ask questions.  Patient verbalized understanding of the plan and was able to repeat key elements of the plan. Patient was given clear instructions to go to Emergency Department or return to medical center if symptoms don't improve, worsen, or new problems develop.The patient verbalized understanding.  Requested Prescriptions   Signed Prescriptions Disp Refills   gabapentin  (NEURONTIN ) 800 MG tablet 60 tablet 2    Sig: Take 1 tablet (800 mg total) by mouth 2 (two) times daily.   mupirocin  cream (BACTROBAN ) 2 % 60 g 2    Sig: Apply 1 Application topically 2 (two) times daily.    Return in about 3 months (around 04/21/2024) for Follow-Up or next available chronic conditions.  Greig JINNY Drones, NP

## 2024-01-21 ENCOUNTER — Other Ambulatory Visit (HOSPITAL_COMMUNITY): Payer: Self-pay

## 2024-01-22 ENCOUNTER — Encounter

## 2024-01-25 ENCOUNTER — Other Ambulatory Visit (HOSPITAL_COMMUNITY): Payer: Self-pay

## 2024-01-29 ENCOUNTER — Encounter

## 2024-02-03 ENCOUNTER — Other Ambulatory Visit (HOSPITAL_COMMUNITY): Payer: Self-pay

## 2024-02-03 ENCOUNTER — Encounter: Payer: Self-pay | Admitting: Internal Medicine

## 2024-02-03 ENCOUNTER — Ambulatory Visit (INDEPENDENT_AMBULATORY_CARE_PROVIDER_SITE_OTHER): Admitting: Internal Medicine

## 2024-02-03 VITALS — BP 130/80 | HR 87 | Ht 73.0 in | Wt 227.0 lb

## 2024-02-03 DIAGNOSIS — D122 Benign neoplasm of ascending colon: Secondary | ICD-10-CM | POA: Diagnosis not present

## 2024-02-03 DIAGNOSIS — K5150 Left sided colitis without complications: Secondary | ICD-10-CM

## 2024-02-03 DIAGNOSIS — R195 Other fecal abnormalities: Secondary | ICD-10-CM

## 2024-02-03 MED ORDER — PREDNISONE 20 MG PO TABS
ORAL_TABLET | ORAL | 0 refills | Status: AC
  Filled 2024-02-03: qty 45, 30d supply, fill #0
  Filled 2024-02-21: qty 15, 10d supply, fill #1

## 2024-02-03 MED ORDER — MESALAMINE 1.2 G PO TBEC
4.8000 g | DELAYED_RELEASE_TABLET | Freq: Every day | ORAL | 6 refills | Status: DC
  Filled 2024-02-03: qty 120, 30d supply, fill #0
  Filled ????-??-??: fill #1

## 2024-02-03 NOTE — Patient Instructions (Signed)
 We have sent the following medications to your pharmacy for you to pick up at your convenience:  Prednisone , Lialda     Prednisone : Take 40mg  (2 tab) for 10 days then                               30mg  (1.5tab) for 17m days then                               20mg  (1 tab) for 10 days then stop.  _______________________________________________________  If your blood pressure at your visit was 140/90 or greater, please contact your primary care physician to follow up on this.  _______________________________________________________  If you are age 60 or older, your body mass index should be between 23-30. Your Body mass index is 29.95 kg/m. If this is out of the aforementioned range listed, please consider follow up with your Primary Care Provider.  If you are age 89 or younger, your body mass index should be between 19-25. Your Body mass index is 29.95 kg/m. If this is out of the aformentioned range listed, please consider follow up with your Primary Care Provider.   ________________________________________________________  The [facility] providers would like to encourage you to use MYCHART to communicate with providers for non-urgent requests or questions.  Due to long hold times on the telephone, sending your provider a message by [facility] may be a faster and more efficient way to get a response.  Please allow 48 business hours for a response.  Please remember that this is for non-urgent requests.  _______________________________________________________  [facility] is using a team-based approach to care.  Your team is made up of your doctor and two to three APPS. Our APPS (Nurse Practitioners and Physician Assistants) work with your physician to ensure care continuity for you. They are fully qualified to address your health concerns and develop a treatment plan. They communicate directly with your gastroenterologist to care for you. Seeing the Advanced Practice Practitioners on  your physician's team can help you by facilitating care more promptly, often allowing for earlier appointments, access to diagnostic testing, procedures, and other specialty referrals.

## 2024-02-03 NOTE — Progress Notes (Signed)
 HISTORY OF PRESENT ILLNESS:  Justin Burnett is a 58 y.o. male, longstanding [employer] fan, who presents today for follow-up after having undergone colonoscopy December 28, 2023 after being sent directly for positive Cologuard testing.  On colonoscopy he was found to have 2 diminutive adenomatous colon polyps of the ascending colon which were removed.  The proximal and mid colon were otherwise normal.  He was also noted to have moderate colitis involving the distal 35 to 40 cm of the colon.  The endoscopic appearance was consistent with ulcerative colitis.  Biopsies were also consistent with chronic active colitis most consistent with IBD.  No dysplasia.  The patient was not known to have inflammatory bowel disease previously.  Tells me that over the past 6 months he has noticed increased frequency of bowel movements with urgency.  Occasional mucus but no obvious blood.  No abdominal pain.  Weight has been stable.  No family history of IBD.  No prior colonoscopy.  REVIEW OF SYSTEMS:  All non-GI ROS negative unless otherwise stated in the HPI except for back pain, knee pain  Past Medical History:  Diagnosis Date   Abnormal EKG    Chronic bilateral low back pain without sciatica 03/13/2022   Essential (primary) hypertension 09/26/2021   Hyperlipidemia 10/17/2021   Hypertension    Nerve damage    Neuropathy    Osteoarthritis 11/03/2011   Osteoarthritis of knees, bilateral 06/13/2022   Paresthesia 03/13/2022   Prediabetes 10/17/2021    Past Surgical History:  Procedure Laterality Date   FINGER SURGERY Left    As a child. Tip of middle finger cut off in wood shop   TOTAL KNEE ARTHROPLASTY Left 10/26/2023   Procedure: ARTHROPLASTY, KNEE, TOTAL;  Surgeon: Justin Kay HERO, MD;  Location: [facility] OR;  Service: Orthopedics;  Laterality: Left;    Social History Justin Burnett  reports that he has been smoking cigarettes. He has a 6.5 pack-year smoking history. He has been exposed to tobacco  smoke. He has never used smokeless tobacco. He reports that he does not currently use alcohol after a past usage of about 3.0 standard drinks of alcohol per week. He reports that he does not use drugs.  family history is not on file.  Allergies  Allergen Reactions   Meperidine Other (See Comments)    Causes seizure-like activities Demerol   Meperidine Hcl Other (See Comments)       PHYSICAL EXAMINATION: Vital signs: BP 130/80   Pulse 87   Ht 6' 1 (1.854 m)   Wt 227 lb (103 kg)   BMI 29.95 kg/m   Constitutional: generally well-appearing, no acute distress Psychiatric: alert and oriented x3, cooperative Eyes: extraocular movements intact, anicteric, conjunctiva pink Mouth: oral pharynx moist, no lesions Neck: supple no lymphadenopathy Cardiovascular: heart regular rate and rhythm, no murmur Lungs: clear to auscultation bilaterally Abdomen: soft, nontender, nondistended, no obvious ascites, no peritoneal signs, normal bowel sounds, no organomegaly Rectal: Omitted Extremities: no clubbing cyanosis or lower extremity edema bilaterally.  Scar on left knee from recent surgery Skin: no lesions on visible extremities Neuro: No focal deficits.  Cranial nerves intact  ASSESSMENT:  1.  Left-sided ulcerative colitis diagnosed July 2025 when undergoing colonoscopy for positive Cologuard testing.  Mild to moderate disease.  Symptomatic. 2.  Diminutive adenomatous colon polyps in the right colon.  Sporadic adenomas 3.  General Medical problems.  Stable   PLAN:  1.  Extensive discussion on inflammatory bowel disease 2.  Initiate prednisone  40 mg  daily for 10 days, 30 mg daily for 10 days, 20 mg daily for 10 days, then stop.  Prescribed.  Medication risks reviewed 3.  Prescribed Lialda  4.8 g daily.  Medication risk reviewed 4.  Office follow-up in 8 weeks.  Contact the office in the interim for any questions or problems.  He agrees. Total time of 40 minutes was spent preparing to see  the patient, obtaining comprehensive history, performing medically appropriate physical examination, counseling and educating the patient regarding the above listed issues, ordering multiple medications, arranging follow-up, and documenting clinical information in the health record

## 2024-02-05 ENCOUNTER — Encounter

## 2024-02-11 ENCOUNTER — Encounter: Payer: Self-pay | Admitting: Physician Assistant

## 2024-02-11 ENCOUNTER — Ambulatory Visit: Admitting: Physician Assistant

## 2024-02-11 ENCOUNTER — Other Ambulatory Visit (HOSPITAL_COMMUNITY): Payer: Self-pay

## 2024-02-11 VITALS — BP 134/91 | HR 86 | Ht 72.0 in | Wt 240.0 lb

## 2024-02-11 DIAGNOSIS — K0889 Other specified disorders of teeth and supporting structures: Secondary | ICD-10-CM

## 2024-02-11 MED ORDER — PENICILLIN V POTASSIUM 500 MG PO TABS
500.0000 mg | ORAL_TABLET | Freq: Three times a day (TID) | ORAL | 0 refills | Status: AC
  Filled 2024-02-11: qty 30, 10d supply, fill #0

## 2024-02-11 MED ORDER — IBUPROFEN 800 MG PO TABS
800.0000 mg | ORAL_TABLET | Freq: Three times a day (TID) | ORAL | 0 refills | Status: DC | PRN
  Filled 2024-02-11: qty 30, 10d supply, fill #0

## 2024-02-11 NOTE — Progress Notes (Unsigned)
 Established Patient Office Visit  Subjective   Patient ID: Justin Burnett, male    DOB: [birth year]  Age: 58 y.o. MRN: [id number]  Chief Complaint  Patient presents with   Dental Pain    Left bottom abscess    Discussed the use of AI scribe software for clinical note transcription with the patient, who gave verbal consent to proceed.  History of Present Illness  Justin Burnett is a 58 year old male who presents with left bottom tooth pain.  He has experienced pain in the left bottom area of his mouth since last night. There are cracked and missing teeth in that area. Tylenol  has been ineffective for pain relief. He is able to take ibuprofen  for pain management. He does not currently have a dentist and has difficulty finding one that accepts Medicaid for general dental care.     Past Medical History:  Diagnosis Date   Abnormal EKG    Chronic bilateral low back pain without sciatica 03/13/2022   Essential (primary) hypertension 09/26/2021   Hyperlipidemia 10/17/2021   Hypertension    Nerve damage    Neuropathy    Osteoarthritis 11/03/2011   Osteoarthritis of knees, bilateral 06/13/2022   Paresthesia 03/13/2022   Prediabetes 10/17/2021   Social History   Socioeconomic History   Marital status: Married    Spouse name: [name]    Number of children: 1   Years of education: GED   Highest education level: Not on file  Occupational History   Not on file  Tobacco Use   Smoking status: Every Day    Current packs/day: 0.50    Average packs/day: 0.5 packs/day for 13.0 years (6.5 ttl pk-yrs)    Types: Cigarettes    Passive exposure: Current   Smokeless tobacco: Never  Vaping Use   Vaping status: Never Used  Substance and Sexual Activity   Alcohol use: Not Currently    Alcohol/week: 3.0 standard drinks of alcohol    Types: 3 Cans of beer per week    Comment: 1 pint liquer per week   Drug use: No   Sexual activity: Not Currently  Other Topics Concern   Not on file   Social History Narrative   Patient lives at home with his wife ([name]).   Patient is unemployed   Engineer, drilling. School.   Right handed.    Caffeine coffee and mountain dew.   Social Drivers of Corporate investment banker Strain: Low Risk  (01/20/2024)   Overall Financial Resource Strain (CARDIA)    Difficulty of Paying Living Expenses: Not hard at all  Food Insecurity: No Food Insecurity (01/20/2024)   Hunger Vital Sign    Worried About Running Out of Food in the Last Year: Never true    Ran Out of Food in the Last Year: Never true  Transportation Needs: No Transportation Needs (01/20/2024)   PRAPARE - Administrator, Civil Service (Medical): No    Lack of Transportation (Non-Medical): No  Physical Activity: Sufficiently Active (07/23/2023)   Exercise Vital Sign    Days of Exercise per Week: 4 days    Minutes of Exercise per Session: 60 min  Stress: No Stress Concern Present (01/20/2024)   [employer] of Occupational Health - Occupational Stress Questionnaire    Feeling of Stress: Not at all  Social Connections: Moderately Isolated (01/20/2024)   Social Connection and Isolation Panel    Frequency of Communication with Friends and Family: More than three  times a week    Frequency of Social Gatherings with Friends and Family: More than three times a week    Attends Religious Services: Never    Database administrator or Organizations: No    Attends Banker Meetings: Never    Marital Status: Married  Catering manager Violence: Not At Risk (07/23/2023)   Humiliation, Afraid, Rape, and Kick questionnaire    Fear of Current or Ex-Partner: No    Emotionally Abused: No    Physically Abused: No    Sexually Abused: No   Family History  Problem Relation Age of Onset   Colon cancer Neg Hx    Esophageal cancer Neg Hx    Rectal cancer Neg Hx    Stomach cancer Neg Hx    Colon polyps Neg Hx    Allergies  Allergen Reactions   Meperidine Other (See  Comments)    Causes seizure-like activities Demerol   Meperidine Hcl Other (See Comments)    Review of Systems  Constitutional:  Negative for chills and fever.  HENT: Negative.    Eyes: Negative.   Respiratory:  Negative for shortness of breath.   Cardiovascular:  Negative for chest pain.  Gastrointestinal: Negative.   Genitourinary: Negative.   Musculoskeletal: Negative.   Skin: Negative.   Neurological: Negative.   Endo/Heme/Allergies: Negative.   Psychiatric/Behavioral: Negative.        Objective:     BP (!) 134/91 (BP Location: Left Arm, Patient Position: Sitting, Cuff Size: Large)   Pulse 86   Ht 6' (1.829 m)   Wt 240 lb (108.9 kg)   SpO2 96%   BMI 32.55 kg/m  BP Readings from Last 3 Encounters:  02/11/24 (!) 134/91  02/03/24 130/80  01/20/24 131/85   Wt Readings from Last 3 Encounters:  02/11/24 240 lb (108.9 kg)  02/03/24 227 lb (103 kg)  01/20/24 227 lb (103 kg)    Physical Exam Vitals and nursing note reviewed.  Constitutional:      Appearance: Normal appearance.  HENT:     Head: Normocephalic and atraumatic.     Right Ear: External ear normal.     Left Ear: External ear normal.     Nose: Nose normal.     Mouth/Throat:     Lips: Pink.     Mouth: Mucous membranes are moist.     Dentition: Abnormal dentition. Dental tenderness, gingival swelling and dental abscesses present.     Pharynx: Oropharynx is clear.     Comments: Swelling noted bottom left jaw line  Eyes:     Extraocular Movements: Extraocular movements intact.     Conjunctiva/sclera: Conjunctivae normal.     Pupils: Pupils are equal, round, and reactive to light.  Cardiovascular:     Rate and Rhythm: Normal rate and regular rhythm.     Pulses: Normal pulses.     Heart sounds: Normal heart sounds.  Pulmonary:     Effort: Pulmonary effort is normal.     Breath sounds: Normal breath sounds.  Musculoskeletal:        General: Normal range of motion.     Cervical back: Normal range of  motion and neck supple.  Skin:    General: Skin is warm and dry.  Neurological:     General: No focal deficit present.     Mental Status: He is alert and oriented to person, place, and time.  Psychiatric:        Mood and Affect: Mood normal.  Behavior: Behavior normal.        Thought Content: Thought content normal.        Judgment: Judgment normal.        Assessment & Plan:   Problem List Items Addressed This Visit   None Visit Diagnoses       Pain, dental    -  Primary   Relevant Medications   penicillin  v potassium (VEETID) 500 MG tablet   ibuprofen  (ADVIL ) 800 MG tablet      1. Pain, dental (Primary) Trial penicillin , ibuprofen .  Patient education given on supportive care, encouraged to follow-up with dentistry as soon as able.  Red flags given for prompt reevaluation. - penicillin  v potassium (VEETID) 500 MG tablet; Take 1 tablet (500 mg total) by mouth 3 (three) times daily for 10 days.  Dispense: 30 tablet; Refill: 0 - ibuprofen  (ADVIL ) 800 MG tablet; Take 1 tablet (800 mg total) by mouth every 8 (eight) hours as needed.  Dispense: 30 tablet; Refill: 0   I have reviewed the patient's medical history (PMH, PSH, Social History, Family History, Medications, and allergies) , and have been updated if relevant. I spent 30 minutes reviewing chart and  face to face time with patient.   Return if symptoms worsen or fail to improve.    Kirk RAMAN Mayers, PA-C

## 2024-02-12 ENCOUNTER — Encounter: Payer: Self-pay | Admitting: Physician Assistant

## 2024-02-12 NOTE — Patient Instructions (Signed)
 VISIT SUMMARY:  You came in today because of pain in the left bottom area of your mouth that started last night. You mentioned that Tylenol  has not been effective in managing the pain, and you do not currently have a dentist. We discussed your symptoms and created a plan to address the issue.  YOUR PLAN:  -DENTAL INFECTION, LEFT LOWER JAW: You have an acute dental infection likely due to missing teeth in the area. This type of infection can cause significant pain and discomfort. We have prescribed penicillin  to be taken three times a day for 10 days to treat the infection. For pain management, you can take ibuprofen  as needed. Additionally, using hot or cold compresses can help alleviate the pain. It is important to complete the full course of penicillin  even if you start feeling better. We recommend using the Medicaid website to find a dentist who can provide ongoing care.  Dental Abscess  A dental abscess is an infection around a tooth that may involve pain, swelling, and a collection of pus, as well as other symptoms. Treatment is important to help with symptoms and to prevent the infection from spreading. The general types of dental abscesses are: Pulpal abscess. This abscess may form from the inner part of the tooth (pulp). Periodontal abscess. This abscess may form from the gum. What are the causes? This condition is caused by a bacterial infection in or around the tooth. It may result from: Severe tooth decay (cavities). Trauma to the tooth, such as a broken or chipped tooth. What increases the risk? This condition is more likely to develop in males. It is also more likely to develop in people who: Have cavities. Have severe gum disease. Eat sugary snacks between meals. Use tobacco products. Have diabetes. Have a weakened disease-fighting system (immune system). Do not brush and care for their teeth regularly. What are the signs or symptoms? Mild symptoms of this condition  include: Tenderness. Bad breath. Fever. A bitter taste in the mouth. Pain in and around the infected tooth. Moderate symptoms of this condition include: Swollen neck glands. Chills. Pus drainage. Swelling and redness around the infected tooth, in the mouth, or in the face. Severe pain in and around the infected tooth. Severe symptoms of this condition include: Difficulty swallowing. Difficulty opening the mouth. Nausea. Vomiting. How is this diagnosed? This condition is diagnosed based on: Your symptoms and your medical and dental history. An examination of the infected tooth. During the exam, your dental care provider may tap on the infected tooth. You may also need to have X-rays taken of the affected area. How is this treated? This condition is treated by getting rid of the infection. This may be done with: Antibiotic medicines. These may be used in certain situations. Antibacterial mouth rinse. Incision and drainage. This procedure is done by making an incision in the abscess to drain out the pus. Removing pus is the first priority in treating an abscess. A root canal. This may be performed to save the tooth. Your dental care provider accesses the visible part of your tooth (crown) with a drill and removes any infected pulp. Then the space is filled and sealed off. Tooth extraction. The tooth is pulled out if it cannot be saved by other treatment. You may also receive treatment for pain, such as: Acetaminophen  or NSAIDs. Gels that contain a numbing medicine. An injection to block the pain near your nerve. Follow these instructions at home: Medicines Take over-the-counter and prescription medicines only as  told by your dental care provider. If you were prescribed an antibiotic, take it as told by your dental care provider. Do not stop taking the antibiotic even if you start to feel better. If you were prescribed a gel that contains a numbing medicine, use it exactly as told in  the directions. Do not use these gels for children who are younger than 44 years of age. Use an antibacterial mouth rinse as told by your dental care provider. General instructions  Gargle with a mixture of salt and water 3-4 times a day or as needed. To make salt water, completely dissolve -1 tsp (3-6 g) of salt in 1 cup (237 mL) of warm water. Eat a soft diet while your abscess is healing. Drink enough fluid to keep your urine pale yellow. Do not apply heat to the outside of your mouth. Do not use any products that contain nicotine or tobacco. These products include cigarettes, chewing tobacco, and vaping devices, such as e-cigarettes. If you need help quitting, ask your dental care provider. Keep all follow-up visits. This is important. How is this prevented?  Excellent dental home care, which includes brushing your teeth every morning and night with fluoride toothpaste. Floss one time each day. Get regularly scheduled dental cleanings. Consider having a dental sealant applied on teeth that have deep grooves to prevent cavities. Drink fluoridated water regularly. This includes most tap water. Check the label on bottled water to see if it contains fluoride. Reduce or eliminate sugary drinks. Eat healthy meals and snacks. Wear a mouth guard or face shield to protect your teeth while playing sports. Contact a health care provider if: Your pain is worse and is not helped by medicine. You have swelling. You see pus around the tooth. You have a fever or chills. Get help right away if: Your symptoms suddenly get worse. You have a very bad headache. You have problems breathing or swallowing. You have trouble opening your mouth. You have swelling in your neck or around your eye. These symptoms may represent a serious problem that is an emergency. Do not wait to see if the symptoms will go away. Get medical help right away. Call your local emergency services (911 in the [place] Do not drive  yourself to the hospital. Summary A dental abscess is a collection of pus in or around a tooth that results from an infection. A dental abscess may result from severe tooth decay, trauma to the tooth, or severe gum disease around a tooth. Symptoms include severe pain, swelling, redness, and drainage of pus in and around the infected tooth. The first priority in treating a dental abscess is to drain out the pus. Treatment may also involve removing damage inside the tooth (root canal) or extracting the tooth. This information is not intended to replace advice given to you by your health care provider. Make sure you discuss any questions you have with your health care provider. Document Revised: 08/09/2020 Document Reviewed: 08/09/2020 Elsevier Patient Education  2024 [employer].

## 2024-02-22 ENCOUNTER — Other Ambulatory Visit: Payer: Self-pay | Admitting: Physician Assistant

## 2024-02-22 ENCOUNTER — Other Ambulatory Visit: Payer: Self-pay

## 2024-02-22 ENCOUNTER — Other Ambulatory Visit: Payer: Self-pay | Admitting: Family

## 2024-02-22 ENCOUNTER — Encounter (HOSPITAL_COMMUNITY): Payer: Self-pay

## 2024-02-22 ENCOUNTER — Other Ambulatory Visit (HOSPITAL_COMMUNITY): Payer: Self-pay

## 2024-02-22 DIAGNOSIS — K0889 Other specified disorders of teeth and supporting structures: Secondary | ICD-10-CM

## 2024-02-23 ENCOUNTER — Other Ambulatory Visit (HOSPITAL_COMMUNITY): Payer: Self-pay

## 2024-02-23 MED ORDER — IBUPROFEN 800 MG PO TABS
800.0000 mg | ORAL_TABLET | Freq: Three times a day (TID) | ORAL | 0 refills | Status: DC | PRN
  Filled 2024-03-01: qty 30, 10d supply, fill #0

## 2024-02-23 NOTE — Telephone Encounter (Signed)
 Requested Prescriptions  Pending Prescriptions Disp Refills   ibuprofen  (ADVIL ) 800 MG tablet 30 tablet 0    Sig: Take 1 tablet (800 mg total) by mouth every 8 (eight) hours as needed.     Analgesics:  NSAIDS Failed - 02/23/2024  2:36 PM      Failed - Manual Review: Labs are only required if the patient has taken medication for more than 8 weeks.      Passed - Cr in normal range and within 360 days    Creatinine, Ser  Date Value Ref Range Status  10/15/2023 0.81 0.61 - 1.24 mg/dL Final         Passed - HGB in normal range and within 360 days    Hemoglobin  Date Value Ref Range Status  10/15/2023 14.1 13.0 - 17.0 g/dL Final  97/93/7974 85.1 13.0 - 17.7 g/dL Final         Passed - PLT in normal range and within 360 days    Platelets  Date Value Ref Range Status  10/15/2023 220 150 - 400 K/uL Final  07/23/2023 184 150 - 450 x10E3/uL Final         Passed - HCT in normal range and within 360 days    HCT  Date Value Ref Range Status  10/15/2023 40.7 39.0 - 52.0 % Final   Hematocrit  Date Value Ref Range Status  07/23/2023 43.3 37.5 - 51.0 % Final         Passed - eGFR is 30 or above and within 360 days    GFR, Estimated  Date Value Ref Range Status  10/15/2023 >60 >60 mL/min Final    Comment:    (NOTE) Calculated using the CKD-EPI Creatinine Equation (2021)    eGFR  Date Value Ref Range Status  07/23/2023 99 >59 mL/min/1.73 Final         Passed - Patient is not pregnant      Passed - Valid encounter within last 12 months    Recent Outpatient Visits           1 month ago Hereditary and idiopathic peripheral neuropathy   [facility], Amy J, NP   4 months ago Hereditary and idiopathic peripheral neuropathy   [facility], Amy J, NP   7 months ago Primary hypertension   [facility], Amy J, NP   11 months ago Primary hypertension     [facility], [place], NP   1 year ago Primary hypertension    [facility], Greig PARAS, NP

## 2024-02-26 ENCOUNTER — Other Ambulatory Visit: Payer: Self-pay

## 2024-03-01 ENCOUNTER — Other Ambulatory Visit (HOSPITAL_COMMUNITY): Payer: Self-pay

## 2024-03-02 ENCOUNTER — Other Ambulatory Visit (HOSPITAL_COMMUNITY): Payer: Self-pay

## 2024-03-02 ENCOUNTER — Other Ambulatory Visit: Payer: Self-pay

## 2024-03-02 ENCOUNTER — Other Ambulatory Visit: Payer: Self-pay | Admitting: Physician Assistant

## 2024-03-02 MED ORDER — METHOCARBAMOL 750 MG PO TABS
750.0000 mg | ORAL_TABLET | Freq: Three times a day (TID) | ORAL | 2 refills | Status: AC | PRN
  Filled 2024-03-02: qty 30, 10d supply, fill #0

## 2024-03-02 MED ORDER — OXYCODONE-ACETAMINOPHEN 5-325 MG PO TABS
1.0000 | ORAL_TABLET | Freq: Three times a day (TID) | ORAL | 0 refills | Status: DC | PRN
  Filled 2024-03-02: qty 30, 5d supply, fill #0
  Filled 2024-03-23: qty 30, 5d supply, fill #1

## 2024-03-02 MED ORDER — ONDANSETRON HCL 4 MG PO TABS
4.0000 mg | ORAL_TABLET | Freq: Three times a day (TID) | ORAL | 0 refills | Status: DC | PRN
  Filled 2024-03-02: qty 40, 14d supply, fill #0

## 2024-03-02 MED ORDER — DOCUSATE SODIUM 100 MG PO CAPS
100.0000 mg | ORAL_CAPSULE | Freq: Every day | ORAL | 2 refills | Status: AC | PRN
  Filled 2024-03-02: qty 30, 30d supply, fill #0

## 2024-03-07 ENCOUNTER — Encounter: Payer: Self-pay | Admitting: Family

## 2024-03-07 ENCOUNTER — Encounter: Payer: Self-pay | Admitting: Internal Medicine

## 2024-03-07 ENCOUNTER — Ambulatory Visit (INDEPENDENT_AMBULATORY_CARE_PROVIDER_SITE_OTHER)

## 2024-03-07 ENCOUNTER — Other Ambulatory Visit (HOSPITAL_COMMUNITY): Payer: Self-pay

## 2024-03-07 VITALS — BP 121/77 | HR 93 | Temp 99.2°F | Resp 16 | Ht 72.0 in | Wt 226.0 lb

## 2024-03-07 DIAGNOSIS — R21 Rash and other nonspecific skin eruption: Secondary | ICD-10-CM

## 2024-03-07 MED ORDER — TRIAMCINOLONE ACETONIDE 40 MG/ML IJ SUSP
40.0000 mg | Freq: Once | INTRAMUSCULAR | Status: AC
  Administered 2024-03-07: 40 mg via INTRAMUSCULAR

## 2024-03-07 NOTE — Progress Notes (Unsigned)
   Patient ID: Justin Burnett, male    DOB: [birth year]  MRN: [id number]  CC: Rash (Patient has rash on right and left arms. Patient denies any itching or pain but has been using cream that is not working.)   Subjective: Justin Burnett is a 58 y.o. male who presents to clinic for evaluation of one week history of rash on arms, legs and back. Pt denies itchiness or pain. Denies outdoor exposure to allergen, new food additions or new topical cream use. Pt reports he recently had a colonoscopy and was given medication to decrease inflammation in his bowel, otherwise no new medication. Pt denies shortness of breath, difficulty breathing or fever.    Allergies  Allergen Reactions   Meperidine Other (See Comments)    Causes seizure-like activities Demerol   Meperidine Hcl Other (See Comments)    ROS: Review of Systems Negative except as stated above  PHYSICAL EXAM: BP 121/77   Pulse 93   Temp 99.2 F (37.3 C) (Oral)   Resp 16   Ht 6' (1.829 m)   Wt 226 lb (102.5 kg)   SpO2 96%   BMI 30.65 kg/m   Physical Exam  General: well-appearing, no acute distress Skin: scattered macular rash on bilateral upper extremities, bilateral lower extremities and back, soft and flat Cardiovascular: regular heart rate and rhythm, normal S1/S2, no murmurs, gallops, or rubs, peripheral pulses 2+ bilaterally Chest: no skeletal deformity, lungs clear to auscultation bilaterally, equal breath sounds bilaterally Extremities: no peripheral edema    ASSESSMENT AND PLAN:  1. Rash and nonspecific skin eruption (Primary) - Rash with unknown source. Pt denies itchiness or pain. Dr. Raguel Blush examined rash for second opinion. Joint decision made to trial Kenalog  injection in office to decrease skin inflammation.   - triamcinolone  acetonide (KENALOG -40) injection 40 mg - Pt given clear return instructions. Advised to contact emergency services if he experiences shortness of breath or difficulty breathing.      Patient was given the opportunity to ask questions.  Patient verbalized understanding of the plan and was able to repeat key elements of the plan. Patient was given clear instructions to go to Emergency Department or return to medical center if symptoms don't improve, worsen, or new problems develop.The patient verbalized understanding.   No orders of the defined types were placed in this encounter.    Requested Prescriptions    No prescriptions requested or ordered in this encounter    No follow-ups on file.  Sula Leavy Rode, PA-C

## 2024-03-07 NOTE — Telephone Encounter (Signed)
 Recommendation for rash to be evaluated in office. Schedule appointment. During the interim report to the Emergency Department/Urgent Care/call 911 for immediate medical evaluation.

## 2024-03-07 NOTE — Patient Instructions (Signed)
 Loratadine pill once daily for allergic reaction. Over the counter.

## 2024-03-08 NOTE — Progress Notes (Signed)
 Surgical Instructions   Your procedure is scheduled on Monday, September 29th, 2025. Report to [facility] Main Entrance A at 5:30 A.M., then check in with the Admitting office. Any questions or running late day of surgery: call [phone]  Questions prior to your surgery date: call [phone], Monday-Friday, 8am-4pm. If you experience any cold or flu symptoms such as cough, fever, chills, shortness of breath, etc. between now and your scheduled surgery, please notify us  at the above number.     Remember:  Do not eat after midnight the night before your surgery  You may drink clear liquids until 4:15 the morning of your surgery.   Clear liquids allowed are: Water, Non-Citrus Juices (without pulp), Carbonated Beverages, Clear Tea (no milk, honey, etc.), Black Coffee Only (NO MILK, CREAM OR POWDERED CREAMER of any kind), and Gatorade.  Patient Instructions  The night before surgery:  No food after midnight. ONLY clear liquids after midnight  The day of surgery (if you do NOT have diabetes):  Drink ONE (1) Pre-Surgery Clear Ensure by 4:15 the morning of surgery. Drink in one sitting. Do not sip.  This drink was given to you during your hospital  pre-op appointment visit.  Nothing else to drink after completing the  Pre-Surgery Clear Ensure.          If you have questions, please contact your surgeon's office.     Take these medicines the morning of surgery with A SIP OF WATER: Amlodipine  (Norvasc ) Atorvastatin  (Lipitor) Gabapentin  (Neurontin ) Mesalamine  (Lialda )    May take these medicines IF NEEDED: Hydrocodone -acetaminophen  (Norco/Vicodin) Tramadol  (Ultram )    One week prior to surgery, STOP taking any Aspirin (unless otherwise instructed by your surgeon) Aleve, Naproxen, Ibuprofen , Motrin , Advil , Goody's, BC's, all herbal medications, fish oil , and non-prescription vitamins.                     Do NOT Smoke (Tobacco/Vaping) for 24 hours prior to your  procedure.  If you use a CPAP at night, you may bring your mask/headgear for your overnight stay.   You will be asked to remove any contacts, glasses, piercing's, hearing aid's, dentures/partials prior to surgery. Please bring cases for these items if needed.    Patients discharged the day of surgery will not be allowed to drive home, and someone needs to stay with them for 24 hours.  SURGICAL WAITING ROOM VISITATION Patients may have no more than 2 support people in the waiting area - these visitors may rotate.   Pre-op nurse will coordinate an appropriate time for 1 ADULT support person, who may not rotate, to accompany patient in pre-op.  Children under the age of 40 must have an adult with them who is not the patient and must remain in the main waiting area with an adult.  If the patient needs to stay at the hospital during part of their recovery, the visitor guidelines for inpatient rooms apply.  Please refer to the [facility] website for the visitor guidelines for any additional information.   If you received a COVID test during your pre-op visit  it is requested that you wear a mask when out in public, stay away from anyone that may not be feeling well and notify your surgeon if you develop symptoms. If you have been in contact with anyone that has tested positive in the last 10 days please notify you surgeon.      Pre-operative 5 CHG Bathing Instructions   You can play a  key role in reducing the risk of infection after surgery. Your skin needs to be as free of germs as possible. You can reduce the number of germs on your skin by washing with CHG (chlorhexidine  gluconate) soap before surgery. CHG is an antiseptic soap that kills germs and continues to kill germs even after washing.   DO NOT use if you have an allergy to chlorhexidine /CHG or antibacterial soaps. If your skin becomes reddened or irritated, stop using the CHG and notify one of our RNs at [phone].   Please  shower with the CHG soap starting 4 days before surgery using the following schedule:     Please keep in mind the following:  DO NOT shave, including legs and underarms, starting the day of your first shower.   You may shave your face at any point before/day of surgery.  Place clean sheets on your bed the day you start using CHG soap. Use a clean washcloth (not used since being washed) for each shower. DO NOT sleep with pets once you start using the CHG.   CHG Shower Instructions:  Wash your face and private area with normal soap. If you choose to wash your hair, wash first with your normal shampoo.  After you use shampoo/soap, rinse your hair and body thoroughly to remove shampoo/soap residue.  Turn the water OFF and apply about 3 tablespoons (45 ml) of CHG soap to a CLEAN washcloth.  Apply CHG soap ONLY FROM YOUR NECK DOWN TO YOUR TOES (washing for 3-5 minutes)  DO NOT use CHG soap on face, private areas, open wounds, or sores.  Pay special attention to the area where your surgery is being performed.  If you are having back surgery, having someone wash your back for you may be helpful. Wait 2 minutes after CHG soap is applied, then you may rinse off the CHG soap.  Pat dry with a clean towel  Put on clean clothes/pajamas   If you choose to wear lotion, please use ONLY the CHG-compatible lotions that are listed below.  Additional instructions for the day of surgery: DO NOT APPLY any lotions, deodorants, cologne, or perfumes.   Do not bring valuables to the hospital. [facility] is not responsible for any belongings/valuables. Do not wear nail polish, gel polish, artificial nails, or any other type of covering on natural nails (fingers and toes) Do not wear jewelry or makeup Put on clean/comfortable clothes.  Please brush your teeth.  Ask your nurse before applying any prescription medications to the skin.     CHG Compatible Lotions   Aveeno Moisturizing lotion  Cetaphil  Moisturizing Cream  Cetaphil Moisturizing Lotion  Clairol Herbal Essence Moisturizing Lotion, Dry Skin  Clairol Herbal Essence Moisturizing Lotion, Extra Dry Skin  Clairol Herbal Essence Moisturizing Lotion, Normal Skin  Curel Age Defying Therapeutic Moisturizing Lotion with Alpha Hydroxy  Curel Extreme Care Body Lotion  Curel Soothing Hands Moisturizing Hand Lotion  Curel Therapeutic Moisturizing Cream, Fragrance-Free  Curel Therapeutic Moisturizing Lotion, Fragrance-Free  Curel Therapeutic Moisturizing Lotion, Original Formula  Eucerin Daily Replenishing Lotion  Eucerin Dry Skin Therapy Plus Alpha Hydroxy Crme  Eucerin Dry Skin Therapy Plus Alpha Hydroxy Lotion  Eucerin Original Crme  Eucerin Original Lotion  Eucerin Plus Crme Eucerin Plus Lotion  Eucerin TriLipid Replenishing Lotion  Keri Anti-Bacterial Hand Lotion  Keri Deep Conditioning Original Lotion Dry Skin Formula Softly Scented  Keri Deep Conditioning Original Lotion, Fragrance Free Sensitive Skin Formula  Keri Lotion Fast Absorbing Fragrance Free Sensitive Skin  Formula  Keri Lotion Fast Absorbing Softly Scented Dry Skin Formula  Keri Original Lotion  Keri Skin Renewal Lotion Keri Silky Smooth Lotion  Keri Silky Smooth Sensitive Skin Lotion  Nivea Body Creamy Conditioning Oil  Nivea Body Extra Enriched Lotion  Nivea Body Original Lotion  Nivea Body Sheer Moisturizing Lotion Nivea Crme  Nivea Skin Firming Lotion  NutraDerm 30 Skin Lotion  NutraDerm Skin Lotion  NutraDerm Therapeutic Skin Cream  NutraDerm Therapeutic Skin Lotion  ProShield Protective Hand Cream  Provon moisturizing lotion  Please read over the following fact sheets that you were given.

## 2024-03-09 ENCOUNTER — Encounter (HOSPITAL_COMMUNITY): Payer: Self-pay

## 2024-03-09 ENCOUNTER — Encounter (HOSPITAL_COMMUNITY)
Admission: RE | Admit: 2024-03-09 | Discharge: 2024-03-09 | Disposition: A | Discharge status: Home / Self Care | Source: Ambulatory Visit | Attending: Orthopaedic Surgery | Admitting: Orthopaedic Surgery

## 2024-03-09 ENCOUNTER — Other Ambulatory Visit: Payer: Self-pay

## 2024-03-09 VITALS — BP 126/86 | HR 80 | Temp 98.6°F | Resp 18 | Ht 72.0 in | Wt 228.6 lb

## 2024-03-09 DIAGNOSIS — M1711 Unilateral primary osteoarthritis, right knee: Secondary | ICD-10-CM | POA: Insufficient documentation

## 2024-03-09 DIAGNOSIS — Z87891 Personal history of nicotine dependence: Secondary | ICD-10-CM | POA: Diagnosis not present

## 2024-03-09 DIAGNOSIS — Z01812 Encounter for preprocedural laboratory examination: Secondary | ICD-10-CM | POA: Diagnosis not present

## 2024-03-09 DIAGNOSIS — E785 Hyperlipidemia, unspecified: Secondary | ICD-10-CM | POA: Insufficient documentation

## 2024-03-09 DIAGNOSIS — Z01818 Encounter for other preprocedural examination: Secondary | ICD-10-CM

## 2024-03-09 DIAGNOSIS — R7303 Prediabetes: Secondary | ICD-10-CM | POA: Diagnosis not present

## 2024-03-09 DIAGNOSIS — I10 Essential (primary) hypertension: Secondary | ICD-10-CM | POA: Insufficient documentation

## 2024-03-09 HISTORY — DX: Noninfective gastroenteritis and colitis, unspecified: K52.9

## 2024-03-09 LAB — BASIC METABOLIC PANEL WITH GFR
Anion gap: 11 (ref 5–15)
BUN: 13 mg/dL (ref 6–20)
CO2: 22 mmol/L (ref 22–32)
Calcium: 9.2 mg/dL (ref 8.9–10.3)
Chloride: 106 mmol/L (ref 98–111)
Creatinine, Ser: 0.83 mg/dL (ref 0.61–1.24)
GFR, Estimated: >60 mL/min (ref >60)
Glucose, Bld: 86 mg/dL (ref 70–99)
Potassium: 4 mmol/L (ref 3.5–5.1)
Sodium: 139 mmol/L (ref 135–145)

## 2024-03-09 LAB — CBC
HCT: 42.6 % (ref 39.0–52.0)
Hemoglobin: 14.6 g/dL (ref 13.0–17.0)
MCH: 31.9 pg (ref 26.0–34.0)
MCHC: 34.3 g/dL (ref 30.0–36.0)
MCV: 93 fL (ref 80.0–100.0)
Platelets: 202 K/uL (ref 150–400)
RBC: 4.58 MIL/uL (ref 4.22–5.81)
RDW: 15.3 % (ref 11.5–15.5)
WBC: 6.6 K/uL (ref 4.0–10.5)
nRBC: 0 % (ref 0.0–0.2)

## 2024-03-09 LAB — SURGICAL PCR SCREEN
MRSA, PCR: NEGATIVE
Staphylococcus aureus: NEGATIVE

## 2024-03-09 NOTE — Progress Notes (Signed)
 PCP - Greig Drones, NP Cardiologist - Revankar, Jennifer SAUNDERS, MD LOV 08/11/23  PPM/ICD - denies Device Orders -  Rep Notified -   Chest x-ray - na EKG - 08/11/23 Stress Test - 08/13/23 ECHO - 08/31/23 Cardiac Cath - denies  Sleep Study - denies CPAP - no  Fasting Blood Sugar - na Checks Blood Sugar _____ times a day  Last dose of GLP1 agonist-  na GLP1 instructions:   Blood Thinner Instructions:na Aspirin Instructions:na  ERAS Protcol -clear liquids until 0415 PRE-SURGERY Ensure or G2- Ensure  COVID TEST- na   Anesthesia review: rash on bilateral posterior forearms. Notified Allison Zelenak,PA-C.Donald notified [name] in Dr. Benjiman office.Hx HTN,murmur,Hyperlipidemia,smoker  Patient denies shortness of breath, fever, cough and chest pain at PAT appointment   All instructions explained to the patient, with a verbal understanding of the material. Patient agrees to go over the instructions while at home for a better understanding.  The opportunity to ask questions was provided.

## 2024-03-11 ENCOUNTER — Other Ambulatory Visit: Payer: Self-pay

## 2024-03-11 ENCOUNTER — Encounter (HOSPITAL_COMMUNITY): Payer: Self-pay

## 2024-03-11 ENCOUNTER — Telehealth: Payer: Self-pay

## 2024-03-11 DIAGNOSIS — M1711 Unilateral primary osteoarthritis, right knee: Secondary | ICD-10-CM

## 2024-03-11 MED ORDER — TRANEXAMIC ACID 1000 MG/10ML IV SOLN
2000.0000 mg | INTRAVENOUS | Status: DC
  Filled 2024-03-11: qty 20

## 2024-03-11 NOTE — Telephone Encounter (Signed)
 Yes please. Thanks.

## 2024-03-11 NOTE — Telephone Encounter (Signed)
 thanks

## 2024-03-11 NOTE — Telephone Encounter (Signed)
 Patient scheduled for TKA on Monday 03/14/2024. Due to insurance, HHPT is not approved. Want me to order outpatient PT?

## 2024-03-11 NOTE — Progress Notes (Signed)
 Anesthesia Chart Review:  Case: [id number] Date/Time: 03/14/24 0700   Procedure: ARTHROPLASTY, KNEE, TOTAL (Right: Knee)   Anesthesia type: Spinal   Diagnosis: Primary osteoarthritis of right knee [M17.11]   Pre-op diagnosis: right knee osteoarthritis   [facility] ROOM 09 / [facility]   Surgeons: Jerri Kay HERO, MD       DISCUSSION: Patient is a 58 year old male scheduled for the above procedure.   History includes smoking, HTN, HLD, prediabetes, inflammatory bowel disease (by 12/28/2023 colonoscopy), osteoarthritis (left TKA 10/26/2023)  He was seen by cardiologist Dr. Edwyna on 08/11/2023 for preoperative evaluation. Echo, stress test, and abdominal aorta duplex ordered. Echo showed normal biventricular function, grade 1 DD, normal valves. Stress was low risk, nonischemic. US  was negative for AAA.   He underwent colonoscopy on 12/28/2023 due to a positive Cologuard test. Results showed:  Colonoscopy 12/28/2023:  Impression: 1.  Two 2 to 3 mm polyps in the ascending colon, removed with a cold snare.  Resected and retrieved. 2.  Left-sided ulcerative colitis (distal 35 to 40 cm).  Moderate active disease. 3.  Internal hemorrhoids.  Per GI follow-up with Dr. Abran on 02/03/2024, On colonoscopy he was found to have 2 diminutive adenomatous colon polyps of the ascending colon which were removed [tubular adenomas, negative for high-grade dysplasia & carcinoma by pathology]. The proximal and mid colon were otherwise normal. He was also noted to have moderate colitis involving the distal 35 to 40 cm of the colon. The endoscopic appearance was consistent with ulcerative colitis. Biopsies were also consistent with chronic active colitis most consistent with IBD. No dysplasia. The patient was not known to have inflammatory bowel disease previously. He was started on a 10 day prednisone  taper and mesalamine  daily. 8 week follow-up planned.   On 03/07/2024 he was evaluated by primary care for 1 week history  of a non-pruritic rash, worse on upper extremities. Mesalamine  was primarily his new medication (although given VEETID for dental pain about a month earlier). Reportedly, drug reaction related to mesalamine  was considered and was held. He was also given triamcinolone  acetonide 40 mg injection. At 03/09/2024 he felt rash was improving, although not completely resolved. Dr. Jerri was notified and will contact patient for additional details and determine if he things surgery should be delayed on not.   03/09/2024 CBC and BMP were normal.  Anesthesia team to evaluate on the day of surgery.   VS: BP 126/86   Pulse 80   Temp 37 C   Resp 18   Ht 6' (1.829 m)   Wt 103.7 kg   SpO2 99%   BMI 31.00 kg/m    PROVIDERS: [name], NP is PCP  Edwyna Backers, MD is cardiologist. Seen on 2/225/2025 for preoperative evaluation due to abnormal EKG and had reassuring testing (see CV).  Abran Rush, MD is GI   LABS: Labs reviewed: Acceptable for surgery. A1c 6.4% on 07/23/2023.  (all labs ordered are listed, but only abnormal results are displayed)  Labs Reviewed  SURGICAL PCR SCREEN  CBC  BASIC METABOLIC PANEL WITH GFR    EKG 08/11/23: Normal sinus rhythm. Rate 92. Left axis deviation. Nonspecific ST abnormality    CV: TTE 08/31/2023:  1. Left ventricular ejection fraction, by estimation, is 60 to 65%. The  left ventricle has normal function. The left ventricle has no regional  wall motion abnormalities. Left ventricular diastolic parameters are  consistent with Grade I diastolic  dysfunction (impaired relaxation).   2. Right  ventricular systolic function is normal. The right ventricular  size is normal.   3. The mitral valve is normal in structure. No evidence of mitral valve  regurgitation. No evidence of mitral stenosis.   4. The aortic valve is normal in structure. Aortic valve regurgitation is  not visualized. No aortic stenosis is present.   5. The inferior vena cava is normal in  size with greater than 50%  respiratory variability, suggesting right atrial pressure of 3 mmHg.    US  Abd Aorta 08/31/2023: Summary:  - Abdominal Aorta: No evidence of an abdominal aortic aneurysm was  visualized. The largest aortic measurement is 2.4 cm.  - Stenosis: Atherosclerosis without focal stenosis.  - IVC/Iliac: IVC is patent.   Nuclear stress 08/13/2023:   The study is normal. The study is low risk.   No ST deviation was noted.   LV perfusion is normal. There is no evidence of ischemia. There is no evidence of infarction.   Left ventricular function is normal. Nuclear stress EF: 58%. The left ventricular ejection fraction is normal (55-65%). End diastolic cavity size is normal. End systolic cavity size is normal.    Past Medical History:  Diagnosis Date   Abnormal EKG    Chronic bilateral low back pain without sciatica 03/13/2022   Essential (primary) hypertension 09/26/2021   Hyperlipidemia 10/17/2021   Hypertension    Inflammatory bowel disease    by 12/28/2023 colonoscopy   Nerve damage    Neuropathy    Osteoarthritis 11/03/2011   Osteoarthritis of knees, bilateral 06/13/2022   Paresthesia 03/13/2022   Prediabetes 10/17/2021    Past Surgical History:  Procedure Laterality Date   FINGER SURGERY Left    As a child. Tip of middle finger cut off in wood shop   TOTAL KNEE ARTHROPLASTY Left 10/26/2023   Procedure: ARTHROPLASTY, KNEE, TOTAL;  Surgeon: Jerri Kay HERO, MD;  Location: [facility];  Service: Orthopedics;  Laterality: Left;    MEDICATIONS:  amLODipine  (NORVASC ) 10 MG tablet   Ascorbic Acid (VITAMIN C) 1000 MG tablet   atorvastatin  (LIPITOR) 40 MG tablet   Cholecalciferol 25 MCG (1000 UT) capsule   docusate sodium  (COLACE) 100 MG capsule   gabapentin  (NEURONTIN ) 800 MG tablet   HYDROcodone -acetaminophen  (NORCO/VICODIN) 5-325 MG tablet   ibuprofen  (ADVIL ) 800 MG tablet   meloxicam  (MOBIC ) 7.5 MG tablet   mesalamine  (LIALDA ) 1.2 g EC tablet   methocarbamol   (ROBAXIN ) 750 MG tablet   mupirocin  ointment (BACTROBAN ) 2 %   ondansetron  (ZOFRAN ) 4 MG tablet   ondansetron  (ZOFRAN ) 4 MG tablet   oxyCODONE -acetaminophen  (PERCOCET) 5-325 MG tablet   silver  sulfADIAZINE  (SILVADENE ) 1 % cream   traMADol  (ULTRAM ) 50 MG tablet   No current facility-administered medications for this encounter.   By medication list, he is not currently taking Colace, Norco, Mobic , Robaxin , bacitracin ointment, Zofran , Percocet, Silvadene  cream, tramadol , or mesalamine    Isaiah Ruder, PA-C Surgical Short Stay/Anesthesiology [facility] Phone [phone] [facility] Phone [phone] 03/11/2024 10:44 AM

## 2024-03-11 NOTE — Anesthesia Preprocedure Evaluation (Addendum)
 Anesthesia Evaluation  Patient identified by MRN, date of birth, ID band Patient awake    Reviewed: Allergy & Precautions, H&P , NPO status , Patient's Chart, lab work & pertinent test results  Airway Mallampati: II   Neck ROM: full    Dental   Pulmonary Current Smoker and Patient abstained from smoking.   breath sounds clear to auscultation       Cardiovascular hypertension,  Rhythm:regular Rate:Normal     Neuro/Psych  Neuromuscular disease    GI/Hepatic   Endo/Other    Renal/GU      Musculoskeletal  (+) Arthritis ,    Abdominal   Peds  Hematology   Anesthesia Other Findings   Reproductive/Obstetrics                              Anesthesia Physical Anesthesia Plan  ASA: 2  Anesthesia Plan: MAC and Spinal   Post-op Pain Management: Regional block*   Induction: Intravenous  PONV Risk Score and Plan: 0 and Propofol  infusion, Midazolam  and Treatment may vary due to age or medical condition  Airway Management Planned: Simple Face Mask  Additional Equipment:   Intra-op Plan:   Post-operative Plan:   Informed Consent: I have reviewed the patients History and Physical, chart, labs and discussed the procedure including the risks, benefits and alternatives for the proposed anesthesia with the patient or authorized representative who has indicated his/her understanding and acceptance.     Dental advisory given  Plan Discussed with: CRNA, Anesthesiologist and Surgeon  Anesthesia Plan Comments: (PAT note written 03/11/2024 by Allison Zelenak, PA-C.  )         Anesthesia Quick Evaluation

## 2024-03-11 NOTE — Telephone Encounter (Signed)
 PT on [address] has been ordered.

## 2024-03-14 ENCOUNTER — Observation Stay (HOSPITAL_COMMUNITY)

## 2024-03-14 ENCOUNTER — Encounter (HOSPITAL_COMMUNITY)
Admission: RE | Disposition: A | Discharge status: Home / Self Care | Payer: Self-pay | Source: Home / Self Care | Attending: Orthopaedic Surgery

## 2024-03-14 ENCOUNTER — Observation Stay (HOSPITAL_COMMUNITY)
Admission: RE | Admit: 2024-03-14 | Discharge: 2024-03-15 | Disposition: A | Discharge status: Home / Self Care | Attending: Orthopaedic Surgery | Admitting: Orthopaedic Surgery

## 2024-03-14 ENCOUNTER — Ambulatory Visit (HOSPITAL_COMMUNITY): Admitting: Anesthesiology

## 2024-03-14 ENCOUNTER — Other Ambulatory Visit: Payer: Self-pay

## 2024-03-14 ENCOUNTER — Encounter (HOSPITAL_COMMUNITY): Payer: Self-pay | Admitting: Orthopaedic Surgery

## 2024-03-14 ENCOUNTER — Other Ambulatory Visit: Payer: Self-pay | Admitting: Physician Assistant

## 2024-03-14 ENCOUNTER — Other Ambulatory Visit (HOSPITAL_COMMUNITY): Payer: Self-pay

## 2024-03-14 ENCOUNTER — Ambulatory Visit (HOSPITAL_COMMUNITY): Payer: Self-pay | Admitting: Vascular Surgery

## 2024-03-14 DIAGNOSIS — I10 Essential (primary) hypertension: Secondary | ICD-10-CM | POA: Diagnosis not present

## 2024-03-14 DIAGNOSIS — M24561 Contracture, right knee: Secondary | ICD-10-CM | POA: Diagnosis not present

## 2024-03-14 DIAGNOSIS — M65161 Other infective (teno)synovitis, right knee: Secondary | ICD-10-CM | POA: Diagnosis not present

## 2024-03-14 DIAGNOSIS — M94261 Chondromalacia, right knee: Secondary | ICD-10-CM | POA: Insufficient documentation

## 2024-03-14 DIAGNOSIS — F17210 Nicotine dependence, cigarettes, uncomplicated: Secondary | ICD-10-CM | POA: Insufficient documentation

## 2024-03-14 DIAGNOSIS — M21161 Varus deformity, not elsewhere classified, right knee: Secondary | ICD-10-CM | POA: Diagnosis not present

## 2024-03-14 DIAGNOSIS — Z96652 Presence of left artificial knee joint: Secondary | ICD-10-CM | POA: Diagnosis not present

## 2024-03-14 DIAGNOSIS — Z96651 Presence of right artificial knee joint: Secondary | ICD-10-CM

## 2024-03-14 DIAGNOSIS — Z79899 Other long term (current) drug therapy: Secondary | ICD-10-CM | POA: Diagnosis not present

## 2024-03-14 DIAGNOSIS — M1711 Unilateral primary osteoarthritis, right knee: Secondary | ICD-10-CM

## 2024-03-14 HISTORY — PX: TOTAL KNEE ARTHROPLASTY: SHX125

## 2024-03-14 SURGERY — ARTHROPLASTY, KNEE, TOTAL
Anesthesia: Monitor Anesthesia Care | Site: Knee | Laterality: Right

## 2024-03-14 MED ORDER — VANCOMYCIN HCL 1000 MG IV SOLR
INTRAVENOUS | Status: AC
  Filled 2024-03-14: qty 20

## 2024-03-14 MED ORDER — FENTANYL CITRATE (PF) 100 MCG/2ML IJ SOLN
INTRAMUSCULAR | Status: AC
  Filled 2024-03-14: qty 2

## 2024-03-14 MED ORDER — AMLODIPINE BESYLATE 10 MG PO TABS
10.0000 mg | ORAL_TABLET | Freq: Every day | ORAL | Status: DC
  Administered 2024-03-15: 10 mg via ORAL
  Filled 2024-03-14: qty 1

## 2024-03-14 MED ORDER — PRONTOSAN WOUND IRRIGATION OPTIME
TOPICAL | Status: DC | PRN
  Administered 2024-03-14: 1 via TOPICAL

## 2024-03-14 MED ORDER — PROPOFOL 1000 MG/100ML IV EMUL
INTRAVENOUS | Status: AC
  Filled 2024-03-14: qty 100

## 2024-03-14 MED ORDER — 0.9 % SODIUM CHLORIDE (POUR BTL) OPTIME
TOPICAL | Status: DC | PRN
  Administered 2024-03-14: 1000 mL

## 2024-03-14 MED ORDER — ONDANSETRON HCL 4 MG/2ML IJ SOLN: 4.0000 mg | Freq: Four times a day (QID) | INTRAMUSCULAR | Status: DC | PRN

## 2024-03-14 MED ORDER — BUPIVACAINE IN DEXTROSE 0.75-8.25 % IT SOLN
INTRATHECAL | Status: DC | PRN
Start: 2024-03-14 — End: 2024-03-14
  Administered 2024-03-14: 2 mL via INTRATHECAL

## 2024-03-14 MED ORDER — MIDAZOLAM HCL 2 MG/2ML IJ SOLN
INTRAMUSCULAR | Status: DC | PRN
Start: 2024-03-14 — End: 2024-03-14
  Administered 2024-03-14: 1 mg via INTRAVENOUS
  Administered 2024-03-14: 2 mg via INTRAVENOUS

## 2024-03-14 MED ORDER — PHENOL 1.4 % MT LIQD: 1.0000 | OROMUCOSAL | Status: DC | PRN

## 2024-03-14 MED ORDER — OXYCODONE HCL 5 MG PO TABS
ORAL_TABLET | ORAL | Status: AC
  Filled 2024-03-14: qty 2

## 2024-03-14 MED ORDER — DEXAMETHASONE SODIUM PHOSPHATE 10 MG/ML IJ SOLN
10.0000 mg | Freq: Once | INTRAMUSCULAR | Status: AC
  Administered 2024-03-15: 10 mg via INTRAVENOUS
  Filled 2024-03-14: qty 1

## 2024-03-14 MED ORDER — GABAPENTIN 400 MG PO CAPS
800.0000 mg | ORAL_CAPSULE | Freq: Two times a day (BID) | ORAL | Status: DC
Start: 2024-03-14 — End: 2024-03-15
  Administered 2024-03-14 – 2024-03-15 (×2): 800 mg via ORAL
  Filled 2024-03-14 (×2): qty 2

## 2024-03-14 MED ORDER — CEFAZOLIN SODIUM-DEXTROSE 2-4 GM/100ML-% IV SOLN
2.0000 g | INTRAVENOUS | Status: AC
  Administered 2024-03-14: 2 g via INTRAVENOUS
  Filled 2024-03-14: qty 100

## 2024-03-14 MED ORDER — LACTATED RINGERS IV SOLN
INTRAVENOUS | Status: DC
Start: 2024-03-14 — End: 2024-03-14

## 2024-03-14 MED ORDER — METHOCARBAMOL 1000 MG/10ML IJ SOLN: 500.0000 mg | Freq: Four times a day (QID) | INTRAMUSCULAR | Status: DC | PRN

## 2024-03-14 MED ORDER — MENTHOL 3 MG MT LOZG: 1.0000 | LOZENGE | OROMUCOSAL | Status: DC | PRN

## 2024-03-14 MED ORDER — MIDAZOLAM HCL 2 MG/2ML IJ SOLN
INTRAMUSCULAR | Status: AC
  Filled 2024-03-14: qty 2

## 2024-03-14 MED ORDER — LIDOCAINE 2% (20 MG/ML) 5 ML SYRINGE
INTRAMUSCULAR | Status: DC | PRN
  Administered 2024-03-14: 20 mg via INTRAVENOUS

## 2024-03-14 MED ORDER — ONDANSETRON HCL 4 MG PO TABS: 4.0000 mg | ORAL_TABLET | Freq: Four times a day (QID) | ORAL | Status: DC | PRN

## 2024-03-14 MED ORDER — TRANEXAMIC ACID-NACL 1000-0.7 MG/100ML-% IV SOLN
1000.0000 mg | Freq: Once | INTRAVENOUS | Status: AC
Start: 2024-03-14 — End: 2024-03-14
  Administered 2024-03-14: 1000 mg via INTRAVENOUS
  Filled 2024-03-14: qty 100

## 2024-03-14 MED ORDER — METOCLOPRAMIDE HCL 5 MG/ML IJ SOLN: 5.0000 mg | Freq: Three times a day (TID) | INTRAMUSCULAR | Status: DC | PRN

## 2024-03-14 MED ORDER — ACETAMINOPHEN 500 MG PO TABS
1000.0000 mg | ORAL_TABLET | Freq: Four times a day (QID) | ORAL | Status: AC
  Administered 2024-03-14 – 2024-03-15 (×4): 1000 mg via ORAL
  Filled 2024-03-14 (×4): qty 2

## 2024-03-14 MED ORDER — GLYCOPYRROLATE PF 0.2 MG/ML IJ SOSY
PREFILLED_SYRINGE | INTRAMUSCULAR | Status: DC | PRN
  Administered 2024-03-14: .1 mg via INTRAVENOUS

## 2024-03-14 MED ORDER — CEFAZOLIN SODIUM-DEXTROSE 2-4 GM/100ML-% IV SOLN
2.0000 g | Freq: Four times a day (QID) | INTRAVENOUS | Status: AC
  Administered 2024-03-14 (×2): 2 g via INTRAVENOUS
  Filled 2024-03-14 (×2): qty 100

## 2024-03-14 MED ORDER — VANCOMYCIN HCL 1000 MG IV SOLR
INTRAVENOUS | Status: DC | PRN
  Administered 2024-03-14: 1000 mg via TOPICAL

## 2024-03-14 MED ORDER — METHOCARBAMOL 500 MG PO TABS: 500.0000 mg | ORAL_TABLET | Freq: Four times a day (QID) | ORAL | Status: DC | PRN

## 2024-03-14 MED ORDER — APIXABAN 2.5 MG PO TABS
2.5000 mg | ORAL_TABLET | Freq: Two times a day (BID) | ORAL | 0 refills | Status: DC
  Filled 2024-03-14: qty 60, 30d supply, fill #0

## 2024-03-14 MED ORDER — OXYCODONE HCL 5 MG PO TABS: 5.0000 mg | ORAL_TABLET | Freq: Once | ORAL | Status: DC | PRN

## 2024-03-14 MED ORDER — ACETAMINOPHEN 325 MG PO TABS: 325.0000 mg | ORAL_TABLET | Freq: Four times a day (QID) | ORAL | Status: DC | PRN

## 2024-03-14 MED ORDER — KETOROLAC TROMETHAMINE 15 MG/ML IJ SOLN
7.5000 mg | Freq: Four times a day (QID) | INTRAMUSCULAR | Status: AC
  Administered 2024-03-14 – 2024-03-15 (×3): 7.5 mg via INTRAVENOUS
  Filled 2024-03-14 (×3): qty 1

## 2024-03-14 MED ORDER — PROPOFOL 10 MG/ML IV BOLUS
INTRAVENOUS | Status: DC | PRN
Start: 2024-03-14 — End: 2024-03-14
  Administered 2024-03-14: 20 mg via INTRAVENOUS

## 2024-03-14 MED ORDER — SODIUM CHLORIDE 0.9 % IR SOLN
Status: DC | PRN
  Administered 2024-03-14: 3000 mL

## 2024-03-14 MED ORDER — SODIUM CHLORIDE 0.9 % IV SOLN: INTRAVENOUS | Status: DC

## 2024-03-14 MED ORDER — DOCUSATE SODIUM 100 MG PO CAPS
100.0000 mg | ORAL_CAPSULE | Freq: Two times a day (BID) | ORAL | Status: DC
  Administered 2024-03-14 – 2024-03-15 (×3): 100 mg via ORAL
  Filled 2024-03-14 (×3): qty 1

## 2024-03-14 MED ORDER — HYDROMORPHONE HCL 1 MG/ML IJ SOLN
1.0000 mg | Freq: Three times a day (TID) | INTRAMUSCULAR | Status: DC | PRN
  Administered 2024-03-14: 1 mg via INTRAVENOUS
  Filled 2024-03-14: qty 1

## 2024-03-14 MED ORDER — DEXAMETHASONE SODIUM PHOSPHATE 10 MG/ML IJ SOLN
INTRAMUSCULAR | Status: DC | PRN
  Administered 2024-03-14: 10 mg via INTRAVENOUS

## 2024-03-14 MED ORDER — FENTANYL CITRATE (PF) 250 MCG/5ML IJ SOLN
INTRAMUSCULAR | Status: AC
  Filled 2024-03-14: qty 5

## 2024-03-14 MED ORDER — CHLORHEXIDINE GLUCONATE 0.12 % MT SOLN
15.0000 mL | Freq: Once | OROMUCOSAL | Status: AC
  Administered 2024-03-14: 15 mL via OROMUCOSAL
  Filled 2024-03-14: qty 15

## 2024-03-14 MED ORDER — PROPOFOL 10 MG/ML IV BOLUS
INTRAVENOUS | Status: AC
  Filled 2024-03-14: qty 20

## 2024-03-14 MED ORDER — PHENYLEPHRINE HCL-NACL 20-0.9 MG/250ML-% IV SOLN
INTRAVENOUS | Status: DC | PRN
  Administered 2024-03-14: 25 ug/min via INTRAVENOUS

## 2024-03-14 MED ORDER — ROPIVACAINE HCL 5 MG/ML IJ SOLN
INTRAMUSCULAR | Status: DC | PRN
  Administered 2024-03-14: 25 mL via PERINEURAL

## 2024-03-14 MED ORDER — BUPIVACAINE-MELOXICAM ER 400-12 MG/14ML IJ SOLN
INTRAMUSCULAR | Status: AC
  Filled 2024-03-14: qty 1

## 2024-03-14 MED ORDER — FENTANYL CITRATE (PF) 100 MCG/2ML IJ SOLN
INTRAMUSCULAR | Status: DC | PRN
  Administered 2024-03-14 (×2): 50 ug via INTRAVENOUS

## 2024-03-14 MED ORDER — FENTANYL CITRATE (PF) 100 MCG/2ML IJ SOLN
25.0000 ug | INTRAMUSCULAR | Status: DC | PRN
  Administered 2024-03-14 (×3): 50 ug via INTRAVENOUS

## 2024-03-14 MED ORDER — PROPOFOL 500 MG/50ML IV EMUL
INTRAVENOUS | Status: DC | PRN
  Administered 2024-03-14: 100 ug/kg/min via INTRAVENOUS

## 2024-03-14 MED ORDER — TRANEXAMIC ACID 1000 MG/10ML IV SOLN
INTRAVENOUS | Status: DC | PRN
  Administered 2024-03-14: 2000 mg via TOPICAL

## 2024-03-14 MED ORDER — OXYCODONE HCL 5 MG PO TABS
10.0000 mg | ORAL_TABLET | Freq: Four times a day (QID) | ORAL | Status: DC | PRN
  Administered 2024-03-14 – 2024-03-15 (×2): 10 mg via ORAL
  Filled 2024-03-14: qty 2

## 2024-03-14 MED ORDER — METOCLOPRAMIDE HCL 5 MG PO TABS: 5.0000 mg | ORAL_TABLET | Freq: Three times a day (TID) | ORAL | Status: DC | PRN

## 2024-03-14 MED ORDER — BUPIVACAINE-MELOXICAM ER 400-12 MG/14ML IJ SOLN
INTRAMUSCULAR | Status: DC | PRN
  Administered 2024-03-14: 400 mg

## 2024-03-14 MED ORDER — ORAL CARE MOUTH RINSE: 15.0000 mL | Freq: Once | OROMUCOSAL | Status: AC

## 2024-03-14 MED ORDER — LACTATED RINGERS IV SOLN: INTRAVENOUS | Status: DC

## 2024-03-14 MED ORDER — OXYCODONE HCL 5 MG PO TABS
5.0000 mg | ORAL_TABLET | Freq: Four times a day (QID) | ORAL | Status: DC | PRN
  Administered 2024-03-14: 5 mg via ORAL
  Filled 2024-03-14: qty 1

## 2024-03-14 MED ORDER — APIXABAN 2.5 MG PO TABS
2.5000 mg | ORAL_TABLET | Freq: Two times a day (BID) | ORAL | Status: DC
  Administered 2024-03-15: 2.5 mg via ORAL
  Filled 2024-03-14: qty 1

## 2024-03-14 MED ORDER — POVIDONE-IODINE 10 % EX SWAB
2.0000 | Freq: Once | CUTANEOUS | Status: AC
  Administered 2024-03-14: 2 via TOPICAL

## 2024-03-14 MED ORDER — ONDANSETRON HCL 4 MG/2ML IJ SOLN
INTRAMUSCULAR | Status: DC | PRN
  Administered 2024-03-14: 4 mg via INTRAVENOUS

## 2024-03-14 MED ORDER — OXYCODONE HCL 5 MG/5ML PO SOLN: 5.0000 mg | Freq: Once | ORAL | Status: DC | PRN

## 2024-03-14 MED ORDER — KETOROLAC TROMETHAMINE 15 MG/ML IJ SOLN
INTRAMUSCULAR | Status: AC
  Filled 2024-03-14: qty 1

## 2024-03-14 MED ORDER — TRANEXAMIC ACID-NACL 1000-0.7 MG/100ML-% IV SOLN
1000.0000 mg | INTRAVENOUS | Status: AC
  Administered 2024-03-14: 1000 mg via INTRAVENOUS
  Filled 2024-03-14: qty 100

## 2024-03-14 SURGICAL SUPPLY — 68 items
ALCOHOL 70% 16 OZ (MISCELLANEOUS) ×1 IMPLANT
BAG COUNTER SPONGE SURGICOUNT (BAG) IMPLANT
BAG DECANTER FOR FLEXI CONT (MISCELLANEOUS) ×1 IMPLANT
BANDAGE ESMARK 6X9 LF (GAUZE/BANDAGES/DRESSINGS) IMPLANT
BLADE SAG 18X100X1.27 (BLADE) ×1 IMPLANT
BLADE SAW SAG 90X13X1.27 (BLADE) ×1 IMPLANT
BLADE SAW SGTL 73X25 THK (BLADE) ×1 IMPLANT
BOWL SMART MIX CTS (DISPOSABLE) IMPLANT
CLSR STERI-STRIP ANTIMIC 1/2X4 (GAUZE/BANDAGES/DRESSINGS) IMPLANT
COMPONENT FEM STD PS KN CR12RT (Joint) IMPLANT
COMPONENT PATELLA 3 PEG 38 (Joint) IMPLANT
COMPONENT TIB KNEE PS G 0 RT (Joint) IMPLANT
COOLER ICEMAN CLASSIC (MISCELLANEOUS) ×1 IMPLANT
COVER SURGICAL LIGHT HANDLE (MISCELLANEOUS) ×1 IMPLANT
CUFF TOURN SGL QUICK 42 (TOURNIQUET CUFF) IMPLANT
CUFF TRNQT CYL 34X4.125X (TOURNIQUET CUFF) ×1 IMPLANT
DERMABOND ADVANCED .7 DNX12 (GAUZE/BANDAGES/DRESSINGS) ×1 IMPLANT
DRAPE EXTREMITY T 121X128X90 (DISPOSABLE) ×1 IMPLANT
DRAPE HALF SHEET 40X57 (DRAPES) ×1 IMPLANT
DRAPE INCISE IOBAN 66X45 STRL (DRAPES) ×1 IMPLANT
DRAPE POUCH INSTRU U-SHP 10X18 (DRAPES) ×1 IMPLANT
DRAPE SURG ORHT 6 SPLT 77X108 (DRAPES) IMPLANT
DRAPE U-SHAPE 47X51 STRL (DRAPES) ×2 IMPLANT
DRSG AQUACEL AG ADV 3.5X10 (GAUZE/BANDAGES/DRESSINGS) ×1 IMPLANT
DURAPREP 26ML APPLICATOR (WOUND CARE) ×3 IMPLANT
ELECT CAUTERY BLADE 6.4 (BLADE) ×1 IMPLANT
ELECT PENCIL ROCKER SW 15FT (MISCELLANEOUS) ×1 IMPLANT
ELECTRODE REM PT RTRN 9FT ADLT (ELECTROSURGICAL) ×1 IMPLANT
GLOVE BIOGEL PI IND STRL 7.0 (GLOVE) ×2 IMPLANT
GLOVE BIOGEL PI IND STRL 7.5 (GLOVE) ×5 IMPLANT
GLOVE ECLIPSE 7.0 STRL STRAW (GLOVE) ×3 IMPLANT
GLOVE SURG SYN 7.5 PF PI (GLOVE) ×2 IMPLANT
GLOVE SURG UNDER LTX SZ7.5 (GLOVE) ×2 IMPLANT
GOWN STRL REUS W/ TWL LRG LVL3 (GOWN DISPOSABLE) ×1 IMPLANT
GOWN STRL SURGICAL XL XLNG (GOWN DISPOSABLE) IMPLANT
GOWN TOGA ZIPPER T7+ PEEL AWAY (MISCELLANEOUS) ×1 IMPLANT
HOOD PEEL AWAY T7 (MISCELLANEOUS) ×1 IMPLANT
KIT BASIN OR (CUSTOM PROCEDURE TRAY) ×1 IMPLANT
KIT TURNOVER KIT B (KITS) ×1 IMPLANT
LINER TIB PS RM GH/12 16 RT (Liner) IMPLANT
MANIFOLD NEPTUNE II (INSTRUMENTS) ×1 IMPLANT
MARKER SKIN DUAL TIP RULER LAB (MISCELLANEOUS) ×2 IMPLANT
NDL SPNL 18GX3.5 QUINCKE PK (NEEDLE) ×1 IMPLANT
NEEDLE SPNL 18GX3.5 QUINCKE PK (NEEDLE) ×1 IMPLANT
PACK TOTAL JOINT (CUSTOM PROCEDURE TRAY) ×1 IMPLANT
PAD ARMBOARD POSITIONER FOAM (MISCELLANEOUS) ×2 IMPLANT
PAD COLD SHLDR WRAP-ON (PAD) ×1 IMPLANT
PIN DRILL HDLS TROCAR 75 4PK (PIN) IMPLANT
SCREW FEMALE HEX FIX 25X2.5 (ORTHOPEDIC DISPOSABLE SUPPLIES) IMPLANT
SET HNDPC FAN SPRY TIP SCT (DISPOSABLE) ×1 IMPLANT
SOLN 0.9% NACL 1000 ML (IV SOLUTION) ×1 IMPLANT
SOLN 0.9% NACL POUR BTL 1000ML (IV SOLUTION) ×1 IMPLANT
SOLUTION PRONTOSAN WOUND 350ML (IRRIGATION / IRRIGATOR) ×1 IMPLANT
STAPLER SKIN PROX 35W (STAPLE) IMPLANT
SUCTION TUBE FRAZIER 10FR DISP (SUCTIONS) IMPLANT
SUT ETHILON 2 0 FS 18 (SUTURE) IMPLANT
SUT MNCRL AB 3-0 PS2 27 (SUTURE) IMPLANT
SUT STRATAFIX PDS+ 0 24IN (SUTURE) IMPLANT
SUT VIC AB 0 CT1 27XBRD ANBCTR (SUTURE) ×2 IMPLANT
SUT VIC AB 1 CTX 27 (SUTURE) ×3 IMPLANT
SUT VIC AB 2-0 CT1 TAPERPNT 27 (SUTURE) ×4 IMPLANT
SYR 50ML LL SCALE MARK (SYRINGE) ×2 IMPLANT
TOWEL GREEN STERILE (TOWEL DISPOSABLE) ×1 IMPLANT
TOWEL GREEN STERILE FF (TOWEL DISPOSABLE) ×1 IMPLANT
TRAY CATH INTERMITTENT SS 16FR (CATHETERS) IMPLANT
TUBE SUCT ARGYLE STRL (TUBING) ×1 IMPLANT
UNDERPAD 30X36 HEAVY ABSORB (UNDERPADS AND DIAPERS) ×1 IMPLANT
YANKAUER SUCT BULB TIP NO VENT (SUCTIONS) ×1 IMPLANT

## 2024-03-14 NOTE — Anesthesia Procedure Notes (Signed)
 Spinal  Patient location during procedure: OR Start time: 03/14/2024 7:32 AM End time: 03/14/2024 7:34 AM Reason for block: surgical anesthesia Staffing Performed: anesthesiologist  Anesthesiologist: Maryclare Cornet, MD Performed by: Maryclare Cornet, MD Authorized by: Maryclare Cornet, MD   Preanesthetic Checklist Completed: patient identified, IV checked, risks and benefits discussed, surgical consent, monitors and equipment checked, pre-op evaluation and timeout performed Spinal Block Patient position: sitting Prep: DuraPrep Patient monitoring: cardiac monitor, continuous pulse ox and blood pressure Approach: midline Location: L3-4 Injection technique: single-shot Needle Needle type: Pencan  Needle gauge: 24 G Needle length: 9 cm Assessment Sensory level: T10 Events: CSF return Additional Notes Functioning IV was confirmed and monitors were applied. Sterile prep and drape, including hand hygiene and sterile gloves were used. The patient was positioned and the spine was prepped. The skin was anesthetized with lidocaine .  Free flow of clear CSF was obtained prior to injecting local anesthetic into the CSF.  The spinal needle aspirated freely following injection.  The needle was carefully withdrawn.  The patient tolerated the procedure well.

## 2024-03-14 NOTE — H&P (Signed)
 PREOPERATIVE H&P  Chief Complaint: right knee osteoarthritis  HPI: Justin Burnett is a 58 y.o. male who presents for surgical treatment of right knee osteoarthritis.  He denies any changes in medical history.  Past Surgical History:  Procedure Laterality Date  . FINGER SURGERY Left    As a child. Tip of middle finger cut off in wood shop  . TOTAL KNEE ARTHROPLASTY Left 10/26/2023   Procedure: ARTHROPLASTY, KNEE, TOTAL;  Surgeon: Jerri Kay HERO, MD;  Location: [facility];  Service: Orthopedics;  Laterality: Left;   Social History   Socioeconomic History  . Marital status: Married    Spouse name: [name]   . Number of children: 1  . Years of education: GED  . Highest education level: Not on file  Occupational History  . Not on file  Tobacco Use  . Smoking status: Every Day    Current packs/day: 0.50    Average packs/day: 0.5 packs/day for 13.0 years (6.5 ttl pk-yrs)    Types: Cigarettes    Passive exposure: Current  . Smokeless tobacco: Never  Vaping Use  . Vaping status: Never Used  Substance and Sexual Activity  . Alcohol use: Not Currently    Alcohol/week: 3.0 standard drinks of alcohol    Types: 3 Cans of beer per week    Comment: 1 pint liquer per week  . Drug use: No  . Sexual activity: Not Currently  Other Topics Concern  . Not on file  Social History Narrative   Patient lives at home with his wife Environmental education officer).   Patient is unemployed   Engineer, drilling. School.   Right handed.    Caffeine coffee and mountain dew.   Social Drivers of Corporate investment banker Strain: Low Risk  (01/20/2024)   Overall Financial Resource Strain (CARDIA)   . Difficulty of Paying Living Expenses: Not hard at all  Food Insecurity: No Food Insecurity (01/20/2024)   Hunger Vital Sign   . Worried About Programme researcher, broadcasting/film/video in the Last Year: Never true   . Ran Out of Food in the Last Year: Never true  Transportation Needs: No Transportation Needs (01/20/2024)   PRAPARE - Transportation    . Lack of Transportation (Medical): No   . Lack of Transportation (Non-Medical): No  Physical Activity: Sufficiently Active (07/23/2023)   Exercise Vital Sign   . Days of Exercise per Week: 4 days   . Minutes of Exercise per Session: 60 min  Stress: No Stress Concern Present (01/20/2024)   [employer] of Occupational Health - Occupational Stress Questionnaire   . Feeling of Stress: Not at all  Social Connections: Moderately Isolated (01/20/2024)   Social Connection and Isolation Panel   . Frequency of Communication with Friends and Family: More than three times a week   . Frequency of Social Gatherings with Friends and Family: More than three times a week   . Attends Religious Services: Never   . Active Member of Clubs or Organizations: No   . Attends Banker Meetings: Never   . Marital Status: Married   Family History  Problem Relation Age of Onset  . Colon cancer Neg Hx   . Esophageal cancer Neg Hx   . Rectal cancer Neg Hx   . Stomach cancer Neg Hx   . Colon polyps Neg Hx    Allergies  Allergen Reactions  . Meperidine Other (See Comments)    Causes seizure-like activities Demerol  . Meperidine Hcl Other (  See Comments)   Prior to Admission medications   Medication Sig Start Date End Date Taking? Authorizing Provider  amLODipine  (NORVASC ) 10 MG tablet Take 1 tablet (10 mg total) by mouth daily. 01/15/24  Yes Lorren, Amy J, NP  Ascorbic Acid (VITAMIN C) 1000 MG tablet Take 1,000 mg by mouth daily.   Yes [provider]  atorvastatin  (LIPITOR) 40 MG tablet Take 1 tablet (40 mg total) by mouth daily. 01/15/24  Yes Lorren, Amy J, NP  Cholecalciferol 25 MCG (1000 UT) capsule Take 1,000 Units by mouth daily.   Yes [provider]  gabapentin  (NEURONTIN ) 800 MG tablet Take 1 tablet (800 mg total) by mouth 2 (two) times daily. 01/20/24  Yes Lorren, Amy J, NP  ibuprofen  (ADVIL ) 800 MG tablet Take 1 tablet (800 mg total) by mouth every 8 (eight) hours  as needed. 02/23/24  Yes Lorren, Amy J, NP  docusate sodium  (COLACE) 100 MG capsule Take 1 capsule (100 mg total) by mouth daily as needed. Patient not taking: Reported on 03/08/2024 03/02/24 03/02/25  Jule Ronal CROME, PA-C  HYDROcodone -acetaminophen  (NORCO/VICODIN) 5-325 MG tablet Take 1 tablet by mouth 2 (two) times daily as needed for moderate pain (pain score 4-6). Patient not taking: Reported on 03/08/2024 12/08/23   Jule Ronal CROME, PA-C  meloxicam  (MOBIC ) 7.5 MG tablet Take 1 tablet (7.5 mg total) by mouth 2 (two) times daily as needed for pain. Patient not taking: No sig reported 12/15/23   Jerri Kay HERO, MD  mesalamine  (LIALDA ) 1.2 g EC tablet Take 4 tablets (4.8 g total) by mouth daily with breakfast. Patient not taking: Reported on 03/08/2024 02/03/24   Abran Norleen SAILOR, MD  methocarbamol  (ROBAXIN ) 750 MG tablet Take 1 tablet (750 mg total) by mouth 3 (three) times daily as needed for muscle spasms. Patient not taking: Reported on 03/08/2024 03/02/24   Jule Ronal CROME, PA-C  mupirocin  ointment (BACTROBAN ) 2 % Apply 1 Application topically 2 (two) times daily. Patient not taking: No sig reported 01/20/24   [name], NP  ondansetron  (ZOFRAN ) 4 MG tablet Take 1 tablet (4 mg total) by mouth every 8 (eight) hours as needed for nausea or vomiting. Patient not taking: Reported on 03/08/2024 10/15/23   Jule Ronal CROME, PA-C  ondansetron  (ZOFRAN ) 4 MG tablet Take 1 tablet (4 mg total) by mouth every 8 (eight) hours as needed for nausea or vomiting. 03/02/24   Jule Ronal CROME, PA-C  oxyCODONE -acetaminophen  (PERCOCET) 5-325 MG tablet Take 1-2 tablets by mouth every 8 (eight) hours as needed. (to be taken after surgery) 03/02/24   Jule Ronal CROME, PA-C  silver  sulfADIAZINE  (SILVADENE ) 1 % cream Apply 1 Application topically 2 (two) times daily. Patient not taking: Reported on 03/08/2024 03/25/23   [name], NP  traMADol  (ULTRAM ) 50 MG tablet Take 1-2 tablets (50-100 mg total) by mouth daily as  needed. Patient not taking: Reported on 03/08/2024 12/15/23   Jerri Kay HERO, MD     Positive ROS: All other systems have been reviewed and were otherwise negative with the exception of those mentioned in the HPI and as above.  Physical Exam: General: Alert, no acute distress Cardiovascular: No pedal edema Respiratory: No cyanosis, no use of accessory musculature GI: abdomen soft Skin: No lesions in the area of chief complaint Neurologic: Sensation intact distally Psychiatric: Patient is competent for consent with normal mood and affect Lymphatic: no lymphedema  MUSCULOSKELETAL: exam stable  Assessment: right knee osteoarthritis  Plan: Plan for Procedure(s): ARTHROPLASTY,  KNEE, TOTAL  The risks benefits and alternatives were discussed with the patient including but not limited to the risks of nonoperative treatment, versus surgical intervention including infection, bleeding, nerve injury,  blood clots, cardiopulmonary complications, morbidity, mortality, among others, and they were willing to proceed.   Ozell Cummins, MD 03/14/2024 5:51 AM

## 2024-03-14 NOTE — Evaluation (Signed)
 Physical Therapy Evaluation Patient Details Name: Justin Burnett MRN: [id number] DOB: [name] 24, 1966 Today's Date: 03/14/2024  History of Present Illness  58 yo M adm 9/29 for Rt TKA. PMhx: OA, neuropathy, HLD, HTN, neuromuscular disorder, Lt TKA  Clinical Impression  Pt presents with admitting diagnosis above. Pt today was able to ambulate short distance in hallway with RW CGA/Min A. Pt reports prior to admission he was ambulating with a RW since his previous TKA in May. Follow physician recommends for DC plan. PT will continue to follow.          If plan is discharge home, recommend the following: A little help with walking and/or transfers;A little help with bathing/dressing/bathroom;Help with stairs or ramp for entrance;Assist for transportation;Assistance with cooking/housework   Can travel by private vehicle        Equipment Recommendations BSC/3in1  Recommendations for Other Services       Functional Status Assessment Patient has had a recent decline in their functional status and demonstrates the ability to make significant improvements in function in a reasonable and predictable amount of time.     Precautions / Restrictions Precautions Precautions: Fall;Knee Precaution Booklet Issued: Yes (comment) Restrictions Weight Bearing Restrictions Per Provider Order: Yes      Mobility  Bed Mobility Overal bed mobility: Needs Assistance Bed Mobility: Supine to Sit     Supine to sit: Supervision          Transfers Overall transfer level: Needs assistance Equipment used: Rolling walker (2 wheels) Transfers: Sit to/from Stand Sit to Stand: Contact guard assist           General transfer comment: CGA for safety.    Ambulation/Gait Ambulation/Gait assistance: Contact guard assist, Min assist Gait Distance (Feet): 50 Feet Assistive device: Rolling walker (2 wheels) Gait Pattern/deviations: Decreased stride length, Step-to pattern Gait velocity: decreased      General Gait Details: Slowed step through pattern. Heavy reliance on UE.  Stairs            Wheelchair Mobility     Tilt Bed    Modified Rankin (Stroke Patients Only)       Balance Overall balance assessment: Needs assistance Sitting-balance support: Bilateral upper extremity supported, Feet supported Sitting balance-Leahy Scale: Good     Standing balance support: Bilateral upper extremity supported Standing balance-Leahy Scale: Poor Standing balance comment: Reliant on RW                             Pertinent Vitals/Pain Pain Assessment Pain Assessment: 0-10 Pain Score: 7  Pain Location: R knee Pain Descriptors / Indicators: Sore, Aching Pain Intervention(s): Monitored during session, Limited activity within patient's tolerance, Premedicated before session    Home Living Family/patient expects to be discharged to:: Private residence Living Arrangements: Spouse/significant other Available Help at Discharge: Family;Available PRN/intermittently Type of Home: House Home Access: Stairs to enter Entrance Stairs-Rails: Can reach both Entrance Stairs-Number of Steps: 5   Home Layout: One level Home Equipment: Grab bars - tub/shower;Shower Counsellor (2 wheels)      Prior Function Prior Level of Function : Independent/Modified Independent;Driving             Mobility Comments: Pt reports using a RW for mobility PTA ADLs Comments: Ind     Extremity/Trunk Assessment   Upper Extremity Assessment Upper Extremity Assessment: Overall WFL for tasks assessed    Lower Extremity Assessment Lower Extremity Assessment: RLE deficits/detail RLE Deficits / Details:  R TKA    Cervical / Trunk Assessment Cervical / Trunk Assessment: Kyphotic  Communication   Communication Communication: No apparent difficulties    Cognition Arousal: Alert Behavior During Therapy: WFL for tasks assessed/performed   PT - Cognitive impairments: No apparent  impairments                         Following commands: Intact       Cueing Cueing Techniques: Verbal cues     General Comments General comments (skin integrity, edema, etc.): VSS    Exercises     Assessment/Plan    PT Assessment Patient needs continued PT services  PT Problem List Decreased strength;Decreased range of motion;Decreased activity tolerance;Decreased balance;Decreased mobility;Decreased coordination;Decreased knowledge of use of DME;Decreased safety awareness;Decreased knowledge of precautions;Cardiopulmonary status limiting activity       PT Treatment Interventions DME instruction;Gait training;Stair training;Functional mobility training;Therapeutic activities;Therapeutic exercise;Balance training;Neuromuscular re-education;Patient/family education    PT Goals (Current goals can be found in the Care Plan section)  Acute Rehab PT Goals Patient Stated Goal: to go home PT Goal Formulation: With patient Time For Goal Achievement: 03/28/24 Potential to Achieve Goals: Good    Frequency 7X/week     Co-evaluation               AM-PAC PT 6 Clicks Mobility  Outcome Measure Help needed turning from your back to your side while in a flat bed without using bedrails?: A Little Help needed moving from lying on your back to sitting on the side of a flat bed without using bedrails?: A Little Help needed moving to and from a bed to a chair (including a wheelchair)?: A Little Help needed standing up from a chair using your arms (e.g., wheelchair or bedside chair)?: A Little Help needed to walk in hospital room?: A Little Help needed climbing 3-5 steps with a railing? : A Lot 6 Click Score: 17    End of Session Equipment Utilized During Treatment: Gait belt Activity Tolerance: Patient tolerated treatment well Patient left: in bed;with call bell/phone within reach;with family/visitor present Nurse Communication: Mobility status PT Visit Diagnosis: Other  abnormalities of gait and mobility (R26.89)    Time: 8585-8562 PT Time Calculation (min) (ACUTE ONLY): 23 min   Charges:   PT Evaluation $PT Eval Moderate Complexity: 1 Mod PT Treatments $Gait Training: 8-22 mins PT General Charges $$ ACUTE PT VISIT: 1 Visit         Sueellen NOVAK, PT, DPT Acute Rehab Services [phone]   Kamilo Och 03/14/2024, 5:07 PM

## 2024-03-14 NOTE — Discharge Instructions (Signed)

## 2024-03-14 NOTE — Progress Notes (Signed)
   Subjective:  Patient reports pain as moderate.  No events.  Today's  total administered Morphine Milligram Equivalents: 117.5  Objective:   VITALS:   Vitals:   03/14/24 1115 03/14/24 1130 03/14/24 1145 03/14/24 1639  BP: 127/85 (!) 133/93 (!) 147/92 (!) 141/91  Pulse: (!) 56 61 (!) 56 75  Resp: 13 13 10    Temp:   98 F (36.7 C) 97.9 F (36.6 C)  TempSrc:      SpO2: 96% 97% 97% 98%  Weight:      Height:        Sensation intact distally Intact pulses distally Dorsiflexion/Plantar flexion intact   Lab Results  Component Value Date   WBC 6.6 03/09/2024   HGB 14.6 03/09/2024   HCT 42.6 03/09/2024   MCV 93.0 03/09/2024   PLT 202 03/09/2024     Assessment/Plan:  * Day of Surgery *   - Expected postop acute blood loss anemia - Up with PT/OT - DVT ppx - SCDs, ambulation, eliquis  POD 1 - WBAT operative extremity - Pain controlled - Discharge planning - anticipate home tomrorow  Justin Burnett 03/14/2024, 8:07 PM

## 2024-03-14 NOTE — Progress Notes (Signed)
 Pt lunch tray ordered.

## 2024-03-14 NOTE — Anesthesia Procedure Notes (Signed)
 Anesthesia Regional Block: Adductor canal block   Pre-Anesthetic Checklist: , timeout performed,  Correct Patient, Correct Site, Correct Laterality,  Correct Procedure, Correct Position, site marked,  Risks and benefits discussed,  Surgical consent,  Pre-op evaluation,  At surgeon's request and post-op pain management  Laterality: Right  Prep: chloraprep       Needles:  Injection technique: Single-shot  Needle Type: Echogenic Needle     Needle Length: 9cm  Needle Gauge: 21     Additional Needles:   Narrative:  Start time: 03/14/2024 6:55 AM End time: 03/14/2024 7:02 AM Injection made incrementally with aspirations every 5 mL.  Performed by: Personally  Anesthesiologist: Maryclare Cornet, MD  Additional Notes: Pt tolerated the procedure well.

## 2024-03-14 NOTE — Progress Notes (Signed)
 Pt arrived to 6 north room 7 alert and oriented x4. Pain level 6/10. Oxy given at 1130. Iceman in place. Ted hose on. Bed in lowest position, call light in reach. All needs met at this time

## 2024-03-14 NOTE — Transfer of Care (Signed)
 Immediate Anesthesia Transfer of Care Note  Patient: Justin Burnett  Procedure(s) Performed: ARTHROPLASTY, KNEE, TOTAL (Right: Knee)  Patient Location: PACU  Anesthesia Type:Spinal and MAC combined with regional for post-op pain  Level of Consciousness: awake, alert , oriented, and drowsy  Airway & Oxygen Therapy: Patient Spontanous Breathing  Post-op Assessment: Report given to RN and Post -op Vital signs reviewed and stable  Post vital signs: Reviewed and stable  Last Vitals:  Vitals Value Taken Time  BP    Temp    Pulse    Resp    SpO2      Last Pain:  Vitals:   03/14/24 0940  TempSrc:   PainSc: 6       Patients Stated Pain Goal: 3 (03/14/24 0630)  Complications: No notable events documented.

## 2024-03-14 NOTE — Anesthesia Postprocedure Evaluation (Signed)
 Anesthesia Post Note  Patient: Justin Burnett  Procedure(s) Performed: ARTHROPLASTY, KNEE, TOTAL (Right: Knee)     Patient location during evaluation: PACU Anesthesia Type: MAC, Regional and Spinal Level of consciousness: oriented and awake and alert Pain management: pain level controlled Vital Signs Assessment: post-procedure vital signs reviewed and stable Respiratory status: spontaneous breathing, respiratory function stable and patient connected to nasal cannula oxygen Cardiovascular status: blood pressure returned to baseline and stable Postop Assessment: no headache, no backache and no apparent nausea or vomiting Anesthetic complications: no   No notable events documented.  Last Vitals:  Vitals:   03/14/24 1130 03/14/24 1145  BP: (!) 133/93 (!) 147/92  Pulse: 61 (!) 56  Resp: 13 10  Temp:  36.7 C  SpO2: 97% 97%    Last Pain:  Vitals:   03/14/24 1326  TempSrc:   PainSc: 5                  Bri Wakeman S

## 2024-03-14 NOTE — TOC Initial Note (Signed)
 Transition of Care (TOC) - Initial/Assessment Note   Spoke to patient at bedside. From home with spouse.   Orders for HHPT, walker and bedside commode. Patient has rolling walker at home already , does need bedside commode. Bedside commode ordered with Jermaine with [employer].   MD office arranged home health PT with [facility] patient in agreement.   Lynette with [facility] confined referral for HHPT has been accepted already  Patient Details  Name: Justin Burnett MRN: [id number] Date of Birth: 06/03/1966  Transition of Care [facility]) CM/SW Contact:    Stephane Powell Jansky, RN Phone Number: 03/14/2024, 4:11 PM  Clinical Narrative:                   Expected Discharge Plan: Home w Home Health Services Barriers to Discharge: Continued Medical Work up   Patient Goals and CMS Choice Patient states their goals for this hospitalization and ongoing recovery are:: to return to home CMS Medicare.gov Compare Post Acute Care list provided to:: Patient Choice offered to / list presented to : Patient      Expected Discharge Plan and Services   Discharge Planning Services: CM Consult Post Acute Care Choice: Home Health, Durable Medical Equipment Living arrangements for the past 2 months: Single Family Home                 DME Arranged: 3-N-1 DME Agency: [employer] Date DME Agency Contacted: 03/14/24 Time DME Agency Contacted: 1610 Representative spoke with at DME Agency: [facility] HH Arranged: PT HH Agency: [facility] Date HH Agency Contacted: 03/14/24 Time HH Agency Contacted: 1610 Representative spoke with at [facility] Agency: Arna  Prior Living Arrangements/Services Living arrangements for the past 2 months: Single Family Home Lives with:: Spouse Patient language and need for interpreter reviewed:: Yes Do you feel safe going back to the place where you live?: Yes      Need for Family Participation in Patient Care: Yes (Comment) Care giver support system in place?: Yes  (comment) Current home services: DME Criminal Activity/Legal Involvement Pertinent to Current Situation/Hospitalization: No - Comment as needed  Activities of Daily Living   ADL Screening (condition at time of admission) Independently performs ADLs?: No Does the patient have a NEW difficulty with bathing/dressing/toileting/self-feeding that is expected to last >3 days?: No Does the patient have a NEW difficulty with getting in/out of bed, walking, or climbing stairs that is expected to last >3 days?: No Does the patient have a NEW difficulty with communication that is expected to last >3 days?: No Is the patient deaf or have difficulty hearing?: No Does the patient have difficulty seeing, even when wearing glasses/contacts?: No Does the patient have difficulty concentrating, remembering, or making decisions?: No  Permission Sought/Granted   Permission granted to share information with : Yes, Verbal Permission Granted     Permission granted to share info w AGENCY: [facility] and [facility]        Emotional Assessment Appearance:: Appears stated age Attitude/Demeanor/Rapport: Engaged Affect (typically observed): Appropriate Orientation: : Oriented to Self, Oriented to Place, Oriented to  Time, Oriented to Situation Alcohol / Substance Use: Not Applicable Psych Involvement: No (comment)  Admission diagnosis:  Primary osteoarthritis of right knee [M17.11] Status post total right knee replacement [Z96.651] Patient Active Problem List   Diagnosis Date Noted   Status post total right knee replacement 03/14/2024   Status post total left knee replacement 10/26/2023   Preop cardiovascular exam 08/11/2023   Obesity (BMI 30.0-34.9) 08/11/2023  Cardiac murmur 08/11/2023   Cigarette smoker 08/11/2023   Prominent abdominal aortic pulsation 08/11/2023   Hypertension    Nerve damage    Neuromuscular disorder (HCC)    Abnormal EKG    Primary osteoarthritis of right knee 06/13/2022    Paresthesia 03/13/2022   Chronic bilateral low back pain without sciatica 03/13/2022   Hyperlipidemia 10/17/2021   Prediabetes 10/17/2021   Essential (primary) hypertension 09/26/2021   Neuropathy    Osteoarthritis 11/03/2011   PCP:  [name], NP Pharmacy:   [facility] [address] [place] [address] Phone: [phone] Fax: [phone]  [facility] [address], [address] [place] [address] Phone: [phone] Fax: [phone]     Social Drivers of Health (SDOH) Social History: SDOH Screenings   Food Insecurity: No Food Insecurity (03/14/2024)  Housing: Unknown (03/14/2024)  Transportation Needs: No Transportation Needs (03/14/2024)  Utilities: Not At Risk (03/14/2024)  Alcohol Screen: Low Risk  (07/23/2023)  Depression (PHQ2-9): Low Risk  (01/20/2024)  Financial Resource Strain: Low Risk  (01/20/2024)  Physical Activity: Sufficiently Active (07/23/2023)  Social Connections: Moderately Isolated (01/20/2024)  Stress: No Stress Concern Present (01/20/2024)  Tobacco Use: High Risk (03/14/2024)  Health Literacy: Adequate Health Literacy (01/20/2024)   SDOH Interventions:     Readmission Risk Interventions     No data to display

## 2024-03-14 NOTE — Op Note (Signed)
 Total Knee Arthroplasty Procedure Note  Preoperative diagnosis: Right knee osteoarthritis  Postoperative diagnosis:same  Operative findings: Complete loss of articular cartilage from all joint surfaces Varus deformity Flexion contracture Severe synovitis Excellent bone quality  Operative procedure: Right total knee arthroplasty. CPT [phone]  Surgeon: N. Ozell Cummins, MD  Assist: Ronal Morna Grave, PA-C; necessary for the timely completion of procedure and due to complexity of procedure.  Anesthesia: Spinal, regional  Tourniquet time: see anesthesia record  Implants used: Zimmer persona press fit Femur: CR 12 Tibia: G Patella: 38 mm Polyethylene: 16 mm medial congruent  Indication: Justin Burnett is a 58 y.o. year old male with a history of knee pain. Having failed conservative management, the patient elected to proceed with a total knee arthroplasty.  We have reviewed the risk and benefits of the surgery and they elected to proceed after voicing understanding.  Procedure:  After informed consent was obtained and understanding of the risk were voiced including but not limited to bleeding, infection, damage to surrounding structures including nerves and vessels, blood clots, leg length inequality and the failure to achieve desired results, the operative extremity was marked with verbal confirmation of the patient in the holding area.   The patient was then brought to the operating room and transported to the operating room table in the supine position.  A tourniquet was applied to the operative extremity around the upper thigh. The operative limb was then prepped and draped in the usual sterile fashion and preoperative antibiotics were administered.  A time out was performed prior to the start of surgery confirming the correct extremity, preoperative antibiotic administration, as well as team members, implants and instruments available for the case. Correct surgical site was also  confirmed with preoperative radiographs. The limb was then elevated for exsanguination and the tourniquet was inflated. A midline incision was made and a standard medial parapatellar approach was performed.  The patella was everted which showed complete loss of articular cartilage.  Synovectomy was performed.  The patella was resected down to 15 mm and sized to a 38 mm.  A cover was placed on the patella for protection from retractors.  We then turned our attention to the femur.  The ACL was sacrificed. Start site was drilled in the femur and the intramedullary distal femoral cutting guide was placed, set at 3 degrees valgus due to the severe varus deformity, taking 12 mm of distal resection for the flexion contracture. The distal cut was made. Osteophytes were then removed.   Next, the proximal tibial cutting guide was placed with appropriate slope, varus/valgus alignment and depth of resection. A drop rod was attached to confirm that it was pointed to the second metatarsal.  The proximal tibial cut was made taking 2 mm off the low side. Gap blocks were then used to assess the extension gap and alignment, and appropriate soft tissue releases were performed. Attention was turned back to the femur, which was sized using the sizing guide to a size 12. Appropriate rotation of the femoral component was determined using epicondylar axis, Whiteside's line, and assessing the flexion gap under ligament tension. The appropriate size 4-in-1 cutting block was placed and cuts were made.  Posterior femoral osteophytes and uncapped bone were then removed with the curved osteotome.  Trial components were placed, and stability was checked in full extension, mid-flexion, and deep flexion.  The PCL was resected.  The patella tracked well without a lateral release. Trial components were then removed and tibial preparation  performed.  The tibial trial was pointed to the medial third of the tibial tubercle.  The tibia was sized for  a size G component and prepped.  Trial components were removed.   The bony surfaces were irrigated with a pulse lavage and then dried. Final components were placed.  The final polyethylene liner, 16 mm thick, was inserted and checked to ensure the locking mechanism had engaged appropriately.  The stability of the construct was re-evaluated throughout a range of motion and found to be acceptable.  The tourniquet was deflated and hemostasis was achieved. The wound was irrigated with normal saline.  One gram of vancomycin  powder was placed in the surgical bed.  Topical mixture of 0.25% bupivacaine  and meloxicam  was placed in the joint for postoperative pain.  Capsular closure was performed with a #1 statafix in flexion, subcutaneous fat closed with a 0 vicryl suture, then subcutaneous tissue closed with interrupted 2.0 vicryl suture. The skin was then closed with a 2.0 nylon and dermabond. A sterile dressing was applied.  The patient was awakened in the operating room and taken to recovery in stable condition. All sponge, needle, and instrument counts were correct at the end of the case.  Morna Grave was necessary for opening, closing, retracting, limb positioning and overall facilitation and completion of the surgery.  Position: supine  Complications: none.  Time Out: performed  Drains/Packing: none Estimated blood loss: minimal Returned to Recovery Room: in good condition.   Mechanical VTE (DVT) Prophylaxis: sequential compression devices, TED thigh-high  Chemical VTE (DVT) Prophylaxis: eliquis   Fluid Replacement  Crystalloid: see anesthesia record Blood: none  FFP: none   Specimens Removed: 1 to pathology  Sponge and Instrument Count Correct? yes  PACU: portable radiograph - knee AP and Lateral  Plan/RTC: Return in 2 weeks for suture removal.  Weight Bearing/Load Lower Extremity: full   Implant Name Type Inv. Item Serial No. Manufacturer Lot No. LRB No. Used Action  COMPONENT FEM STD  PS KN [id number] - [id number] Joint COMPONENT FEM STD PS KN [id number]  ZIMMER RECON(ORTH,TRAU,BIO,SG) [id number] Right 1 Implanted  COMPONENT TIB KNEE PS G 0 RT - [id number] Joint COMPONENT TIB KNEE PS G 0 RT  ZIMMER RECON(ORTH,TRAU,BIO,SG) [id number] Right 1 Implanted  COMPONENT PATELLA 3 PEG 38 - [id number] Joint COMPONENT PATELLA 3 PEG 38  ZIMMER RECON(ORTH,TRAU,BIO,SG) [id number] Right 1 Implanted  LINER TIB PS RM GH/12 16 RT - [id number] Liner LINER TIB PS RM GH/12 16 RT  ZIMMER RECON(ORTH,TRAU,BIO,SG) [id number] Right 1 Implanted    N. Ozell Cummins, MD [facility] 8:56 AM

## 2024-03-14 NOTE — Progress Notes (Signed)
 Orthopedic Tech Progress Note Patient Details:  Justin Burnett Nov 07, 1965 [id number]  Ortho Devices Type of Ortho Device: Bone foam zero knee Ortho Device/Splint Location: RLE Ortho Device/Splint Interventions: Ordered, Application   Post Interventions Patient Tolerated: Well Instructions Provided: Care of device  [name] Pac 03/14/2024, 10:45 AM

## 2024-03-15 ENCOUNTER — Other Ambulatory Visit (HOSPITAL_COMMUNITY): Payer: Self-pay

## 2024-03-15 ENCOUNTER — Emergency Department (HOSPITAL_COMMUNITY)
Admission: EM | Admit: 2024-03-15 | Discharge: 2024-03-16 | Disposition: A | Discharge status: Home / Self Care | Attending: Emergency Medicine | Admitting: Emergency Medicine

## 2024-03-15 ENCOUNTER — Emergency Department (HOSPITAL_COMMUNITY)

## 2024-03-15 ENCOUNTER — Other Ambulatory Visit: Payer: Self-pay

## 2024-03-15 ENCOUNTER — Encounter (HOSPITAL_COMMUNITY): Payer: Self-pay | Admitting: Orthopaedic Surgery

## 2024-03-15 DIAGNOSIS — Z7901 Long term (current) use of anticoagulants: Secondary | ICD-10-CM | POA: Diagnosis not present

## 2024-03-15 DIAGNOSIS — I10 Essential (primary) hypertension: Secondary | ICD-10-CM | POA: Insufficient documentation

## 2024-03-15 DIAGNOSIS — Z79899 Other long term (current) drug therapy: Secondary | ICD-10-CM | POA: Diagnosis not present

## 2024-03-15 DIAGNOSIS — W19XXXA Unspecified fall, initial encounter: Secondary | ICD-10-CM | POA: Insufficient documentation

## 2024-03-15 DIAGNOSIS — Z96651 Presence of right artificial knee joint: Secondary | ICD-10-CM | POA: Insufficient documentation

## 2024-03-15 DIAGNOSIS — L7682 Other postprocedural complications of skin and subcutaneous tissue: Secondary | ICD-10-CM | POA: Diagnosis present

## 2024-03-15 DIAGNOSIS — M1711 Unilateral primary osteoarthritis, right knee: Secondary | ICD-10-CM | POA: Diagnosis not present

## 2024-03-15 NOTE — Progress Notes (Signed)
 Subjective: 1 Day Post-Op Procedure(s) (LRB): ARTHROPLASTY, KNEE, TOTAL (Right) Patient reports pain as mild.    Objective: Vital signs in last 24 hours: Temp:  [97.8 F (36.6 C)-98.5 F (36.9 C)] 98.3 F (36.8 C) (09/30 0429) Pulse Rate:  [53-75] 69 (09/30 0429) Resp:  [9-20] 17 (09/30 0429) BP: (123-147)/(77-101) 142/84 (09/30 0429) SpO2:  [92 %-99 %] 97 % (09/30 0429)  Intake/Output from previous day: 09/29 0701 - 09/30 0700 In: 1688.8 [I.V.:1288.6; IV Piggyback:400.2] Out: 650 [Urine:600; Blood:50] Intake/Output this shift: No intake/output data recorded.  No results for input(s): HGB in the last 72 hours. No results for input(s): WBC, RBC, HCT, PLT in the last 72 hours. No results for input(s): NA, K, CL, CO2, BUN, CREATININE, GLUCOSE, CALCIUM  in the last 72 hours. No results for input(s): LABPT, INR in the last 72 hours.  Neurologically intact Neurovascular intact Sensation intact distally Intact pulses distally Dorsiflexion/Plantar flexion intact Incision: scant drainage No cellulitis present Compartment soft   Assessment/Plan: 1 Day Post-Op Procedure(s) (LRB): ARTHROPLASTY, KNEE, TOTAL (Right) Advance diet Up with therapy D/C IV fluids Discharge home with home health once cleared by PT WBAT RLE Post-op meds with the exception of eliquis  sent to pharmacy prior to sx.  Eliquis  sent to toc yesterday      Ronal LITTIE Grave 03/15/2024, 8:26 AM

## 2024-03-15 NOTE — ED Provider Notes (Signed)
 [place] EMERGENCY DEPARTMENT AT [facility] Provider Note   CSN: [id number] Arrival date & time: 03/15/24  2146     Patient presents with: Knee Pain   Justin Burnett is a 58 y.o. male.   Patient with history of hypertension, hyperlipidemia resents today with complaints of fall.  He reports that he had a knee replacement yesterday, was just discharged home this morning. Was told to walk with a walker, however reports that he took a few steps in his home without the walker and fell.  He reports that he fell mostly on his right hip.  He did not hit his head or lose consciousness.  He is not anticoagulated.  Reports that when he tried to stand up he felt his surgical sutures on his knee pop and noticed bleeding through the dressing.  Denies any significant knee pain.  He then called EMS for transport.  He denies any other injuries or complaints.  Does report that he had a cup of bourbon this evening.  The history is provided by the patient. No language interpreter was used.  Knee Pain      Prior to Admission medications   Medication Sig Start Date End Date Taking? Authorizing Provider  apixaban  (ELIQUIS ) 2.5 MG TABS tablet Take 1 tablet (2.5 mg total) by mouth 2 (two) times daily for 30 days after surgery to prevent blood clots 03/14/24   Jule Ronal CROME, PA-C  amLODipine  (NORVASC ) 10 MG tablet Take 1 tablet (10 mg total) by mouth daily. 01/15/24   [name], NP  Ascorbic Acid (VITAMIN C) 1000 MG tablet Take 1,000 mg by mouth daily.    [provider]  atorvastatin  (LIPITOR) 40 MG tablet Take 1 tablet (40 mg total) by mouth daily. 01/15/24   [name], NP  Cholecalciferol 25 MCG (1000 UT) capsule Take 1,000 Units by mouth daily.    [provider]  docusate sodium  (COLACE) 100 MG capsule Take 1 capsule (100 mg total) by mouth daily as needed. Patient not taking: Reported on 03/08/2024 03/02/24 03/02/25  Jule Ronal CROME, PA-C  gabapentin  (NEURONTIN ) 800  MG tablet Take 1 tablet (800 mg total) by mouth 2 (two) times daily. 01/20/24   [name], NP  mesalamine  (LIALDA ) 1.2 g EC tablet Take 4 tablets (4.8 g total) by mouth daily with breakfast. Patient not taking: Reported on 03/08/2024 02/03/24   Abran Norleen SAILOR, MD  methocarbamol  (ROBAXIN ) 750 MG tablet Take 1 tablet (750 mg total) by mouth 3 (three) times daily as needed for muscle spasms. Patient not taking: Reported on 03/08/2024 03/02/24   Jule Ronal CROME, PA-C  mupirocin  ointment (BACTROBAN ) 2 % Apply 1 Application topically 2 (two) times daily. Patient not taking: No sig reported 01/20/24   [name], NP  ondansetron  (ZOFRAN ) 4 MG tablet Take 1 tablet (4 mg total) by mouth every 8 (eight) hours as needed for nausea or vomiting. 03/02/24   Jule Ronal CROME, PA-C  oxyCODONE -acetaminophen  (PERCOCET) 5-325 MG tablet Take 1-2 tablets by mouth every 8 (eight) hours as needed. (to be taken after surgery) 03/02/24   Jule Ronal CROME, PA-C  silver  sulfADIAZINE  (SILVADENE ) 1 % cream Apply 1 Application topically 2 (two) times daily. Patient not taking: Reported on 03/08/2024 03/25/23   [name], NP    Allergies: Meperidine and Meperidine hcl    Review of Systems  Musculoskeletal:  Positive for arthralgias.  All other systems reviewed and are negative.   Updated  Vital Signs BP (!) 118/58   Pulse 77   Temp 98.1 F (36.7 C)   Resp 16   Ht 6' (1.829 m)   Wt 104 kg   SpO2 100%   BMI 31.10 kg/m   Physical Exam Vitals and nursing note reviewed.  Constitutional:      General: He is not in acute distress.    Appearance: Normal appearance. He is normal weight. He is not ill-appearing, toxic-appearing or diaphoretic.  HENT:     Head: Normocephalic and atraumatic.  Cardiovascular:     Rate and Rhythm: Normal rate.  Pulmonary:     Effort: Pulmonary effort is normal. No respiratory distress.  Musculoskeletal:        General: Normal range of motion.     Cervical back: Normal range of  motion.     Comments: Right knee with post surgical midline scar with a few broken sutures.  The wound does have an area that is slowly oozing blood, the wound borders are touching, do not obviously dehisce with manipulation.  No obvious deformity.  DP and PT pulses intact and 2+.  See images below for further.  Skin:    General: Skin is warm and dry.  Neurological:     General: No focal deficit present.     Mental Status: He is alert.  Psychiatric:        Mood and Affect: Mood normal.        Behavior: Behavior normal.     (all labs ordered are listed, but only abnormal results are displayed) Labs Reviewed - No data to display  EKG: None  Radiology: DG Knee Right Port Result Date: 03/14/2024 CLINICAL DATA:  Right knee pain.  Status post arthroplasty. EXAM: PORTABLE RIGHT KNEE - 1-2 VIEW COMPARISON:  Right knee radiograph dated 07/07/2023. FINDINGS: Total right knee arthroplasty. The arthroplasty components appear intact and in anatomic alignment. There is no acute fracture or dislocation. Postsurgical changes including air and fluid within the joint space. IMPRESSION: Total right knee arthroplasty. No acute fracture or dislocation. Electronically Signed   By: Vanetta Chou M.D.   On: 03/14/2024 14:09     Procedures   Medications Ordered in the ED - No data to display                                  Medical Decision Making Amount and/or Complexity of Data Reviewed Radiology: ordered.   This patient is a 58 y.o. male  who presents to the ED for concern of fall, knee injury, post op problem.   Differential diagnoses prior to evaluation: The emergent differential diagnosis includes, but is not limited to,  periprosthetic fracture, wound dehiscence . This is not an exhaustive differential.   Past Medical History / Co-morbidities / Social History:  has a past medical history of Abnormal EKG, Chronic bilateral low back pain without sciatica (03/13/2022), Essential (primary)  hypertension (09/26/2021), Hyperlipidemia (10/17/2021), Hypertension, Inflammatory bowel disease, Nerve damage, Neuropathy, Osteoarthritis (11/03/2011), Osteoarthritis of knees, bilateral (06/13/2022), Paresthesia (03/13/2022), and Prediabetes (10/17/2021).  Additional history: Chart reviewed. Pertinent results include: had surgery with Dr. Jerri yesterday, was discharged earlier this morning  Physical Exam: Physical exam performed. The pertinent findings include: Right knee with post surgical midline scar with a few broken sutures.  The wound does have an area that is slowly oozing blood, the wound borders are touching, do not obviously dehisce with manipulation.  No obvious  deformity.  DP and PT pulses intact and 2+.  See images above for further.  Imaging studies: I ordered an x-ray of the patients right knee which has resulted and reveals   1. Intact right knee replacement. 2. Moderate severity anterior soft tissue swelling with a large joint effusion.  I have personally reviewed and interpreted this imaging and agree with radiology interpretation.   Disposition: After consideration of the diagnostic results and the patients response to treatment, I feel that emergency department workup does not suggest an emergent condition requiring admission or immediate intervention beyond what has been performed at this time. The plan is: discharge with dressing for his wound and instructions to call his surgeon tomorrow for follow-up. Evaluation and diagnostic testing in the emergency department does not suggest an emergent condition requiring admission or immediate intervention beyond what has been performed at this time.  Plan for discharge with close PCP follow-up.  Patient is understanding and amenable with plan, educated on red flag symptoms that would prompt immediate return.  Patient discharged in stable condition.  Findings and plan of care discussed with supervising physician Dr. Haze who is in  agreement.   Final diagnoses:  Fall, initial encounter  Other postoperative complication of skin    ED Discharge Orders     None     An After Visit Summary was printed and given to the patient.      Nora Lauraine DELENA DEVONNA 03/15/24 2345    Haze Lonni PARAS, MD 03/17/24 [phone]

## 2024-03-15 NOTE — Progress Notes (Signed)
 NM rounded on patient saw IV unhooked and the line on the floor. Patient stated he was released by PT and was ready to go. NM educated patient not to touch the IV or the pump. NM explained to patient the DR has not put a discharge order in for his nurse to be able to discharge patient.

## 2024-03-15 NOTE — Plan of Care (Signed)

## 2024-03-15 NOTE — Progress Notes (Signed)
 Patient ID: Justin Burnett, male   DOB: [birth year], 58 y.o.   MRN: [id number]  Received call at the office from the patient.  Once he has cleared PT, he may be discharged home.  He will not need hhpt, but has been set up for oppt

## 2024-03-15 NOTE — Discharge Summary (Signed)
 Patient ID: Justin Burnett MRN: [id number] DOB/AGE: 15-Sep-1965 58 y.o.  Admit date: 03/14/2024 Discharge date: 03/15/2024  Admission Diagnoses:  Principal Problem:   Primary osteoarthritis of right knee Active Problems:   Status post total right knee replacement   Discharge Diagnoses:  Same  Past Medical History:  Diagnosis Date   Abnormal EKG    Chronic bilateral low back pain without sciatica 03/13/2022   Essential (primary) hypertension 09/26/2021   Hyperlipidemia 10/17/2021   Hypertension    Inflammatory bowel disease    by 12/28/2023 colonoscopy   Nerve damage    Neuropathy    Osteoarthritis 11/03/2011   Osteoarthritis of knees, bilateral 06/13/2022   Paresthesia 03/13/2022   Prediabetes 10/17/2021    Surgeries: Procedure(s): ARTHROPLASTY, KNEE, TOTAL on 03/14/2024   Consultants:   Discharged Condition: Improved  Hospital Course: ROMY MCGUE is an 58 y.o. male who was admitted 03/14/2024 for operative treatment ofPrimary osteoarthritis of right knee. Patient has severe unremitting pain that affects sleep, daily activities, and work/hobbies. After pre-op clearance the patient was taken to the operating room on 03/14/2024 and underwent  Procedure(s): ARTHROPLASTY, KNEE, TOTAL.    Patient was given perioperative antibiotics:  Anti-infectives (From admission, onward)    Start     Dose/Rate Route Frequency Ordered Stop   03/14/24 1315  ceFAZolin  (ANCEF ) IVPB 2g/100 mL premix        2 g 200 mL/hr over 30 Minutes Intravenous Every 6 hours 03/14/24 1224 03/14/24 2038   03/14/24 0806  vancomycin  (VANCOCIN ) powder  Status:  Discontinued          As needed 03/14/24 0806 03/14/24 0931   03/14/24 0615  ceFAZolin  (ANCEF ) IVPB 2g/100 mL premix        2 g 200 mL/hr over 30 Minutes Intravenous On call to O.R. 03/14/24 [id number] 03/14/24 0748        Patient was given sequential compression devices, early ambulation, and chemoprophylaxis to prevent DVT.  Inpatient Morphine  Milligram Equivalents Per Day 9/29 - 9/30   Values displayed are in units of MME/Day    Order Start / End Date Yesterday Today    oxyCODONE  (Oxy IR/ROXICODONE ) immediate release tablet 5 mg 9/29 - 9/29 0 of Unknown --    oxyCODONE  (ROXICODONE ) 5 MG/5ML solution 5 mg 9/29 - 9/29 0 of Unknown --      Group total: 0 of Unknown     fentaNYL  (SUBLIMAZE ) injection 25-50 mcg 9/29 - 9/29 45 of 45-90 --    fentaNYL  (SUBLIMAZE ) 100 MCG/2ML injection 9/29 - 9/29 0 of Unknown --    oxyCODONE  (Oxy IR/ROXICODONE ) immediate release tablet 5 mg 9/29 - No end date 7.5 of 15 0 of 30    oxyCODONE  (Oxy IR/ROXICODONE ) immediate release tablet 10 mg 9/29 - No end date 15 of 45 0 of 60    HYDROmorphone  (DILAUDID ) injection 1 mg 9/29 - No end date 20 of 40 0 of 60    fentaNYL  (SUBLIMAZE ) injection 9/29 - 9/29 *30 of 30 --    Daily Totals  * 117.5 of Unknown (at least 175-220) 0 of 150  *One-Step medication  Calculation Errors     Order Type Date Details   oxyCODONE  (Oxy IR/ROXICODONE ) immediate release tablet 5 mg Ordered Dose -- Insufficient frequency information   oxyCODONE  (ROXICODONE ) 5 MG/5ML solution 5 mg Ordered Dose -- Insufficient frequency information   fentaNYL  (SUBLIMAZE ) 100 MCG/2ML injection Ordered Dose -- Frequency type could not be determined  Patient benefited maximally from hospital stay and there were no complications.    Recent vital signs: Patient Vitals for the past 24 hrs:  BP Temp Temp src Pulse Resp SpO2  03/15/24 0429 (!) 142/84 98.3 F (36.8 C) Oral 69 17 97 %  03/15/24 0045 136/84 98.3 F (36.8 C) Oral 71 20 93 %  03/14/24 2147 (!) 139/90 98.5 F (36.9 C) Oral 62 20 97 %  03/14/24 1639 (!) 141/91 97.9 F (36.6 C) -- 75 -- 98 %  03/14/24 1145 (!) 147/92 98 F (36.7 C) -- (!) 56 10 97 %  03/14/24 1130 (!) 133/93 -- -- 61 13 97 %  03/14/24 1115 127/85 -- -- (!) 56 13 96 %  03/14/24 1100 (!) 145/94 98 F (36.7 C) -- (!) 58 (!) 9 96 %  03/14/24 1045 (!) 140/101  -- -- (!) 53 (!) 9 93 %  03/14/24 1030 131/87 -- -- (!) 59 17 96 %  03/14/24 1015 123/77 -- -- 61 14 99 %  03/14/24 1000 124/80 -- -- 60 (!) 9 92 %  03/14/24 0945 124/84 -- -- 64 11 92 %  03/14/24 0933 129/79 97.8 F (36.6 C) -- 63 10 97 %     Recent laboratory studies: No results for input(s): WBC, HGB, HCT, PLT, NA, K, CL, CO2, BUN, CREATININE, GLUCOSE, INR, CALCIUM  in the last 72 hours.  Invalid input(s): PT, 2   Discharge Medications:   Allergies as of 03/15/2024       Reactions   Meperidine Other (See Comments)   Causes seizure-like activities Demerol   Meperidine Hcl Other (See Comments)        Medication List     STOP taking these medications    HYDROcodone -acetaminophen  5-325 MG tablet Commonly known as: NORCO/VICODIN   ibuprofen  800 MG tablet Commonly known as: ADVIL    meloxicam  7.5 MG tablet Commonly known as: Mobic    traMADol  50 MG tablet Commonly known as: ULTRAM        TAKE these medications    amLODipine  10 MG tablet Commonly known as: NORVASC  Take 1 tablet (10 mg total) by mouth daily.   apixaban  2.5 MG Tabs tablet Commonly known as: Eliquis  Take 1 tablet (2.5 mg total) by mouth 2 (two) times daily for 30 days after surgery to prevent blood clots   atorvastatin  40 MG tablet Commonly known as: LIPITOR Take 1 tablet (40 mg total) by mouth daily.   Cholecalciferol 25 MCG (1000 UT) capsule Take 1,000 Units by mouth daily.   docusate sodium  100 MG capsule Commonly known as: Colace Take 1 capsule (100 mg total) by mouth daily as needed.   gabapentin  800 MG tablet Commonly known as: NEURONTIN  Take 1 tablet (800 mg total) by mouth 2 (two) times daily.   mesalamine  1.2 g EC tablet Commonly known as: LIALDA  Take 4 tablets (4.8 g total) by mouth daily with breakfast.   methocarbamol  750 MG tablet Commonly known as: ROBAXIN  Take 1 tablet (750 mg total) by mouth 3 (three) times daily as needed for muscle  spasms.   mupirocin  ointment 2 % Commonly known as: BACTROBAN  Apply 1 Application topically 2 (two) times daily.   ondansetron  4 MG tablet Commonly known as: Zofran  Take 1 tablet (4 mg total) by mouth every 8 (eight) hours as needed for nausea or vomiting. What changed: Another medication with the same name was removed. Continue taking this medication, and follow the directions you see here.   oxyCODONE -acetaminophen  5-325 MG tablet Commonly known  as: Percocet Take 1-2 tablets by mouth every 8 (eight) hours as needed. (to be taken after surgery)   SSD 1 % cream Generic drug: silver  sulfADIAZINE  Apply 1 Application topically 2 (two) times daily.   vitamin C 1000 MG tablet Take 1,000 mg by mouth daily.               Durable Medical Equipment  (From admission, onward)           Start     Ordered   03/14/24 1225  DME Walker rolling  Once       Question Answer Comment  Walker: With 5 Inch Wheels   Patient needs a walker to treat with the following condition Status post left partial knee replacement      03/14/24 1224   03/14/24 1225  DME 3 n 1  Once        03/14/24 1224   03/14/24 1225  DME Bedside commode  Once       Question:  Patient needs a bedside commode to treat with the following condition  Answer:  Status post left partial knee replacement   03/14/24 1224            Diagnostic Studies: DG Knee Right Port Result Date: 03/14/2024 CLINICAL DATA:  Right knee pain.  Status post arthroplasty. EXAM: PORTABLE RIGHT KNEE - 1-2 VIEW COMPARISON:  Right knee radiograph dated 07/07/2023. FINDINGS: Total right knee arthroplasty. The arthroplasty components appear intact and in anatomic alignment. There is no acute fracture or dislocation. Postsurgical changes including air and fluid within the joint space. IMPRESSION: Total right knee arthroplasty. No acute fracture or dislocation. Electronically Signed   By: Vanetta Chou M.D.   On: 03/14/2024 14:09     Disposition:      Follow-up Information     Jule Ronal CROME, PA-C. Schedule an appointment as soon as possible for a visit in 2 week(s).   Specialty: Orthopedic Surgery Contact information: [address] [place]  [place] [address] [phone]         [place], [facility] Follow up.   Specialty: Home Health Services Contact information: [address] [place] [address] [phone]                  Signed: Ronal CROME Jule 03/15/2024, 8:27 AM

## 2024-03-15 NOTE — Progress Notes (Signed)
 Discharge instructions given to pt. Pt verbalized understanding of all teaching and had no further questions. Pt instructed to pick up discharge meds from pharmacy on second floor when leaving pt and pt wife both verbalized understanding and location of pharmacy. Pt discharged to home via wheelchair with wife, with all belongings, paperwork, iceman machine.

## 2024-03-15 NOTE — Evaluation (Signed)
 Occupational Therapy Evaluation Patient Details Name: Justin Burnett MRN: [id number] DOB: 1966-02-18 Today's Date: 03/15/2024   History of Present Illness   58 yo M adm 9/29 for Rt TKA. PMhx: OA, neuropathy, HLD, HTN, neuromuscular disorder, Lt TKA     Clinical Impressions PTA, pt lived with spouse and reports being independent in BADL. Upon eval, pt needing up to CGA for BADL and functional transfers. Pt taking ~2 steps without RW this session in restroom with minor self corrected LOB. Suspect pt will progress well. Will follow one more session acutely, but do not suspect need for follow up OT after discharge      If plan is discharge home, recommend the following:   A little help with walking and/or transfers;A little help with bathing/dressing/bathroom;Assistance with cooking/housework;Assist for transportation;Help with stairs or ramp for entrance     Functional Status Assessment   Patient has had a recent decline in their functional status and demonstrates the ability to make significant improvements in function in a reasonable and predictable amount of time.     Equipment Recommendations   None recommended by OT     Recommendations for Other Services         Precautions/Restrictions   Precautions Precautions: Fall;Knee Precaution Booklet Issued: Yes (comment) Recall of Precautions/Restrictions: Intact Restrictions Weight Bearing Restrictions Per Provider Order: Yes RLE Weight Bearing Per Provider Order: Weight bearing as tolerated     Mobility Bed Mobility               General bed mobility comments: EOB on arrival and departure    Transfers Overall transfer level: Needs assistance Equipment used: Rolling walker (2 wheels) Transfers: Sit to/from Stand Sit to Stand: Contact guard assist           General transfer comment: CGA for safety.      Balance Overall balance assessment: Needs assistance Sitting-balance support: Bilateral upper  extremity supported, Feet supported Sitting balance-Leahy Scale: Good     Standing balance support: Bilateral upper extremity supported Standing balance-Leahy Scale: Poor Standing balance comment: Reliant on RW                           ADL either performed or assessed with clinical judgement   ADL Overall ADL's : Needs assistance/impaired Eating/Feeding: Sitting;Independent   Grooming: Supervision/safety;Contact guard assist;Standing   Upper Body Bathing: Set up;Sitting   Lower Body Bathing: Contact guard assist;Sit to/from stand   Upper Body Dressing : Set up;Sitting   Lower Body Dressing: Contact guard assist;Sit to/from stand   Toilet Transfer: Contact guard assist;Ambulation;Rolling walker (2 wheels)   Toileting- Clothing Manipulation and Hygiene: Contact guard assist;Sit to/from stand   Tub/ Shower Transfer: Walk-in shower;Contact guard assist;Ambulation;Rolling walker (2 wheels)   Functional mobility during ADLs: Contact guard assist;Rolling walker (2 wheels) General ADL Comments: approcahing supervision     Vision Baseline Vision/History: 1 Wears glasses Ability to See in Adequate Light: 0 Adequate Patient Visual Report: No change from baseline Vision Assessment?: No apparent visual deficits     Perception Perception: Within Functional Limits       Praxis Praxis: WFL       Pertinent Vitals/Pain Pain Assessment Pain Assessment: Faces Faces Pain Scale: Hurts little more Pain Location: R knee Pain Descriptors / Indicators: Sore, Aching Pain Intervention(s): Limited activity within patient's tolerance, Monitored during session     Extremity/Trunk Assessment Upper Extremity Assessment Upper Extremity Assessment: Overall WFL for tasks assessed  Lower Extremity Assessment Lower Extremity Assessment: Defer to PT evaluation   Cervical / Trunk Assessment Cervical / Trunk Assessment: Kyphotic   Communication Communication Communication: No  apparent difficulties   Cognition Arousal: Alert Behavior During Therapy: WFL for tasks assessed/performed Cognition: No apparent impairments                               Following commands: Intact       Cueing  General Comments   Cueing Techniques: Verbal cues  VSS on RA   Exercises     Shoulder Instructions      Home Living Family/patient expects to be discharged to:: Private residence Living Arrangements: Spouse/significant other Available Help at Discharge: Family;Available PRN/intermittently Type of Home: House Home Access: Stairs to enter [employer] of Steps: 5 Entrance Stairs-Rails: Can reach both Home Layout: One level     Bathroom Shower/Tub: Producer, television/film/video: Standard     Home Equipment: Grab bars - tub/shower;Shower Counsellor (2 wheels)          Prior Functioning/Environment Prior Level of Function : Independent/Modified Independent;Driving             Mobility Comments: Pt reports using a RW for mobility PTA ADLs Comments: Ind    OT Problem List: Decreased range of motion;Decreased strength;Impaired balance (sitting and/or standing);Decreased knowledge of use of DME or AE;Decreased knowledge of precautions;Pain   OT Treatment/Interventions: Self-care/ADL training;Therapeutic exercise;DME and/or AE instruction;Therapeutic activities;Patient/family education;Balance training      OT Goals(Current goals can be found in the care plan section)   Acute Rehab OT Goals Patient Stated Goal: get better OT Goal Formulation: With patient Time For Goal Achievement: 03/29/24 Potential to Achieve Goals: Good   OT Frequency:  Min 2X/week    Co-evaluation              AM-PAC OT 6 Clicks Daily Activity     Outcome Measure Help from another person eating meals?: None Help from another person taking care of personal grooming?: A Little Help from another person toileting, which includes  using toliet, bedpan, or urinal?: A Little Help from another person bathing (including washing, rinsing, drying)?: A Little Help from another person to put on and taking off regular upper body clothing?: A Little Help from another person to put on and taking off regular lower body clothing?: A Little 6 Click Score: 19   End of Session Equipment Utilized During Treatment: Gait belt;Rolling walker (2 wheels) Nurse Communication: Mobility status  Activity Tolerance: Patient tolerated treatment well Patient left: Other (comment);with family/visitor present;with call bell/phone within reach (EOB)  OT Visit Diagnosis: Unsteadiness on feet (R26.81);Muscle weakness (generalized) (M62.81);Pain Pain - Right/Left: Right Pain - part of body: Knee                Time: 0802-0822 OT Time Calculation (min): 20 min Charges:  OT General Charges $OT Visit: 1 Visit OT Evaluation $OT Eval Low Complexity: 1 Low  Elma JONETTA Lebron FREDERICK, OTR/L [facility] Acute Rehabilitation Office: [phone]   Elma JONETTA Lebron 03/15/2024, 9:34 AM

## 2024-03-15 NOTE — ED Triage Notes (Addendum)
 Right Knee replacement today Fell tonight on that knee and popped a few stitches. Bleeding controlled. No head strike. Takes eliquis .   +etOH.

## 2024-03-15 NOTE — Progress Notes (Signed)
 Called to room because pt IV pump was beeping. IV not attached to pt and leaking on floor. Pt stated that he unhooked himself because he needed to put his shirt on. Educated pt on infection prevention and not to unhook his own IV or touch IV pump. Pt unhooked himself twice. Lucienne BAYS made aware also and when she rounded on pt he had unhooked his IV again at that time.

## 2024-03-15 NOTE — Progress Notes (Signed)
 Physical Therapy Treatment Patient Details Name: Justin Burnett MRN: [id number] DOB: [name] 03, 1966 Today's Date: 03/15/2024   History of Present Illness 58 yo M adm 9/29 for Rt TKA. PMhx: OA, neuropathy, HLD, HTN, neuromuscular disorder, Lt TKA    PT Comments  Pt received in bed and independent with bed mobility. Wife present for session. Pt mobilizing safely with RW, ambulated 100' and practiced stairs with 1 rail. Pt tolerating there ex well. Home activity education given as well as use of ice and zero knee foam. Pt ready for d/c from a PT standpoint.     If plan is discharge home, recommend the following: A little help with walking and/or transfers;A little help with bathing/dressing/bathroom;Help with stairs or ramp for entrance;Assist for transportation;Assistance with cooking/housework   Can travel by private vehicle        Equipment Recommendations  BSC/3in1    Recommendations for Other Services       Precautions / Restrictions Precautions Precautions: Fall;Knee Precaution Booklet Issued: No Recall of Precautions/Restrictions: Intact Restrictions Weight Bearing Restrictions Per Provider Order: Yes RLE Weight Bearing Per Provider Order: Weight bearing as tolerated     Mobility  Bed Mobility Overal bed mobility: Modified Independent Bed Mobility: Supine to Sit     Supine to sit: Modified independent (Device/Increase time)     General bed mobility comments: pt able to come to EOB without assist    Transfers Overall transfer level: Needs assistance Equipment used: Rolling walker (2 wheels) Transfers: Sit to/from Stand Sit to Stand: Modified independent (Device/Increase time)           General transfer comment: pt stands safely to RW, vc's for controlled motion and not being impulsive    Ambulation/Gait Ambulation/Gait assistance: Supervision Gait Distance (Feet): 100 Feet Assistive device: Rolling walker (2 wheels) Gait Pattern/deviations: Decreased stride  length, Step-to pattern Gait velocity: decreased Gait velocity interpretation: >2.62 ft/sec, indicative of community ambulatory   General Gait Details: mild trunk flexion, more due to back issues than knee. Pt has a tendency to push RW ahead of him, cues to remain close.   Stairs Stairs: Yes Stairs assistance: Contact guard assist Stair Management: One rail Right, Step to pattern, Forwards Number of Stairs: 4 General stair comments: number limited by IV line. Pt safe with stairs and already knew sequencing from past TKA   Wheelchair Mobility     Tilt Bed    Modified Rankin (Stroke Patients Only)       Balance Overall balance assessment: Needs assistance Sitting-balance support: Bilateral upper extremity supported, Feet supported Sitting balance-Leahy Scale: Good     Standing balance support: Bilateral upper extremity supported Standing balance-Leahy Scale: Fair Standing balance comment: Reliant on RW                            Communication Communication Communication: No apparent difficulties  Cognition Arousal: Alert Behavior During Therapy: WFL for tasks assessed/performed   PT - Cognitive impairments: No apparent impairments                         Following commands: Intact      Cueing Cueing Techniques: Verbal cues  Exercises Total Joint Exercises Ankle Circles/Pumps: AROM, Both, 10 reps, Seated Quad Sets: AROM, Both, 10 reps, Seated Heel Slides: AROM, Right, 10 reps, Seated Hip ABduction/ADduction: AROM, Right, 10 reps, Seated Straight Leg Raises: AAROM, Right, 10 reps, Seated (with 10 deg ext lag) Long  Arc Quad: AROM, Right, 10 reps, Seated Goniometric ROM: 10-60    General Comments General comments (skin integrity, edema, etc.): VSS on RA. Reviewed use of zero knee foam and home activity level. Wife present and pt and wife verbalize understanding. Reviewed car transfer as well      Pertinent Vitals/Pain Pain  Assessment Pain Assessment: Faces Faces Pain Scale: Hurts little more Pain Location: R knee Pain Descriptors / Indicators: Sore, Aching Pain Intervention(s): Limited activity within patient's tolerance, Monitored during session, Patient requesting pain meds-RN notified    Home Living Family/patient expects to be discharged to:: Private residence Living Arrangements: Spouse/significant other Available Help at Discharge: Family;Available PRN/intermittently Type of Home: House Home Access: Stairs to enter Entrance Stairs-Rails: Can reach both Entrance Stairs-Number of Steps: 5   Home Layout: One level Home Equipment: Grab bars - tub/shower;Shower seat;Rolling Environmental consultant (2 wheels)      Prior Function            PT Goals (current goals can now be found in the care plan section) Acute Rehab PT Goals Patient Stated Goal: to go home PT Goal Formulation: With patient Time For Goal Achievement: 03/28/24 Potential to Achieve Goals: Good Progress towards PT goals: Progressing toward goals    Frequency    7X/week      PT Plan      Co-evaluation              AM-PAC PT 6 Clicks Mobility   Outcome Measure  Help needed turning from your back to your side while in a flat bed without using bedrails?: None Help needed moving from lying on your back to sitting on the side of a flat bed without using bedrails?: None Help needed moving to and from a bed to a chair (including a wheelchair)?: None Help needed standing up from a chair using your arms (e.g., wheelchair or bedside chair)?: A Little Help needed to walk in hospital room?: A Little Help needed climbing 3-5 steps with a railing? : A Little 6 Click Score: 21    End of Session Equipment Utilized During Treatment: Gait belt Activity Tolerance: Patient tolerated treatment well Patient left: with call bell/phone within reach;with family/visitor present;in chair Nurse Communication: Mobility status PT Visit Diagnosis:  Other abnormalities of gait and mobility (R26.89)     Time: 9099-9059 PT Time Calculation (min) (ACUTE ONLY): 40 min  Charges:    $Gait Training: 8-22 mins $Therapeutic Exercise: 8-22 mins $Therapeutic Activity: 8-22 mins PT General Charges $$ ACUTE PT VISIT: 1 Visit                     Richerd Lipoma, PT  Acute Rehab Services Secure chat preferred Office [phone]    Richerd CROME Karole Oo 03/15/2024, 9:55 AM

## 2024-03-15 NOTE — Discharge Instructions (Addendum)
 As we discussed, your x-ray did not show any emergent concerns.  We have cleaned and dressed your wound and recommend that you call your surgeon first thing in the morning. Unfortunately, we are unable to re-suture your wound, it will have to heal on its own.   Return if development of any new or worsening symptoms

## 2024-03-16 ENCOUNTER — Ambulatory Visit (INDEPENDENT_AMBULATORY_CARE_PROVIDER_SITE_OTHER): Admitting: Physician Assistant

## 2024-03-16 ENCOUNTER — Telehealth: Payer: Self-pay | Admitting: Physician Assistant

## 2024-03-16 ENCOUNTER — Other Ambulatory Visit: Payer: Self-pay | Admitting: Physician Assistant

## 2024-03-16 ENCOUNTER — Telehealth: Payer: Self-pay

## 2024-03-16 ENCOUNTER — Other Ambulatory Visit (HOSPITAL_COMMUNITY): Payer: Self-pay

## 2024-03-16 DIAGNOSIS — T8130XA Disruption of wound, unspecified, initial encounter: Secondary | ICD-10-CM

## 2024-03-16 DIAGNOSIS — Z96651 Presence of right artificial knee joint: Secondary | ICD-10-CM

## 2024-03-16 MED ORDER — DOXYCYCLINE HYCLATE 100 MG PO TABS
100.0000 mg | ORAL_TABLET | Freq: Two times a day (BID) | ORAL | 0 refills | Status: DC
  Filled 2024-03-16: qty 56, 28d supply, fill #0

## 2024-03-16 NOTE — Telephone Encounter (Signed)
 Did you try the spouse's number on demographics as well? [phone]  [phone]

## 2024-03-16 NOTE — Telephone Encounter (Signed)
 Spoke to patient.  Coming in at 1

## 2024-03-16 NOTE — Telephone Encounter (Signed)
Patient is coming in this PM

## 2024-03-16 NOTE — Telephone Encounter (Signed)
 Patient wife called returning [facility] call. CB#[phone]

## 2024-03-16 NOTE — ED Notes (Signed)
 Knee rewrapped and new dressing placed per order and Clean dry and intact upon DC

## 2024-03-16 NOTE — Telephone Encounter (Signed)
 Yes, and also left vm with her also

## 2024-03-16 NOTE — Transitions of Care (Post Inpatient/ED Visit) (Signed)
   03/16/2024  Name: Justin Burnett MRN: [id number] DOB: August 21, 1965  Today's TOC FU Call Status: Today's TOC FU Call Status:: Unsuccessful Call (1st Attempt) Unsuccessful Call (1st Attempt) Date: 03/16/24  Attempted to reach the patient regarding the most recent Inpatient/ED visit.  Follow Up Plan: Additional outreach attempts will be made to reach the patient to complete the Transitions of Care (Post Inpatient/ED visit) call.   Signature  Slater Diesel, RN

## 2024-03-16 NOTE — Telephone Encounter (Signed)
 I have made multiple attempts to call patient and spouse this morning in regards to his right knee wound.

## 2024-03-16 NOTE — Progress Notes (Signed)
 Post-Op Visit Note   Patient: Justin Burnett           Date of Birth: 01/16/1966           MRN: [id number] Visit Date: 03/16/2024 PCP: [name], NP   Assessment & Plan:  Chief Complaint:  Chief Complaint  Patient presents with   Right Knee - Pain, Routine Post Op, Wound Check    03/14/24- Arthroplasty, Knee, Total - Right     Visit Diagnoses:  1. Wound dehiscence   2. Status post total right knee replacement     Plan: Patient is a pleasant 58 year old gentleman who comes in today following an injury to his right knee.  He is 2 days status post right total knee replacement 03/14/2024.  He was discharged from the hospital yesterday and unfortunately took a fall when he got home.  He was seen in the ED as he had a lot of blood coming from his knee after the fall.  X-rays were obtained which were negative for acute findings.  He is here today for follow-up.  He tells me he is still able to bear weight and is currently ambulating with a walker.  He has been taking oxycodone  for pain.  Examination of the right knee reveals slight wound dehiscence to the middle aspect.  1 small pea-sized area of slight bloody drainage.  He has moderate swelling and ecchymosis to the right lower extremity.  Calves are soft nontender.  He is neurovascularly intact distally.  Today, the wound was cleaned with Betadine .  I tried to reapproximate the incision by tightening the nylon sutures that were still in place, however I was unable to achieve approximation likely due to the Dermabond.  I then used a stapler to reapproximate the wound.  He tolerated this well.  I sent in doxycycline to take twice daily for the next 4 weeks.  He will take off of physical therapy for the next 1 to 2 days and strictly ice and elevate his knee for pain and swelling.  He will follow-up with us  at his regularly scheduled appointment 2 weeks postop.  Call with any concerns or questions in the meantime.  Follow-Up Instructions:  Return for at already scheduled appt.   Orders:  No orders of the defined types were placed in this encounter.  No orders of the defined types were placed in this encounter.   Imaging: DG Knee Complete 4 Views Right Result Date: 03/15/2024 CLINICAL DATA:  Status post fall. EXAM: RIGHT KNEE - COMPLETE 4+ VIEW COMPARISON:  March 14, 2024 FINDINGS: There is an intact right knee replacement. No evidence of an acute fracture or dislocation. Moderate severity anterior soft tissue swelling is seen. A large joint effusion is also noted. IMPRESSION: 1. Intact right knee replacement. 2. Moderate severity anterior soft tissue swelling with a large joint effusion. Electronically Signed   By: Suzen Dials M.D.   On: 03/15/2024 23:28    PMFS History: Patient Active Problem List   Diagnosis Date Noted   Status post total right knee replacement 03/14/2024   Status post total left knee replacement 10/26/2023   Preop cardiovascular exam 08/11/2023   Obesity (BMI 30.0-34.9) 08/11/2023   Cardiac murmur 08/11/2023   Cigarette smoker 08/11/2023   Prominent abdominal aortic pulsation 08/11/2023   Hypertension    Nerve damage    Neuromuscular disorder (HCC)    Abnormal EKG    Primary osteoarthritis of right knee 06/13/2022   Paresthesia 03/13/2022  Chronic bilateral low back pain without sciatica 03/13/2022   Hyperlipidemia 10/17/2021   Prediabetes 10/17/2021   Essential (primary) hypertension 09/26/2021   Neuropathy    Osteoarthritis 11/03/2011   Past Medical History:  Diagnosis Date   Abnormal EKG    Chronic bilateral low back pain without sciatica 03/13/2022   Essential (primary) hypertension 09/26/2021   Hyperlipidemia 10/17/2021   Hypertension    Inflammatory bowel disease    by 12/28/2023 colonoscopy   Nerve damage    Neuropathy    Osteoarthritis 11/03/2011   Osteoarthritis of knees, bilateral 06/13/2022   Paresthesia 03/13/2022   Prediabetes 10/17/2021    Family History   Problem Relation Age of Onset   Colon cancer Neg Hx    Esophageal cancer Neg Hx    Rectal cancer Neg Hx    Stomach cancer Neg Hx    Colon polyps Neg Hx     Past Surgical History:  Procedure Laterality Date   FINGER SURGERY Left    As a child. Tip of middle finger cut off in wood shop   TOTAL KNEE ARTHROPLASTY Left 10/26/2023   Procedure: ARTHROPLASTY, KNEE, TOTAL;  Surgeon: Jerri Kay HERO, MD;  Location: [facility] OR;  Service: Orthopedics;  Laterality: Left;   TOTAL KNEE ARTHROPLASTY Right 03/14/2024   Procedure: ARTHROPLASTY, KNEE, TOTAL;  Surgeon: Jerri Kay HERO, MD;  Location: [facility] OR;  Service: Orthopedics;  Laterality: Right;   Social History   Occupational History   Not on file  Tobacco Use   Smoking status: Every Day    Current packs/day: 0.50    Average packs/day: 0.5 packs/day for 13.0 years (6.5 ttl pk-yrs)    Types: Cigarettes    Passive exposure: Current   Smokeless tobacco: Never  Vaping Use   Vaping status: Never Used  Substance and Sexual Activity   Alcohol use: Not Currently    Alcohol/week: 3.0 standard drinks of alcohol    Types: 3 Cans of beer per week    Comment: 1 pint liquer per week   Drug use: No   Sexual activity: Not Currently

## 2024-03-17 ENCOUNTER — Telehealth: Payer: Self-pay | Admitting: Physician Assistant

## 2024-03-17 ENCOUNTER — Telehealth: Payer: Self-pay

## 2024-03-17 NOTE — Telephone Encounter (Signed)
 A nurse working with the pt called and stated the pt was using ice and compression and their dressing has become so saturated to the point where his wife is going out to go get new dressings. She also stated the pt told her he is not taking his Eliquis 

## 2024-03-17 NOTE — Telephone Encounter (Signed)
 Spoke with Chyrl and relayed. He will reach back out to patient next week for scheduling.

## 2024-03-17 NOTE — Transitions of Care (Post Inpatient/ED Visit) (Signed)
   03/17/2024  Name: Justin Burnett MRN: [id number] DOB: 12-17-1965  Today's TOC FU Call Status: Today's TOC FU Call Status:: Unsuccessful Call (2nd Attempt) Unsuccessful Call (1st Attempt) Date: 03/16/24 Unsuccessful Call (2nd Attempt) Date: 03/17/24  Attempted to reach the patient regarding the most recent Inpatient/ED visit.  Follow Up Plan: Additional outreach attempts will be made to reach the patient to complete the Transitions of Care (Post Inpatient/ED visit) call.   Signature  Slater Diesel, RN

## 2024-03-17 NOTE — Telephone Encounter (Signed)
 FYI

## 2024-03-17 NOTE — Transitions of Care (Post Inpatient/ED Visit) (Signed)
 03/17/2024  Name: Justin Burnett MRN: [id number] DOB: Aug 18, 1965  Today's TOC FU Call Status: Today's TOC FU Call Status:: Successful TOC FU Call Completed Unsuccessful Call (1st Attempt) Date: 03/16/24 Unsuccessful Call (2nd Attempt) Date: 03/17/24 [facility] FU Call Complete Date: 03/17/24 [name] and Date of Birth confirmed.  Transition Care Management Follow-up Telephone Call Date of Discharge: 03/15/24 Discharge Facility: [facility]) Type of Discharge: Inpatient Admission Primary Inpatient Discharge Diagnosis:: osteoarthritis right knee, s/p right TKA-  returned to ED 03/15/2024 after a fall. How have you been since you were released from the hospital?: Same (He said he is still having issues with his leg.) Any questions or concerns?: Yes Patient Questions/Concerns:: He explained that last night he used the compression machine with ice and today he has bleeding from the suture site and the dressing is saturated with blood.  He said that his wife has gone to get some dressing supplies and he will call the orthopedic surgeon if it is still bleeding tomorrow.  While reviewing the med list, he said he is not taking the eliquis  because of the bleeding; however, no one told him to stop taking it.  I told him that he needs to let the orthopedic surgeon know that he is not taking it. Patient Questions/Concerns Addressed: Notified Provider of Patient Questions/Concerns (I called [facility] and spoke to [place], [place] and informed her that the patient is not taking the eliquis  and that he is having more bleeding from his surgical site and he believes it is due to the fact he use the ice/compression last night.)  Items Reviewed: Did you receive and understand the discharge instructions provided?: Yes Medications obtained,verified, and reconciled?: Yes (Medications Reviewed) (He said he is taking the mesalamine  and robaxin ,it is noted in the prior med review that he was not taking them.  He said he is not taking the eliquis  because his knee is still bleeding) Any new allergies since your discharge?: No Dietary orders reviewed?: Yes Type of Diet Ordered:: heart healthy, low sodium Do you have support at home?: Yes People in Home [RPT]: spouse Name of Support/Comfort Primary Source: his wife  Medications Reviewed Today: Medications Reviewed Today     Reviewed by Marvis Bradley, RN (Case Manager) on 03/17/24 at 1658  Med List Status: <None>   Medication Order Taking? Sig Documenting Provider Last Dose Status Informant  amLODipine  (NORVASC ) 10 MG tablet [id number]  Take 1 tablet (10 mg total) by mouth daily. [name], NP  Active Self  apixaban  (ELIQUIS ) 2.5 MG TABS tablet [id number]  Take 1 tablet (2.5 mg total) by mouth 2 (two) times daily for 30 days after surgery to prevent blood clots Jule Ronal CROME, PA-C  Active   Ascorbic Acid (VITAMIN C) 1000 MG tablet [id number]  Take 1,000 mg by mouth daily. [provider]  Active Self  atorvastatin  (LIPITOR) 40 MG tablet [id number]  Take 1 tablet (40 mg total) by mouth daily. [name], NP  Active Self  Cholecalciferol 25 MCG (1000 UT) capsule [id number]  Take 1,000 Units by mouth daily. [provider]  Active Self  docusate sodium  (COLACE) 100 MG capsule [id number]  Take 1 capsule (100 mg total) by mouth daily as needed.  Patient not taking: Reported on 03/08/2024   Jule Ronal CROME, PA-C  Active Self  doxycycline (VIBRA-TABS) 100 MG tablet [id number]  Take 1 tablet (100 mg total) by mouth 2 (two) times daily. Jule Ronal CROME, PA-C  Active  gabapentin  (NEURONTIN ) 800 MG tablet [id number]  Take 1 tablet (800 mg total) by mouth 2 (two) times daily. [name], NP  Active Self  mesalamine  (LIALDA ) 1.2 g EC tablet [id number]  Take 4 tablets (4.8 g total) by mouth daily with breakfast.  Patient not taking: Reported on 03/08/2024   Abran Norleen SAILOR, MD  Active Self  methocarbamol  (ROBAXIN ) 750 MG tablet  [id number]  Take 1 tablet (750 mg total) by mouth 3 (three) times daily as needed for muscle spasms.  Patient not taking: Reported on 03/08/2024   Jule Ronal CROME, PA-C  Active Self  mupirocin  ointment (BACTROBAN ) 2 % [id number]  Apply 1 Application topically 2 (two) times daily.  Patient not taking: No sig reported   [name], NP  Active Self  ondansetron  (ZOFRAN ) 4 MG tablet [id number]  Take 1 tablet (4 mg total) by mouth every 8 (eight) hours as needed for nausea or vomiting. Jule Ronal CROME, PA-C  Active Self           Med Note SOILA, LYLE BROCKS   Tue Mar 08, 2024  1:49 PM) For use post procedure  oxyCODONE -acetaminophen  (PERCOCET) 5-325 MG tablet [id number]  Take 1-2 tablets by mouth every 8 (eight) hours as needed. (to be taken after surgery) Jule Ronal CROME, PA-C  Active Self           Med Note SOILA, LYLE BROCKS   Tue Mar 08, 2024  1:49 PM) For use post procedure  silver  sulfADIAZINE  (SILVADENE ) 1 % cream [id number]  Apply 1 Application topically 2 (two) times daily.  Patient not taking: Reported on 03/08/2024   [name], NP  Active Self            Home Care and Equipment/Supplies: Were Home Health Services Ordered?: Yes Name of Home Health Agency:: [facility] Has Agency set up a time to come to your home?: Yes First Home Health Visit Date:  (He said that the agency called him to schedule an appointment but he declined due to the fall he had afte discharge and wound dehiscence. He said he wants them to hold off on PT for now) Any new equipment or medical supplies ordered?: Yes Name of Medical supply agency?: [facility],   he also received an ice machine, Were you able to get the equipment/medical supplies?: Yes Do you have any questions related to the use of the equipment/supplies?: No  Functional Questionnaire: Do you need assistance with bathing/showering or dressing?: No Do you need assistance with meal preparation?: Yes (his wife assists as needed) Do you need  assistance with eating?: No Do you have difficulty maintaining continence: No Do you need assistance with getting out of bed/getting out of a chair/moving?: Yes (amblates with RW) Do you have difficulty managing or taking your medications?: No  Follow up appointments reviewed: PCP Follow-up appointment confirmed?: No MD Provider Line Number:[phone] Given: No (He said he will call to schedule an appointment when he is ready) Specialist Hospital Follow-up appointment confirmed?: Yes Date of Specialist follow-up appointment?: 03/29/24 Follow-Up Specialty Provider:: orthopedic surgery.  He was also seen by orthopedic surgeon yesterday,     03/31/2024- GI appointment Do you need transportation to your follow-up appointment?: No Do you understand care options if your condition(s) worsen?: Yes-patient verbalized understanding    SIGNATURE Slater Diesel, RN

## 2024-03-17 NOTE — Telephone Encounter (Signed)
 Chyrl (PT) from [facility] called stated he contacted pt to start. Pt and pt declined for now due to fall. Chyrl will call when pt wants to resume PT. Chyrl secure number is [phone].

## 2024-03-17 NOTE — Telephone Encounter (Signed)
 I told patient to take a few days off, but he should be able to restart over the weekend

## 2024-03-17 NOTE — Telephone Encounter (Signed)
 Spoke to nurse and then called and spoke to patient.  Started using ice compression wrap and think he had too much compression which caused his bandage to become wet.  His wife is picking up new bandage material.  He denies any active bleeding.  He has not been taking eliquis , but we have decided to take a baby aspirin bid instead.  He has been taking his abx.  He will call us  in the am if needed

## 2024-03-23 ENCOUNTER — Other Ambulatory Visit (HOSPITAL_COMMUNITY): Payer: Self-pay

## 2024-03-24 ENCOUNTER — Other Ambulatory Visit: Payer: Self-pay | Admitting: Family

## 2024-03-24 ENCOUNTER — Encounter (HOSPITAL_COMMUNITY): Payer: Self-pay

## 2024-03-24 ENCOUNTER — Other Ambulatory Visit: Payer: Self-pay | Admitting: Physician Assistant

## 2024-03-24 ENCOUNTER — Telehealth: Payer: Self-pay | Admitting: Physician Assistant

## 2024-03-24 ENCOUNTER — Other Ambulatory Visit (HOSPITAL_COMMUNITY): Payer: Self-pay

## 2024-03-24 DIAGNOSIS — K0889 Other specified disorders of teeth and supporting structures: Secondary | ICD-10-CM

## 2024-03-24 MED ORDER — OXYCODONE-ACETAMINOPHEN 5-325 MG PO TABS
1.0000 | ORAL_TABLET | Freq: Three times a day (TID) | ORAL | 0 refills | Status: DC | PRN
  Filled 2024-03-24: qty 30, 5d supply, fill #0

## 2024-03-24 NOTE — Telephone Encounter (Signed)
 sent

## 2024-03-24 NOTE — Telephone Encounter (Signed)
 Patient called and he needs a refill on the Oxycodone  and muscle relaxers. CB#[phone]

## 2024-03-24 NOTE — Telephone Encounter (Signed)
 Pain medicine was sent in earlier, looks like patient called in and also needs the muscle relaxer.

## 2024-03-25 ENCOUNTER — Encounter: Payer: Self-pay | Admitting: Family

## 2024-03-25 ENCOUNTER — Other Ambulatory Visit: Payer: Self-pay | Admitting: Physician Assistant

## 2024-03-25 ENCOUNTER — Other Ambulatory Visit (HOSPITAL_COMMUNITY): Payer: Self-pay

## 2024-03-25 ENCOUNTER — Telehealth: Payer: Self-pay | Admitting: Orthopaedic Surgery

## 2024-03-25 NOTE — Telephone Encounter (Signed)
 Muscle relaxer has two refills on the bottle.  He just needs to let pharmacy know he needs a refill

## 2024-03-25 NOTE — Telephone Encounter (Signed)
 Pt called stating that they need a refill of oxycodone  800mg  Pharmacy is [facility] [address]. Pt call back number is [phone]

## 2024-03-25 NOTE — Telephone Encounter (Signed)
 Called patient. No answer. LMOM that oxycodone  was sent in yesterday to his pharmacy. The muscle relaxer has 2 refills remaining, so pharmacy can fill it.

## 2024-03-29 ENCOUNTER — Encounter: Payer: Self-pay | Admitting: Physician Assistant

## 2024-03-29 ENCOUNTER — Ambulatory Visit: Admitting: Physician Assistant

## 2024-03-29 DIAGNOSIS — Z96651 Presence of right artificial knee joint: Secondary | ICD-10-CM

## 2024-03-29 NOTE — Progress Notes (Addendum)
 Post-Op Visit Note   Patient: Justin Burnett           Date of Birth: 05/20/1966           MRN: [id number] Visit Date: 03/29/2024 PCP: [name], NP   Assessment & Plan:  Chief Complaint:  Chief Complaint  Patient presents with   Right Knee - Follow-up    Right TKA 03/14/2024   Visit Diagnoses:  1. Status post total right knee replacement     Plan: Patient is a pleasant 58 year old gentleman who comes in today 2 weeks status post right total knee replacement 03/14/2024.  I saw him 2 days postop after he had taken a fall once discharged from home where he had a partial wound dehiscence.  This area was cleaned and stapled in the office.  He was prescribed doxycycline for which she has been compliant taking.  At that time, I had him stop his Eliquis  and stop physical therapy.  At today's visit he is doing much better.  He has not had any recurrent falls.  He does not have any drainage from the wound.  He has been taking oxycodone  and Tylenol  for pain.  Examination of the right knee reveals a well-healed surgical incision without complication.  Calf is soft nontender.  He does have moderate swelling the right lower extremity.  Today, sutures and staples were removed and Steri-Strips applied.  Xeroform and a Tegaderm was applied to the area of raw skin.  He will continue with his doxycycline for 2 more weeks.  I have had him restart his Eliquis  for which she will take for 2 weeks and then transition to a baby aspirin twice daily for 2 weeks.  He already has a referral to [facility] health outpatient PT on [facility] and will give them a call to schedule sessions.  He will continue with a home exercise program for now.  Follow up in two weeks for wound check.   Follow-Up Instructions: Return in about 2 weeks (around 04/12/2024).   Orders:  No orders of the defined types were placed in this encounter.  No orders of the defined types were placed in this encounter.   Imaging: No new  imaging  PMFS History: Patient Active Problem List   Diagnosis Date Noted   Status post total right knee replacement 03/14/2024   Status post total left knee replacement 10/26/2023   Preop cardiovascular exam 08/11/2023   Obesity (BMI 30.0-34.9) 08/11/2023   Cardiac murmur 08/11/2023   Cigarette smoker 08/11/2023   Prominent abdominal aortic pulsation 08/11/2023   Hypertension    Nerve damage    Neuromuscular disorder (HCC)    Abnormal EKG    Primary osteoarthritis of right knee 06/13/2022   Paresthesia 03/13/2022   Chronic bilateral low back pain without sciatica 03/13/2022   Hyperlipidemia 10/17/2021   Prediabetes 10/17/2021   Essential (primary) hypertension 09/26/2021   Neuropathy    Osteoarthritis 11/03/2011   Past Medical History:  Diagnosis Date   Abnormal EKG    Chronic bilateral low back pain without sciatica 03/13/2022   Essential (primary) hypertension 09/26/2021   Hyperlipidemia 10/17/2021   Hypertension    Inflammatory bowel disease    by 12/28/2023 colonoscopy   Nerve damage    Neuropathy    Osteoarthritis 11/03/2011   Osteoarthritis of knees, bilateral 06/13/2022   Paresthesia 03/13/2022   Prediabetes 10/17/2021    Family History  Problem Relation Age of Onset   Colon cancer Neg Hx  Esophageal cancer Neg Hx    Rectal cancer Neg Hx    Stomach cancer Neg Hx    Colon polyps Neg Hx     Past Surgical History:  Procedure Laterality Date   FINGER SURGERY Left    As a child. Tip of middle finger cut off in wood shop   TOTAL KNEE ARTHROPLASTY Left 10/26/2023   Procedure: ARTHROPLASTY, KNEE, TOTAL;  Surgeon: Jerri Kay HERO, MD;  Location: [facility] OR;  Service: Orthopedics;  Laterality: Left;   TOTAL KNEE ARTHROPLASTY Right 03/14/2024   Procedure: ARTHROPLASTY, KNEE, TOTAL;  Surgeon: Jerri Kay HERO, MD;  Location: [facility] OR;  Service: Orthopedics;  Laterality: Right;   Social History   Occupational History   Not on file  Tobacco Use   Smoking status: Every Day     Current packs/day: 0.50    Average packs/day: 0.5 packs/day for 13.0 years (6.5 ttl pk-yrs)    Types: Cigarettes    Passive exposure: Current   Smokeless tobacco: Never  Vaping Use   Vaping status: Never Used  Substance and Sexual Activity   Alcohol use: Not Currently    Alcohol/week: 3.0 standard drinks of alcohol    Types: 3 Cans of beer per week    Comment: 1 pint liquer per week   Drug use: No   Sexual activity: Not Currently

## 2024-03-31 ENCOUNTER — Ambulatory Visit (INDEPENDENT_AMBULATORY_CARE_PROVIDER_SITE_OTHER): Admitting: Internal Medicine

## 2024-03-31 ENCOUNTER — Other Ambulatory Visit (HOSPITAL_COMMUNITY): Payer: Self-pay

## 2024-03-31 ENCOUNTER — Encounter: Payer: Self-pay | Admitting: Internal Medicine

## 2024-03-31 ENCOUNTER — Other Ambulatory Visit: Payer: Self-pay

## 2024-03-31 VITALS — BP 126/70 | HR 84 | Ht 72.0 in | Wt 246.0 lb

## 2024-03-31 DIAGNOSIS — K5150 Left sided colitis without complications: Secondary | ICD-10-CM | POA: Diagnosis not present

## 2024-03-31 MED ORDER — MESALAMINE 1.2 G PO TBEC
4.8000 g | DELAYED_RELEASE_TABLET | Freq: Every day | ORAL | 3 refills | Status: AC
  Filled 2024-03-31: qty 360, 90d supply, fill #0
  Filled 2024-07-14: qty 360, 90d supply, fill #1

## 2024-03-31 NOTE — Patient Instructions (Signed)
 We have sent the following medications to your pharmacy for you to pick up at your convenience:  Lialda   Please follow up 6 months.  _______________________________________________________  If your blood pressure at your visit was 140/90 or greater, please contact your primary care physician to follow up on this.  _______________________________________________________  If you are age 60 or older, your body mass index should be between 23-30. Your Body mass index is 33.36 kg/m. If this is out of the aforementioned range listed, please consider follow up with your Primary Care Provider.  If you are age 61 or younger, your body mass index should be between 19-25. Your Body mass index is 33.36 kg/m. If this is out of the aformentioned range listed, please consider follow up with your Primary Care Provider.   ________________________________________________________  The [facility] providers would like to encourage you to use MYCHART to communicate with providers for non-urgent requests or questions.  Due to long hold times on the telephone, sending your provider a message by [facility] may be a faster and more efficient way to get a response.  Please allow 48 business hours for a response.  Please remember that this is for non-urgent requests.  _______________________________________________________  [facility] is using a team-based approach to care.  Your team is made up of your doctor and two to three APPS. Our APPS (Nurse Practitioners and Physician Assistants) work with your physician to ensure care continuity for you. They are fully qualified to address your health concerns and develop a treatment plan. They communicate directly with your gastroenterologist to care for you. Seeing the Advanced Practice Practitioners on your physician's team can help you by facilitating care more promptly, often allowing for earlier appointments, access to diagnostic testing, procedures, and other  specialty referrals.

## 2024-03-31 NOTE — Progress Notes (Signed)
 HISTORY OF PRESENT ILLNESS:  Justin Burnett is a 58 y.o. male, longstanding [employer] fan, who presents today for follow-up regarding newly diagnosed left-sided ulcerative colitis December 28, 2023 after he was sent for colonoscopy because of positive Cologuard testing.  He was last seen in the office February 03, 2024.  See that dictation for details.  He was placed on a 40-day prednisone  taper which he completed about 2 weeks ago.  He was also placed on Lialda  4.8 g daily.  He took 1 prescription then stopped.  He is accompanied today by his wife.  Patient tells me that bowel habits have returned to normal.  He successfully completed right knee surgery about 2 weeks ago.  Currently having 2 solid bowel movements per day.  No blood or mucus.  No abdominal distress.  No new complaints or issues  REVIEW OF SYSTEMS:  All non-GI ROS negative. Past Medical History:  Diagnosis Date   Abnormal EKG    Chronic bilateral low back pain without sciatica 03/13/2022   Essential (primary) hypertension 09/26/2021   Hyperlipidemia 10/17/2021   Hypertension    Inflammatory bowel disease    by 12/28/2023 colonoscopy   Nerve damage    Neuropathy    Osteoarthritis 11/03/2011   Osteoarthritis of knees, bilateral 06/13/2022   Paresthesia 03/13/2022   Prediabetes 10/17/2021    Past Surgical History:  Procedure Laterality Date   FINGER SURGERY Left    As a child. Tip of middle finger cut off in wood shop   TOTAL KNEE ARTHROPLASTY Left 10/26/2023   Procedure: ARTHROPLASTY, KNEE, TOTAL;  Surgeon: Justin Kay HERO, MD;  Location: [facility] OR;  Service: Orthopedics;  Laterality: Left;   TOTAL KNEE ARTHROPLASTY Right 03/14/2024   Procedure: ARTHROPLASTY, KNEE, TOTAL;  Surgeon: Justin Kay HERO, MD;  Location: [facility] OR;  Service: Orthopedics;  Laterality: Right;    Social History Justin Burnett  reports that he has been smoking cigarettes. He has a 6.5 pack-year smoking history. He has been exposed to tobacco smoke. He  has never used smokeless tobacco. He reports that he does not currently use alcohol after a past usage of about 3.0 standard drinks of alcohol per week. He reports that he does not use drugs.  family history is not on file.  Allergies  Allergen Reactions   Meperidine Other (See Comments)    Causes seizure-like activities Demerol   Meperidine Hcl Other (See Comments)       PHYSICAL EXAMINATION: Vital signs: BP 126/70   Pulse 84   Ht 6' (1.829 m)   Wt 246 lb (111.6 kg)   BMI 33.36 kg/m  General: Well-developed, well-nourished, no acute distress HEENT: Sclerae are anicteric, conjunctiva pink. Oral mucosa intact Lungs: Clear Heart: Regular Abdomen: soft, nontender, nondistended, no obvious ascites, no peritoneal signs, normal bowel sounds. No organomegaly. Extremities: Right knee wrapped Psychiatric: alert and oriented x3. Cooperative   ASSESSMENT:  1.  Left-sided ulcerative colitis diagnosed July 2025.  Improved after completing a course of prednisone .  Was on mesalamine  but stopped thinking that he was supposed to complete just 1 prescription. 2.  Diminutive adenomatous polyp in the right colon.  Sporadic adenomas. 3.  General Medical problems.  Stable   PLAN:  1.  Resume Lialda  4.8 g daily.  Prescription has multiple refills.  Medication risks reviewed 2.  Routine GI follow-up 6 months.  Contact the office in the interim for any questions or problems. A total time of 30 minutes was spent preparing to see  the patient, obtaining comprehensive history, performing medically appropriate physical exam, counseling and educating the patient regarding the above listed issues, ordering medication, arranging follow-up, and documenting clinical information in the health record

## 2024-04-04 ENCOUNTER — Other Ambulatory Visit: Payer: Self-pay

## 2024-04-04 ENCOUNTER — Other Ambulatory Visit (HOSPITAL_COMMUNITY): Payer: Self-pay

## 2024-04-06 ENCOUNTER — Other Ambulatory Visit: Payer: Self-pay | Admitting: Physician Assistant

## 2024-04-06 ENCOUNTER — Telehealth: Payer: Self-pay | Admitting: Orthopaedic Surgery

## 2024-04-06 ENCOUNTER — Other Ambulatory Visit (HOSPITAL_COMMUNITY): Payer: Self-pay

## 2024-04-06 MED ORDER — OXYCODONE-ACETAMINOPHEN 5-325 MG PO TABS
1.0000 | ORAL_TABLET | Freq: Two times a day (BID) | ORAL | 0 refills | Status: DC | PRN
  Filled 2024-04-06: qty 20, 5d supply, fill #0

## 2024-04-06 NOTE — Telephone Encounter (Signed)
 Patient aware

## 2024-04-06 NOTE — Telephone Encounter (Signed)
 Sent in refill of percocet and weaned to bid

## 2024-04-06 NOTE — Telephone Encounter (Signed)
 Pt called stating that he does need a refill on the Oxycodone . [facility] is [facility] [address]. Pt would like a call back when called in. Pt call back number is [phone]

## 2024-04-06 NOTE — Telephone Encounter (Signed)
 Sent and weaned to bid

## 2024-04-07 ENCOUNTER — Encounter: Payer: Self-pay | Admitting: Physical Therapy

## 2024-04-07 ENCOUNTER — Other Ambulatory Visit: Payer: Self-pay

## 2024-04-07 ENCOUNTER — Ambulatory Visit: Attending: Physician Assistant | Admitting: Physical Therapy

## 2024-04-07 DIAGNOSIS — R600 Localized edema: Secondary | ICD-10-CM | POA: Insufficient documentation

## 2024-04-07 DIAGNOSIS — M6281 Muscle weakness (generalized): Secondary | ICD-10-CM | POA: Diagnosis not present

## 2024-04-07 DIAGNOSIS — M25561 Pain in right knee: Secondary | ICD-10-CM | POA: Diagnosis not present

## 2024-04-07 DIAGNOSIS — G8929 Other chronic pain: Secondary | ICD-10-CM | POA: Insufficient documentation

## 2024-04-07 DIAGNOSIS — M1711 Unilateral primary osteoarthritis, right knee: Secondary | ICD-10-CM | POA: Diagnosis not present

## 2024-04-07 DIAGNOSIS — R2689 Other abnormalities of gait and mobility: Secondary | ICD-10-CM | POA: Diagnosis not present

## 2024-04-07 NOTE — Therapy (Signed)
 OUTPATIENT PHYSICAL THERAPY LOWER EXTREMITY EVALUATION   Patient Name: Justin Burnett MRN: [id number] DOB:[birth year], 58 y.o., male Today's Date: 04/07/2024  END OF SESSION:  PT End of Session - 04/07/24 0930     Visit Number 1    Number of Visits 17    Date for Recertification  06/02/24    Authorization Type MCD    PT Start Time 0930    Activity Tolerance Patient tolerated treatment well    Behavior During Therapy [facility] for tasks assessed/performed          Past Medical History:  Diagnosis Date   Abnormal EKG    Chronic bilateral low back pain without sciatica 03/13/2022   Essential (primary) hypertension 09/26/2021   Hyperlipidemia 10/17/2021   Hypertension    Inflammatory bowel disease    by 12/28/2023 colonoscopy   Nerve damage    Neuropathy    Osteoarthritis 11/03/2011   Osteoarthritis of knees, bilateral 06/13/2022   Paresthesia 03/13/2022   Prediabetes 10/17/2021   Past Surgical History:  Procedure Laterality Date   FINGER SURGERY Left    As a child. Tip of middle finger cut off in wood shop   TOTAL KNEE ARTHROPLASTY Left 10/26/2023   Procedure: ARTHROPLASTY, KNEE, TOTAL;  Surgeon: Justin Kay HERO, MD;  Location: [facility];  Service: Orthopedics;  Laterality: Left;   TOTAL KNEE ARTHROPLASTY Right 03/14/2024   Procedure: ARTHROPLASTY, KNEE, TOTAL;  Surgeon: Justin Kay HERO, MD;  Location: [facility] OR;  Service: Orthopedics;  Laterality: Right;   Patient Active Problem List   Diagnosis Date Noted   Status post total right knee replacement 03/14/2024   Status post total left knee replacement 10/26/2023   Preop cardiovascular exam 08/11/2023   Obesity (BMI 30.0-34.9) 08/11/2023   Cardiac murmur 08/11/2023   Cigarette smoker 08/11/2023   Prominent abdominal aortic pulsation 08/11/2023   Hypertension    Nerve damage    Neuromuscular disorder (HCC)    Abnormal EKG    Primary osteoarthritis of right knee 06/13/2022   Paresthesia 03/13/2022   Chronic bilateral low back pain  without sciatica 03/13/2022   Hyperlipidemia 10/17/2021   Prediabetes 10/17/2021   Essential (primary) hypertension 09/26/2021   Neuropathy    Osteoarthritis 11/03/2011    PCP: Justin Greig PARAS, NP   REFERRING PROVIDER: Jerri Kay HERO, MD   REFERRING DIAG: Primary osteoarthritis of right knee [M17.11]   THERAPY DIAG:  Chronic pain of right knee  Muscle weakness (generalized)  Other abnormalities of gait and mobility  Localized edema  Rationale for Evaluation and Treatment: Rehabilitation  ONSET DATE: DOS 03/14/24  SUBJECTIVE:   SUBJECTIVE STATEMENT: Pt arrives to PT today with hx of R TKA on 03/14/24. He was D/C home on 9/30 and returned later that day to the ED due to a fall, he reports it took him a bit to come in due to falling. Overall he reports doing okay, biggest issue is the pain management, he reports pain today is 3/10 and denies any N/T, currently ambulates with RW. He reports doing some exercises at home home tightening quad, toe raises.   PERTINENT HISTORY: See PMHx PAIN:  Are you having pain? Yes: NPRS scale: 3/10 (took pain meds at 6:30), worse 9-10/10 Pain location: around the knee cap Pain description: aching, sore, tight Aggravating factors: standing, walking, bending the kned Relieving factors: Ice  PRECAUTIONS: None  RED FLAGS: None   WEIGHT BEARING RESTRICTIONS: No  FALLS:  Has patient fallen in last 6 months? Yes. Number of falls  1  LIVING ENVIRONMENT: Lives with: lives with their spouse Lives in: House/apartment Stairs: Yes: External: 5 steps; can reach both Has following equipment at home: Walker - 2 wheeled  OCCUPATION: Unemployeed  PLOF: Independent  PATIENT GOALS: reduce, get back to doing daily tasks, to be able to work without restrictions.    OBJECTIVE:  Note: Objective measures were completed at Evaluation unless otherwise noted.  DIAGNOSTIC FINDINGS:  Xray 03/15/24 R knee IMPRESSION: 1. Intact right knee  replacement. 2. Moderate severity anterior soft tissue swelling with a large joint effusion.  PATIENT SURVEYS:  LEFS  Extreme difficulty/unable (0), Quite a bit of difficulty (1), Moderate difficulty (2), Little difficulty (3), No difficulty (4) Survey date:  04/07/24  Any of your usual work, housework or school activities 1  2. Usual hobbies, recreational or sporting activities 1  3. Getting into/out of the bath 2  4. Walking between rooms 2  5. Putting on socks/shoes 2  6. Squatting  0  7. Lifting an object, like a bag of groceries from the floor 4  8. Performing light activities around your home 2  9. Performing heavy activities around your home 0  10. Getting into/out of a car 0  11. Walking 2 blocks 0  12. Walking 1 mile 0  13. Going up/down 10 stairs (1 flight) 0  14. Standing for 1 hour 2  15.  sitting for 1 hour 4  16. Running on even ground 0  17. Running on uneven ground 0  18. Making sharp turns while running fast 0  19. Hopping  0  20. Rolling over in bed 2  Score total:  22/80     COGNITION: Overall cognitive status: Within functional limits for tasks assessed     SENSATION: Not tested  EDEMA:  Unable to properly assess due to unable to roll pants up.    POSTURE: rounded shoulders and forward head  PALPATION: TTP   LOWER EXTREMITY ROM:  Active ROM Right eval Left eval  Hip flexion    Hip extension    Hip abduction    Hip adduction    Hip internal rotation    Hip external rotation    Knee flexion 95A, 100AA   Knee extension 3 A   Ankle dorsiflexion    Ankle plantarflexion    Ankle inversion    Ankle eversion     (Blank rows = not tested)  LOWER EXTREMITY MMT:  MMT Right eval Left eval  Hip flexion 4-/5 4+/5  Hip extension    Hip abduction    Hip adduction    Hip internal rotation    Hip external rotation    Knee flexion 3-/5 4+/5  Knee extension 3-/5 4+/5  Ankle dorsiflexion    Ankle plantarflexion    Ankle inversion     Ankle eversion     (Blank rows = not tested)  FUNCTIONAL TESTS:  5 times sit to stand: 30 sec  GAIT: Distance walked: from waiting room Assistive device utilized: Walker - 2 wheeled Level of assistance: Modified independence Comments: descreased stance on the RLE, and step length with LLE  TREATMENT:  [facility] Adult PT Treatment:                                                DATE: 04/07/2024 Heel slide supine with strap 1 x 10  Attempted SLR but unable to perform Seated hamstring stretch 1 x 30 sec Provided initial HEP    PATIENT EDUCATION:  Education details: evaluation findings, POC, goals, HEP with proper form/ rationale Person educated: Patient Education method: Explanation and Handouts Education comprehension: verbalized understanding  HOME EXERCISE PROGRAM: Access Code: [email or web address] URL: [email or web address] Date: 04/07/2024 Prepared by: Joneen Fresh  Exercises - Supine Heel Slide with Strap  - 1 x daily - 7 x weekly - 2 sets - 10 reps - 5 seconds hold - Seated Heel Slide  - 1 x daily - 7 x weekly - 2 sets - 10 reps - hold 5 sec hold - Seated Hamstring Stretch  - 1 x daily - 7 x weekly - 2 sets - 2 reps - 30 hold - Seated Long Arc Quad  - 1 x daily - 7 x weekly - 2 sets - 10 reps - Seated Heel Toe Raises  - 1 x daily - 7 x weekly - 2 sets - 10 reps  ASSESSMENT:  CLINICAL IMPRESSION: Patient is a 58 y.o. M who was seen today for physical therapy evaluation and treatment for s/p TKA on 03/14/24. After being discharged from the hospital he had a fall and returned to the ED for further assessment. Increased time from referral to PT evaluation due to pt noting he would call to schedule in a few weeks which he states was due to the fall. R knee total arc ROM is 3-95 degrees, MMT reveals gross weakness in the RLE. TTP located  peri-patellar, with continued swelling noted visually compared bil. He currently ambualtes with RW with limiited stance on RLE and step length on LLE. He would benefit from physical therapy to decrease R knee pain, improve ROM and strength, maximize balance/ stability and overall function by addressing the deficits listed.   OBJECTIVE IMPAIRMENTS: difficulty walking, decreased ROM, decreased strength, increased edema, increased fascial restrictions, postural dysfunction, and pain.   ACTIVITY LIMITATIONS: carrying, lifting, standing, squatting, stairs, and locomotion level  PARTICIPATION LIMITATIONS: driving, community activity, and occupation  PERSONAL FACTORS: Past/current experiences and 1 comorbidity: hx of pre-diabetes are also affecting patient's functional outcome.   REHAB POTENTIAL: Good  CLINICAL DECISION MAKING: Evolving/moderate complexity  EVALUATION COMPLEXITY: Moderate   GOALS: Goals reviewed with patient? Yes  SHORT TERM GOALS: Target date: 05/05/2024   Pt to be IND with initial HEP for therapeutic progression  Baseline: no prior HEP Goal status: INITIAL  2.  Increase knee flexion to >/= 110 degress to promote functional ROM.  Baseline:  Goal status: INITIAL  3.  Pt to verbalize / demo efficient gait pattern with LRAD to maximize safety Baseline: RW with decreased stance time on RLE/ step length LLE Goal status: INITIAL  4.  Improve LEFS to >/= 32/80 to demonstrate improving function Baseline: 22/80 Goal status: INITIAL   LONG TERM GOALS: Target date: 06/02/2024   Improve R knee total arc ROM to 0 - 120 for functional ROM required for ADLs Baseline:  Goal status: INITIAL  2.  Pt to be able to stand/ walk for >/= 60 min with LRAD with no report of  pain or limitations to promote endurance required for work and ADLs Baseline: unable to stand or walk for 10-15 min  Goal status: INITIAL  3.  Improve LEFS to >/= 60/80 to demo improvement in  function Baseline:  Goal status: INITIAL  4.  Pt to be able to perform every day tasks/ ADLs independenlty with no report of pain or limitations per his personal goals.  Baseline:  Goal status: INITIAL  4.  Pt to be IND with all HEP given and is able to maintain and progress her current LOF IND.  Baseline: no prior HEP Goal status: INITIAL PLAN:  PT FREQUENCY: 1-2x/week  PT DURATION: 12 weeks  PLANNED INTERVENTIONS: 97110-Therapeutic exercises, 97530- Therapeutic activity, [id number]- Neuromuscular re-education, 97535- Self Care, [id number]- Manual therapy, [id number]- Gait training, [phone]- Aquatic Therapy, [phone]- Electrical stimulation (unattended), 97016- Vasopneumatic device, 20560 (1-2 muscles), 20561 (3+ muscles)- Dry Needling, Patient/Family education, Stair training, Taping, Joint mobilization, Cryotherapy, and Moist heat  PLAN FOR NEXT SESSION: Review/ update HEP PRN. Knee ROM, strength, gait training, modalities PRN.    Keiandra Sullenger PT, DPT, LAT, ATC  04/07/24  10:31 AM    For all possible CPT codes, reference the Planned Interventions line above.     Check all conditions that are expected to impact treatment: {Conditions expected to impact treatment:Diabetes mellitus and Musculoskeletal disorders   If treatment provided at initial evaluation, no treatment charged due to lack of authorization.

## 2024-04-12 ENCOUNTER — Ambulatory Visit: Admitting: Physician Assistant

## 2024-04-12 ENCOUNTER — Other Ambulatory Visit (HOSPITAL_COMMUNITY): Payer: Self-pay

## 2024-04-12 DIAGNOSIS — Z96651 Presence of right artificial knee joint: Secondary | ICD-10-CM

## 2024-04-12 MED ORDER — HYDROCODONE-ACETAMINOPHEN 5-325 MG PO TABS
1.0000 | ORAL_TABLET | Freq: Two times a day (BID) | ORAL | 0 refills | Status: DC | PRN
  Filled 2024-04-12: qty 28, 7d supply, fill #0

## 2024-04-12 NOTE — Progress Notes (Signed)
 Post-Op Visit Note   Patient: Justin Burnett           Date of Birth: 10-24-1965           MRN: [id number] Visit Date: 04/12/2024 PCP: [name], NP   Assessment & Plan:  Chief Complaint:  Chief Complaint  Patient presents with   Right Knee - Pain   Visit Diagnoses:  1. Status post total right knee replacement     Plan: Patient is a pleasant 58 year old gentleman who comes in today 4 weeks status post right total knee replacement.  He has been doing okay.  His wound has healed and he has almost completed his doxycycline.  He has started outpatient physical therapy and is ambulating with a walker.  He has been compliant taking his Eliquis  for DVT prophylaxis.  He has been taking oxycodone  for pain.  Examination of his right knee reveals a well-healing surgical incision.  He does have scabs to a few areas.  No drainage or signs of infection or cellulitis.  Has had moderate swelling the right lower extremity.  Calf is nontender.  He is neurovascularly intact distally.  At this point, he will transition from Eliquis  to a baby aspirin twice daily for 2 weeks for DVT prophylaxis.  I have refilled his pain medicine.  He will continue to push things with physical therapy.  He will follow-up with Dr. Jerri in 2 weeks for repeat evaluation and 2 view x-rays of the right knee.  Call with concerns or questions.  Follow-Up Instructions: Return in about 2 weeks (around 04/26/2024) for with Dr. Jerri.   Orders:  No orders of the defined types were placed in this encounter.  Meds ordered this encounter  Medications   HYDROcodone -acetaminophen  (NORCO/VICODIN) 5-325 MG tablet    Sig: Take 1-2 tablets by mouth 2 (two) times daily as needed.    Dispense:  28 tablet    Refill:  0    Imaging: No new imaging  PMFS History: Patient Active Problem List   Diagnosis Date Noted   Status post total right knee replacement 03/14/2024   Status post total left knee replacement 10/26/2023   Preop  cardiovascular exam 08/11/2023   Obesity (BMI 30.0-34.9) 08/11/2023   Cardiac murmur 08/11/2023   Cigarette smoker 08/11/2023   Prominent abdominal aortic pulsation 08/11/2023   Hypertension    Nerve damage    Neuromuscular disorder (HCC)    Abnormal EKG    Primary osteoarthritis of right knee 06/13/2022   Paresthesia 03/13/2022   Chronic bilateral low back pain without sciatica 03/13/2022   Hyperlipidemia 10/17/2021   Prediabetes 10/17/2021   Essential (primary) hypertension 09/26/2021   Neuropathy    Osteoarthritis 11/03/2011   Past Medical History:  Diagnosis Date   Abnormal EKG    Chronic bilateral low back pain without sciatica 03/13/2022   Essential (primary) hypertension 09/26/2021   Hyperlipidemia 10/17/2021   Hypertension    Inflammatory bowel disease    by 12/28/2023 colonoscopy   Nerve damage    Neuropathy    Osteoarthritis 11/03/2011   Osteoarthritis of knees, bilateral 06/13/2022   Paresthesia 03/13/2022   Prediabetes 10/17/2021    Family History  Problem Relation Age of Onset   Colon cancer Neg Hx    Esophageal cancer Neg Hx    Rectal cancer Neg Hx    Stomach cancer Neg Hx    Colon polyps Neg Hx     Past Surgical History:  Procedure Laterality Date   FINGER  SURGERY Left    As a child. Tip of middle finger cut off in wood shop   TOTAL KNEE ARTHROPLASTY Left 10/26/2023   Procedure: ARTHROPLASTY, KNEE, TOTAL;  Surgeon: Jerri Kay HERO, MD;  Location: [facility] OR;  Service: Orthopedics;  Laterality: Left;   TOTAL KNEE ARTHROPLASTY Right 03/14/2024   Procedure: ARTHROPLASTY, KNEE, TOTAL;  Surgeon: Jerri Kay HERO, MD;  Location: [facility] OR;  Service: Orthopedics;  Laterality: Right;   Social History   Occupational History   Not on file  Tobacco Use   Smoking status: Every Day    Current packs/day: 0.50    Average packs/day: 0.5 packs/day for 13.0 years (6.5 ttl pk-yrs)    Types: Cigarettes    Passive exposure: Current   Smokeless tobacco: Never  Vaping Use    Vaping status: Never Used  Substance and Sexual Activity   Alcohol use: Not Currently    Alcohol/week: 3.0 standard drinks of alcohol    Types: 3 Cans of beer per week    Comment: 1 pint liquer per week   Drug use: No   Sexual activity: Not Currently

## 2024-04-13 ENCOUNTER — Other Ambulatory Visit (HOSPITAL_COMMUNITY): Payer: Self-pay

## 2024-04-13 ENCOUNTER — Emergency Department (HOSPITAL_COMMUNITY)

## 2024-04-13 ENCOUNTER — Encounter (HOSPITAL_COMMUNITY): Payer: Self-pay

## 2024-04-13 ENCOUNTER — Observation Stay (HOSPITAL_COMMUNITY)
Admission: EM | Admit: 2024-04-13 | Discharge: 2024-04-15 | Disposition: A | Discharge status: Home / Self Care | Attending: Orthopaedic Surgery | Admitting: Orthopaedic Surgery

## 2024-04-13 DIAGNOSIS — T8131XA Disruption of external operation (surgical) wound, not elsewhere classified, initial encounter: Secondary | ICD-10-CM | POA: Diagnosis not present

## 2024-04-13 DIAGNOSIS — Z79899 Other long term (current) drug therapy: Secondary | ICD-10-CM | POA: Diagnosis not present

## 2024-04-13 DIAGNOSIS — Z7982 Long term (current) use of aspirin: Secondary | ICD-10-CM | POA: Insufficient documentation

## 2024-04-13 DIAGNOSIS — T8130XA Disruption of wound, unspecified, initial encounter: Principal | ICD-10-CM | POA: Diagnosis present

## 2024-04-13 DIAGNOSIS — Z96651 Presence of right artificial knee joint: Secondary | ICD-10-CM | POA: Diagnosis not present

## 2024-04-13 DIAGNOSIS — R58 Hemorrhage, not elsewhere classified: Secondary | ICD-10-CM | POA: Diagnosis not present

## 2024-04-13 DIAGNOSIS — M25061 Hemarthrosis, right knee: Secondary | ICD-10-CM | POA: Diagnosis present

## 2024-04-13 DIAGNOSIS — Z043 Encounter for examination and observation following other accident: Secondary | ICD-10-CM | POA: Diagnosis not present

## 2024-04-13 DIAGNOSIS — Y792 Prosthetic and other implants, materials and accessory orthopedic devices associated with adverse incidents: Secondary | ICD-10-CM | POA: Diagnosis not present

## 2024-04-13 DIAGNOSIS — I10 Essential (primary) hypertension: Secondary | ICD-10-CM | POA: Diagnosis not present

## 2024-04-13 DIAGNOSIS — F17210 Nicotine dependence, cigarettes, uncomplicated: Secondary | ICD-10-CM | POA: Insufficient documentation

## 2024-04-13 DIAGNOSIS — S80919A Unspecified superficial injury of unspecified knee, initial encounter: Secondary | ICD-10-CM | POA: Diagnosis not present

## 2024-04-13 DIAGNOSIS — W19XXXA Unspecified fall, initial encounter: Secondary | ICD-10-CM | POA: Diagnosis not present

## 2024-04-13 LAB — CBC WITH DIFFERENTIAL/PLATELET
Abs Immature Granulocytes: 0.06 K/uL (ref 0.00–0.07)
Basophils Absolute: 0.1 K/uL (ref 0.0–0.1)
Basophils Relative: 1 %
Eosinophils Absolute: 0.2 K/uL (ref 0.0–0.5)
Eosinophils Relative: 3 %
HCT: 34.5 % — ABNORMAL LOW (ref 39.0–52.0)
Hemoglobin: 11.3 g/dL — ABNORMAL LOW (ref 13.0–17.0)
Immature Granulocytes: 1 %
Lymphocytes Relative: 28 %
Lymphs Abs: 2.3 K/uL (ref 0.7–4.0)
MCH: 32.8 pg (ref 26.0–34.0)
MCHC: 32.8 g/dL (ref 30.0–36.0)
MCV: 100 fL (ref 80.0–100.0)
Monocytes Absolute: 0.7 K/uL (ref 0.1–1.0)
Monocytes Relative: 8 %
Neutro Abs: 4.8 K/uL (ref 1.7–7.7)
Neutrophils Relative %: 59 %
Platelets: 271 K/uL (ref 150–400)
RBC: 3.45 MIL/uL — ABNORMAL LOW (ref 4.22–5.81)
RDW: 14.1 % (ref 11.5–15.5)
WBC: 8 K/uL (ref 4.0–10.5)
nRBC: 0 % (ref 0.0–0.2)

## 2024-04-13 LAB — BASIC METABOLIC PANEL WITH GFR
Anion gap: 13 (ref 5–15)
BUN: 8 mg/dL (ref 6–20)
CO2: 21 mmol/L — ABNORMAL LOW (ref 22–32)
Calcium: 8.7 mg/dL — ABNORMAL LOW (ref 8.9–10.3)
Chloride: 105 mmol/L (ref 98–111)
Creatinine, Ser: 0.7 mg/dL (ref 0.61–1.24)
GFR, Estimated: >60 mL/min (ref >60)
Glucose, Bld: 101 mg/dL — ABNORMAL HIGH (ref 70–99)
Potassium: 3.3 mmol/L — ABNORMAL LOW (ref 3.5–5.1)
Sodium: 139 mmol/L (ref 135–145)

## 2024-04-13 LAB — ETHANOL: Alcohol, Ethyl (B): 227 mg/dL — ABNORMAL HIGH (ref <15)

## 2024-04-13 MED ORDER — ONDANSETRON HCL 4 MG/2ML IJ SOLN: 4.0000 mg | Freq: Four times a day (QID) | INTRAMUSCULAR | Status: DC | PRN

## 2024-04-13 MED ORDER — MESALAMINE 1.2 G PO TBEC
4.8000 g | DELAYED_RELEASE_TABLET | Freq: Every day | ORAL | Status: DC
  Administered 2024-04-14 – 2024-04-15 (×2): 4.8 g via ORAL
  Filled 2024-04-13 (×3): qty 4

## 2024-04-13 MED ORDER — SENNA 8.6 MG PO TABS
1.0000 | ORAL_TABLET | Freq: Two times a day (BID) | ORAL | Status: DC
  Administered 2024-04-13 – 2024-04-14 (×2): 8.6 mg via ORAL
  Filled 2024-04-13 (×4): qty 1

## 2024-04-13 MED ORDER — OXYCODONE HCL 5 MG PO TABS
5.0000 mg | ORAL_TABLET | ORAL | Status: DC | PRN
  Administered 2024-04-14 (×2): 5 mg via ORAL
  Filled 2024-04-13 (×2): qty 1

## 2024-04-13 MED ORDER — CEFAZOLIN SODIUM-DEXTROSE 2-4 GM/100ML-% IV SOLN
2.0000 g | Freq: Three times a day (TID) | INTRAVENOUS | Status: DC
  Administered 2024-04-14 – 2024-04-15 (×4): 2 g via INTRAVENOUS
  Filled 2024-04-13 (×5): qty 100

## 2024-04-13 MED ORDER — VITAMIN D 25 MCG (1000 UNIT) PO TABS
1000.0000 [IU] | ORAL_TABLET | Freq: Every day | ORAL | Status: DC
  Administered 2024-04-14: 1000 [IU] via ORAL
  Filled 2024-04-13: qty 1

## 2024-04-13 MED ORDER — METHOCARBAMOL 750 MG PO TABS
750.0000 mg | ORAL_TABLET | Freq: Three times a day (TID) | ORAL | Status: DC | PRN
  Administered 2024-04-14: 750 mg via ORAL
  Filled 2024-04-13: qty 2

## 2024-04-13 MED ORDER — ACETAMINOPHEN 500 MG PO TABS
1000.0000 mg | ORAL_TABLET | Freq: Three times a day (TID) | ORAL | Status: DC
  Administered 2024-04-13 – 2024-04-15 (×4): 1000 mg via ORAL
  Filled 2024-04-13 (×4): qty 2

## 2024-04-13 MED ORDER — MUPIROCIN 2 % EX OINT
1.0000 | TOPICAL_OINTMENT | Freq: Two times a day (BID) | CUTANEOUS | Status: DC
  Administered 2024-04-14 – 2024-04-15 (×3): 1 via TOPICAL
  Filled 2024-04-13: qty 22

## 2024-04-13 MED ORDER — ATORVASTATIN CALCIUM 40 MG PO TABS
40.0000 mg | ORAL_TABLET | Freq: Every day | ORAL | Status: DC
  Administered 2024-04-14 – 2024-04-15 (×2): 40 mg via ORAL
  Filled 2024-04-13 (×2): qty 1

## 2024-04-13 MED ORDER — POLYETHYLENE GLYCOL 3350 17 G PO PACK
17.0000 g | PACK | Freq: Every day | ORAL | Status: DC
  Administered 2024-04-14: 17 g via ORAL
  Filled 2024-04-13: qty 1

## 2024-04-13 MED ORDER — AMLODIPINE BESYLATE 10 MG PO TABS
10.0000 mg | ORAL_TABLET | Freq: Every day | ORAL | Status: DC
  Administered 2024-04-14 – 2024-04-15 (×2): 10 mg via ORAL
  Filled 2024-04-13: qty 2
  Filled 2024-04-13: qty 1

## 2024-04-13 MED ORDER — GABAPENTIN 400 MG PO CAPS
800.0000 mg | ORAL_CAPSULE | Freq: Two times a day (BID) | ORAL | Status: DC
  Administered 2024-04-13 – 2024-04-14 (×2): 800 mg via ORAL
  Filled 2024-04-13 (×2): qty 8

## 2024-04-13 MED ORDER — ONDANSETRON HCL 4 MG PO TABS: 4.0000 mg | ORAL_TABLET | Freq: Four times a day (QID) | ORAL | Status: DC | PRN

## 2024-04-13 MED ORDER — VITAMIN C 500 MG PO TABS
1000.0000 mg | ORAL_TABLET | Freq: Every day | ORAL | Status: DC
  Administered 2024-04-14 – 2024-04-15 (×2): 1000 mg via ORAL
  Filled 2024-04-13 (×2): qty 2

## 2024-04-13 NOTE — ED Notes (Signed)
 Pt demanding food. ED tech told pt that he would not be able to eat or drink anything until the dr puts in orders. Pt is angry and agitated with ED staff at this time.

## 2024-04-13 NOTE — H&P (Addendum)
 Orthopedic Surgery H&P Note  Assessment: Patient is a 58 y.o. male with right TKA wound dehiscence with suspected joint involvement   Plan: -Will need operative I&D, will discuss with Dr. Jerri in the morning -Diet: N.p.o. at midnight -Hold DVT prophylaxis in anticipation of surgery -Ancef  every 8 hours -Weight bearing status: NWB RLE in KI -PT evaluate and treat postop -Pain control -Spoke with patient about nicotine and the higher risk of wound healing complications with its use.  Encouraged cessation.  Patient not interested in quitting at this time -Dispo: pending completion of operative plans   ___________________________________________________________________________   Chief complaint: Right knee bleeding  History:  Patient is a 58 y.o. male who was in the drinking in the garage when he had 2 falls tonight.  He did not have any pain in the right knee but noted bleeding around the knee so he decided to come to the emergency department for further evaluation.  He had knee replacement surgery with Dr. Jerri 1 month ago on 03/14/2024 on that right knee.  He is still not having any pain in the knee.  His biggest concern right now is getting something to eat.   Last NPO at 930pm tonight  Review of systems: General: denies fevers and chills, myalgias Neurologic: denies recent changes in vision, slurred speech Abdomen: denies nausea, vomiting, hematemesis Respiratory: denies cough, shortness of breath  Past medical history:  HTN HLD IBD Neuropathy Prediabetes  Allergies: meperidine   Past surgical history:  Bilateral TKA  Social history: Reports use of nicotine-containing products (cigarettes, vaping, smokeless, etc.) Alcohol use: yes, was drinking tonight  Denies use of recreational drugs  Family history: -reviewed and not pertinent to wound dehiscence   Physical Exam:  General: no acute distress, appears stated age Neurologic: alert, answering questions  appropriately, following commands Cardiovascular: regular rate, no cyanosis Respiratory: unlabored breathing on room air, symmetric chest rise Psychiatric: appropriate affect, normal cadence to speech  MSK:   -Right lower extremity  Wound dehiscence of the prior midline incision over the knee.  There is exposed patella and the base of the wound.  Some of the sutures for the capsular repair are seen and torn.  There is slow bleeding from the wound.  No gross contamination within the wound. Fires hip flexors, quadriceps, hamstrings, tibialis anterior, gastrocnemius and soleus, extensor hallucis longus Plantarflexes and dorsiflexes toes Sensation intact to light touch in sural, saphenous, tibial, deep peroneal, and superficial peroneal nerve distributions Foot warm and well perfused  Imaging: XRs of the right knee from 04/13/2024 did not show any evidence fracture or dislocation but there is gas seen all the way into the joint space.   Patient name: Justin Burnett Patient MRN: [id number] Date: 04/13/24

## 2024-04-13 NOTE — ED Provider Notes (Signed)
 [facility] EMERGENCY DEPARTMENT AT [facility] Provider Note   CSN: [id number] Arrival date & time: 04/13/24  2209     Patient presents with: Knee Injury   Justin Burnett is a 58 y.o. male.  Patient with past medical history significant for right total knee replacement on September 29 with subsequent issues with wound dehiscence, IBS presents to the emergency department secondary to a fall.  The patient is unsure as to how he fell.  He denies hitting his head or losing consciousness but feels that he may have tripped.  The patient presents with right knee pain with what appears to be complete wound dehiscence down to possibly the layer of the capsule.  He denies other injuries or complaints.   HPI     Prior to Admission medications   Medication Sig Start Date End Date Taking? Authorizing Provider  amLODipine  (NORVASC ) 10 MG tablet Take 1 tablet (10 mg total) by mouth daily. 01/15/24   [name], NP  apixaban  (ELIQUIS ) 2.5 MG TABS tablet Take 1 tablet (2.5 mg total) by mouth 2 (two) times daily for 30 days after surgery to prevent blood clots 03/14/24   Jule Ronal CROME, PA-C  Ascorbic Acid (VITAMIN C) 1000 MG tablet Take 1,000 mg by mouth daily.    [provider]  atorvastatin  (LIPITOR) 40 MG tablet Take 1 tablet (40 mg total) by mouth daily. 01/15/24   [name], NP  Cholecalciferol 25 MCG (1000 UT) capsule Take 1,000 Units by mouth daily.    [provider]  docusate sodium  (COLACE) 100 MG capsule Take 1 capsule (100 mg total) by mouth daily as needed. 03/02/24 03/02/25  Jule Ronal CROME, PA-C  doxycycline (VIBRA-TABS) 100 MG tablet Take 1 tablet (100 mg total) by mouth 2 (two) times daily. 03/16/24   Jule Ronal CROME, PA-C  gabapentin  (NEURONTIN ) 800 MG tablet Take 1 tablet (800 mg total) by mouth 2 (two) times daily. 01/20/24   [name], NP  mesalamine  (LIALDA ) 1.2 g EC tablet Take 4 tablets (4.8 g total) by mouth daily with breakfast. 03/31/24    Abran Norleen SAILOR, MD  methocarbamol  (ROBAXIN ) 750 MG tablet Take 1 tablet (750 mg total) by mouth 3 (three) times daily as needed for muscle spasms. 03/02/24   Jule Ronal CROME, PA-C  mupirocin  ointment (BACTROBAN ) 2 % Apply 1 Application topically 2 (two) times daily. 01/20/24   [name], NP  silver  sulfADIAZINE  (SILVADENE ) 1 % cream Apply 1 Application topically 2 (two) times daily. 03/25/23   [name], NP    Allergies: Meperidine and Meperidine hcl    Review of Systems  Updated Vital Signs BP 131/75 (BP Location: Right Arm)   Pulse 74   Temp 97.6 F (36.4 C) (Oral)   Resp 16   SpO2 100%   Physical Exam Vitals and nursing note reviewed.  Constitutional:      General: He is not in acute distress.    Appearance: He is well-developed.  HENT:     Head: Normocephalic and atraumatic.  Eyes:     Conjunctiva/sclera: Conjunctivae normal.  Cardiovascular:     Rate and Rhythm: Normal rate and regular rhythm.     Heart sounds: No murmur heard. Pulmonary:     Effort: Pulmonary effort is normal. No respiratory distress.     Breath sounds: Normal breath sounds.  Abdominal:     Palpations: Abdomen is soft.     Tenderness: There is no abdominal tenderness.  Musculoskeletal:        General: No swelling.     Cervical back: Neck supple.     Comments: See media image of wound dehiscence of right knee  Skin:    General: Skin is warm and dry.     Capillary Refill: Capillary refill takes less than 2 seconds.  Neurological:     Mental Status: He is alert.  Psychiatric:        Mood and Affect: Mood normal.     (all labs ordered are listed, but only abnormal results are displayed) Labs Reviewed  BASIC METABOLIC PANEL WITH GFR - Abnormal; Notable for the following components:      Result Value   Potassium 3.3 (*)    CO2 21 (*)    Glucose, Bld 101 (*)    Calcium  8.7 (*)    All other components within normal limits  CBC WITH DIFFERENTIAL/PLATELET - Abnormal; Notable for the  following components:   RBC 3.45 (*)    Hemoglobin 11.3 (*)    HCT 34.5 (*)    All other components within normal limits  ETHANOL - Abnormal; Notable for the following components:   Alcohol, Ethyl (B) 227 (*)    All other components within normal limits  HIV ANTIBODY (ROUTINE TESTING W REFLEX)  TYPE AND SCREEN    EKG: None  Radiology: DG Knee 1-2 Views Right Result Date: 04/13/2024 CLINICAL DATA:  Fall EXAM: RIGHT KNEE - 1-2 VIEW COMPARISON:  03/15/2024, 03/14/2024 FINDINGS: Right knee replacement with intact hardware and normal alignment. No definitive displaced fracture is seen. Moderate gas within the soft tissues and joint space. IMPRESSION: 1. Right knee replacement without acute fracture 2. Moderate gas within the soft tissues and joint space which could be due to a laceration or open wound, correlate with the physical exam Electronically Signed   By: Luke Bun M.D.   On: 04/13/2024 22:36     Procedures   Medications Ordered in the ED  amLODipine  (NORVASC ) tablet 10 mg (has no administration in time range)  atorvastatin  (LIPITOR) tablet 40 mg (has no administration in time range)  mesalamine  (LIALDA ) EC tablet 4.8 g (has no administration in time range)  gabapentin  (NEURONTIN ) capsule 800 mg (has no administration in time range)  methocarbamol  (ROBAXIN ) tablet 750 mg (has no administration in time range)  ascorbic acid (VITAMIN C) tablet 1,000 mg (has no administration in time range)  cholecalciferol (VITAMIN D3) 25 MCG (1000 UNIT) tablet 1,000 Units (has no administration in time range)  mupirocin  ointment (BACTROBAN ) 2 % 1 Application (has no administration in time range)  acetaminophen  (TYLENOL ) tablet 1,000 mg (has no administration in time range)  oxyCODONE  (Oxy IR/ROXICODONE ) immediate release tablet 5 mg (has no administration in time range)  senna (SENOKOT) tablet 8.6 mg (has no administration in time range)  polyethylene glycol (MIRALAX / GLYCOLAX) packet 17 g  (has no administration in time range)  ondansetron  (ZOFRAN ) tablet 4 mg (has no administration in time range)    Or  ondansetron  (ZOFRAN ) injection 4 mg (has no administration in time range)  ceFAZolin  (ANCEF ) IVPB 2g/100 mL premix (has no administration in time range)                                    Medical Decision Making Amount and/or Complexity of Data Reviewed Radiology: ordered.   This patient presents to the ED for concern of knee injury,  this involves an extensive number of treatment options, and is a complaint that carries with it a high risk of complications and morbidity.  The differential diagnosis includes fracture, dislocation, soft tissue injury including wound dehiscence, others   Co morbidities / Chronic conditions that complicate the patient evaluation  As in HPI   Additional history obtained:  Additional history obtained from EMR External records from outside source obtained and reviewed including orthopedic notes   Lab Tests:  I Ordered, and personally interpreted labs.  The pertinent results include: Ethanol 227  Imaging Studies ordered:  I ordered imaging studies including plain films of the right knee I independently visualized and interpreted imaging which showed  1. Right knee replacement without acute fracture  2. Moderate gas within the soft tissues and joint space which could  be due to a laceration or open wound, correlate with the physical  exam   I agree with the radiologist interpretation    Consultations Obtained:  I requested consultation with the orthopedic surgeon, Dr. Georgina,  and discussed lab and imaging findings as well as pertinent plan - they recommend: Admission for likely surgical washout   Social Determinants of Health:  Patient has Medicaid for his primary health insurance type, has history of being a daily tobacco smoker   Test / Admission - Considered:  Patient with wound dehiscence of the right knee.  X-ray  showing gas in the joint.  Patient will need surgical washout and closure.  Admit to orthopedic surgery      Final diagnoses:  Wound dehiscence    ED Discharge Orders     None          Logan Ubaldo KATHEE DEVONNA 04/13/24 2338    Ruthe Cornet, DO 04/14/24 1220

## 2024-04-13 NOTE — ED Triage Notes (Addendum)
 Pt comes via [facility], had recently knee surgery about a month ago, tonight fell onto his knee 3 times and popped all the stiches out of it. Full wound dehiscence, PTA given 2gm ancef ,  ETOH on board

## 2024-04-14 ENCOUNTER — Encounter (HOSPITAL_COMMUNITY)
Admission: EM | Disposition: A | Discharge status: Home / Self Care | Payer: Self-pay | Source: Home / Self Care | Attending: Orthopaedic Surgery

## 2024-04-14 ENCOUNTER — Other Ambulatory Visit (HOSPITAL_COMMUNITY): Payer: Self-pay

## 2024-04-14 ENCOUNTER — Observation Stay (HOSPITAL_COMMUNITY): Admitting: Anesthesiology

## 2024-04-14 ENCOUNTER — Other Ambulatory Visit: Payer: Self-pay

## 2024-04-14 ENCOUNTER — Other Ambulatory Visit: Payer: Self-pay | Admitting: Physician Assistant

## 2024-04-14 ENCOUNTER — Telehealth: Payer: Self-pay

## 2024-04-14 ENCOUNTER — Encounter (HOSPITAL_COMMUNITY): Payer: Self-pay | Admitting: Orthopaedic Surgery

## 2024-04-14 DIAGNOSIS — I10 Essential (primary) hypertension: Secondary | ICD-10-CM | POA: Diagnosis not present

## 2024-04-14 DIAGNOSIS — T8130XA Disruption of wound, unspecified, initial encounter: Secondary | ICD-10-CM

## 2024-04-14 DIAGNOSIS — E785 Hyperlipidemia, unspecified: Secondary | ICD-10-CM

## 2024-04-14 DIAGNOSIS — Z96651 Presence of right artificial knee joint: Secondary | ICD-10-CM

## 2024-04-14 DIAGNOSIS — T84092A Other mechanical complication of internal right knee prosthesis, initial encounter: Secondary | ICD-10-CM | POA: Diagnosis not present

## 2024-04-14 DIAGNOSIS — W1830XA Fall on same level, unspecified, initial encounter: Secondary | ICD-10-CM

## 2024-04-14 DIAGNOSIS — F17210 Nicotine dependence, cigarettes, uncomplicated: Secondary | ICD-10-CM

## 2024-04-14 DIAGNOSIS — T8131XA Disruption of external operation (surgical) wound, not elsewhere classified, initial encounter: Secondary | ICD-10-CM | POA: Diagnosis not present

## 2024-04-14 HISTORY — PX: I & D KNEE WITH POLY EXCHANGE: SHX5024

## 2024-04-14 HISTORY — PX: APPLICATION OF WOUND VAC: SHX5189

## 2024-04-14 LAB — CBC
HCT: 38.5 % — ABNORMAL LOW (ref 39.0–52.0)
Hemoglobin: 12.6 g/dL — ABNORMAL LOW (ref 13.0–17.0)
MCH: 32.6 pg (ref 26.0–34.0)
MCHC: 32.7 g/dL (ref 30.0–36.0)
MCV: 99.7 fL (ref 80.0–100.0)
Platelets: 280 K/uL (ref 150–400)
RBC: 3.86 MIL/uL — ABNORMAL LOW (ref 4.22–5.81)
RDW: 14.1 % (ref 11.5–15.5)
WBC: 9.7 K/uL (ref 4.0–10.5)
nRBC: 0 % (ref 0.0–0.2)

## 2024-04-14 LAB — ABO/RH: ABO/RH(D): O POS

## 2024-04-14 LAB — TYPE AND SCREEN
ABO/RH(D): O POS
Antibody Screen: NEGATIVE

## 2024-04-14 LAB — HIV ANTIBODY (ROUTINE TESTING W REFLEX): HIV Screen 4th Generation wRfx: NONREACTIVE

## 2024-04-14 SURGERY — IRRIGATION AND DEBRIDEMENT KNEE WITH POLY EXCHANGE
Anesthesia: General | Site: Knee | Laterality: Right

## 2024-04-14 MED ORDER — MIDAZOLAM HCL (PF) 2 MG/2ML IJ SOLN
INTRAMUSCULAR | Status: DC | PRN
  Administered 2024-04-14 (×2): 1 mg via INTRAVENOUS

## 2024-04-14 MED ORDER — DOXYCYCLINE HYCLATE 100 MG PO TABS
100.0000 mg | ORAL_TABLET | Freq: Two times a day (BID) | ORAL | 0 refills | Status: AC
  Filled 2024-04-14: qty 56, 28d supply, fill #0

## 2024-04-14 MED ORDER — SODIUM CHLORIDE 0.9 % IV SOLN
INTRAVENOUS | Status: DC
Start: 2024-04-14 — End: 2024-04-15

## 2024-04-14 MED ORDER — ACETAMINOPHEN 325 MG PO TABS: 325.0000 mg | ORAL_TABLET | Freq: Four times a day (QID) | ORAL | Status: DC | PRN

## 2024-04-14 MED ORDER — HYDROMORPHONE HCL 1 MG/ML IJ SOLN
1.0000 mg | Freq: Three times a day (TID) | INTRAMUSCULAR | Status: DC | PRN
  Administered 2024-04-14: 1 mg via INTRAVENOUS
  Filled 2024-04-14: qty 1

## 2024-04-14 MED ORDER — ACETAMINOPHEN 10 MG/ML IV SOLN
INTRAVENOUS | Status: AC
  Filled 2024-04-14: qty 100

## 2024-04-14 MED ORDER — CEFAZOLIN SODIUM-DEXTROSE 2-4 GM/100ML-% IV SOLN
2.0000 g | INTRAVENOUS | Status: AC
  Administered 2024-04-14: 2 g via INTRAVENOUS

## 2024-04-14 MED ORDER — MIDAZOLAM HCL 2 MG/2ML IJ SOLN
INTRAMUSCULAR | Status: AC
  Filled 2024-04-14: qty 2

## 2024-04-14 MED ORDER — TRANEXAMIC ACID-NACL 1000-0.7 MG/100ML-% IV SOLN: 1000.0000 mg | INTRAVENOUS | Status: DC

## 2024-04-14 MED ORDER — OXYCODONE HCL 5 MG PO TABS
10.0000 mg | ORAL_TABLET | Freq: Four times a day (QID) | ORAL | Status: DC | PRN
  Administered 2024-04-14 – 2024-04-15 (×2): 10 mg via ORAL
  Filled 2024-04-14 (×2): qty 2

## 2024-04-14 MED ORDER — METHOCARBAMOL 1000 MG/10ML IJ SOLN
500.0000 mg | Freq: Four times a day (QID) | INTRAMUSCULAR | Status: DC | PRN
Start: 2024-04-14 — End: 2024-04-15

## 2024-04-14 MED ORDER — VANCOMYCIN HCL 1000 MG IV SOLR
INTRAVENOUS | Status: AC
  Filled 2024-04-14: qty 20

## 2024-04-14 MED ORDER — DOXYCYCLINE HYCLATE 100 MG PO TABS
100.0000 mg | ORAL_TABLET | Freq: Two times a day (BID) | ORAL | Status: DC
  Administered 2024-04-14 – 2024-04-15 (×2): 100 mg via ORAL
  Filled 2024-04-14 (×2): qty 1

## 2024-04-14 MED ORDER — FENTANYL CITRATE (PF) 100 MCG/2ML IJ SOLN
25.0000 ug | INTRAMUSCULAR | Status: DC | PRN
  Administered 2024-04-14 (×2): 50 ug via INTRAVENOUS

## 2024-04-14 MED ORDER — KETOROLAC TROMETHAMINE 15 MG/ML IJ SOLN
7.5000 mg | Freq: Four times a day (QID) | INTRAMUSCULAR | Status: DC
  Administered 2024-04-14 – 2024-04-15 (×3): 7.5 mg via INTRAVENOUS
  Filled 2024-04-14 (×3): qty 1

## 2024-04-14 MED ORDER — TRANEXAMIC ACID-NACL 1000-0.7 MG/100ML-% IV SOLN
INTRAVENOUS | Status: AC
  Filled 2024-04-14: qty 100

## 2024-04-14 MED ORDER — CEFAZOLIN SODIUM-DEXTROSE 2-4 GM/100ML-% IV SOLN: 2.0000 g | Freq: Four times a day (QID) | INTRAVENOUS | Status: DC

## 2024-04-14 MED ORDER — ACETAMINOPHEN 500 MG PO TABS: 1000.0000 mg | ORAL_TABLET | Freq: Four times a day (QID) | ORAL | Status: DC

## 2024-04-14 MED ORDER — ASPIRIN 81 MG PO CHEW
81.0000 mg | CHEWABLE_TABLET | Freq: Two times a day (BID) | ORAL | 0 refills | Status: AC
  Filled 2024-04-14: qty 28, 14d supply, fill #0

## 2024-04-14 MED ORDER — ORAL CARE MOUTH RINSE: 15.0000 mL | Freq: Once | OROMUCOSAL | Status: AC

## 2024-04-14 MED ORDER — POVIDONE-IODINE 10 % EX SWAB: 2.0000 | Freq: Once | CUTANEOUS | Status: DC

## 2024-04-14 MED ORDER — PROPOFOL 10 MG/ML IV BOLUS
INTRAVENOUS | Status: DC | PRN
  Administered 2024-04-14: 180 mg via INTRAVENOUS
  Administered 2024-04-14: 20 mg via INTRAVENOUS

## 2024-04-14 MED ORDER — METOCLOPRAMIDE HCL 5 MG/ML IJ SOLN: 5.0000 mg | Freq: Three times a day (TID) | INTRAMUSCULAR | Status: DC | PRN

## 2024-04-14 MED ORDER — PHENOL 1.4 % MT LIQD: 1.0000 | OROMUCOSAL | Status: DC | PRN

## 2024-04-14 MED ORDER — ACETAMINOPHEN 10 MG/ML IV SOLN
1000.0000 mg | Freq: Once | INTRAVENOUS | Status: DC | PRN
  Administered 2024-04-14: 1000 mg via INTRAVENOUS

## 2024-04-14 MED ORDER — TRANEXAMIC ACID-NACL 1000-0.7 MG/100ML-% IV SOLN
1000.0000 mg | INTRAVENOUS | Status: AC
  Administered 2024-04-14: 1000 mg via INTRAVENOUS

## 2024-04-14 MED ORDER — SODIUM CHLORIDE 0.9 % IR SOLN
Status: DC | PRN
  Administered 2024-04-14 (×2): 3000 mL

## 2024-04-14 MED ORDER — 0.9 % SODIUM CHLORIDE (POUR BTL) OPTIME
TOPICAL | Status: DC | PRN
Start: 2024-04-14 — End: 2024-04-14
  Administered 2024-04-14: 1000 mL

## 2024-04-14 MED ORDER — CHLORHEXIDINE GLUCONATE 0.12 % MT SOLN: 15.0000 mL | Freq: Once | OROMUCOSAL | Status: AC

## 2024-04-14 MED ORDER — FENTANYL CITRATE (PF) 250 MCG/5ML IJ SOLN
INTRAMUSCULAR | Status: AC
  Filled 2024-04-14: qty 5

## 2024-04-14 MED ORDER — ONDANSETRON HCL 4 MG/2ML IJ SOLN
INTRAMUSCULAR | Status: AC
  Filled 2024-04-14: qty 2

## 2024-04-14 MED ORDER — TRANEXAMIC ACID 1000 MG/10ML IV SOLN
2000.0000 mg | INTRAVENOUS | Status: DC
  Filled 2024-04-14 (×2): qty 20

## 2024-04-14 MED ORDER — VANCOMYCIN HCL 1 G IV SOLR
INTRAVENOUS | Status: DC | PRN
  Administered 2024-04-14: 1000 mg via TOPICAL

## 2024-04-14 MED ORDER — PHENYLEPHRINE 80 MCG/ML (10ML) SYRINGE FOR IV PUSH (FOR BLOOD PRESSURE SUPPORT)
PREFILLED_SYRINGE | INTRAVENOUS | Status: DC | PRN
  Administered 2024-04-14 (×3): 80 ug via INTRAVENOUS

## 2024-04-14 MED ORDER — DEXAMETHASONE SOD PHOSPHATE PF 10 MG/ML IJ SOLN
10.0000 mg | Freq: Once | INTRAMUSCULAR | Status: AC
  Administered 2024-04-15: 10 mg via INTRAVENOUS

## 2024-04-14 MED ORDER — TRANEXAMIC ACID 1000 MG/10ML IV SOLN: 2000.0000 mg | INTRAVENOUS | Status: DC

## 2024-04-14 MED ORDER — LIDOCAINE 2% (20 MG/ML) 5 ML SYRINGE
INTRAMUSCULAR | Status: AC
  Filled 2024-04-14: qty 5

## 2024-04-14 MED ORDER — LIDOCAINE HCL (CARDIAC) PF 100 MG/5ML IV SOSY
PREFILLED_SYRINGE | INTRAVENOUS | Status: DC | PRN
Start: 2024-04-14 — End: 2024-04-14
  Administered 2024-04-14: 80 mg via INTRAVENOUS

## 2024-04-14 MED ORDER — TRANEXAMIC ACID-NACL 1000-0.7 MG/100ML-% IV SOLN
1000.0000 mg | Freq: Once | INTRAVENOUS | Status: AC
  Administered 2024-04-14: 1000 mg via INTRAVENOUS
  Filled 2024-04-14: qty 100

## 2024-04-14 MED ORDER — ONDANSETRON HCL 4 MG/2ML IJ SOLN: 4.0000 mg | Freq: Once | INTRAMUSCULAR | Status: DC | PRN

## 2024-04-14 MED ORDER — TRANEXAMIC ACID 1000 MG/10ML IV SOLN
INTRAVENOUS | Status: DC | PRN
  Administered 2024-04-14: 2000 mg via TOPICAL

## 2024-04-14 MED ORDER — DEXAMETHASONE SOD PHOSPHATE PF 10 MG/ML IJ SOLN
INTRAMUSCULAR | Status: DC | PRN
  Administered 2024-04-14: 10 mg via INTRAVENOUS

## 2024-04-14 MED ORDER — ONDANSETRON HCL 4 MG PO TABS: 4.0000 mg | ORAL_TABLET | Freq: Four times a day (QID) | ORAL | Status: DC | PRN

## 2024-04-14 MED ORDER — PHENYLEPHRINE 80 MCG/ML (10ML) SYRINGE FOR IV PUSH (FOR BLOOD PRESSURE SUPPORT)
PREFILLED_SYRINGE | INTRAVENOUS | Status: AC
  Filled 2024-04-14: qty 10

## 2024-04-14 MED ORDER — PHENYLEPHRINE HCL (PRESSORS) 10 MG/ML IV SOLN
INTRAVENOUS | Status: AC
  Filled 2024-04-14: qty 1

## 2024-04-14 MED ORDER — METHOCARBAMOL 500 MG PO TABS: 500.0000 mg | ORAL_TABLET | Freq: Four times a day (QID) | ORAL | Status: DC | PRN

## 2024-04-14 MED ORDER — PHENYLEPHRINE HCL-NACL 20-0.9 MG/250ML-% IV SOLN
INTRAVENOUS | Status: DC | PRN
  Administered 2024-04-14: 40 ug/min via INTRAVENOUS

## 2024-04-14 MED ORDER — ONDANSETRON HCL 4 MG/2ML IJ SOLN: 4.0000 mg | Freq: Four times a day (QID) | INTRAMUSCULAR | Status: DC | PRN

## 2024-04-14 MED ORDER — CHLORHEXIDINE GLUCONATE 0.12 % MT SOLN
OROMUCOSAL | Status: AC
  Administered 2024-04-14: 15 mL via OROMUCOSAL
  Filled 2024-04-14: qty 15

## 2024-04-14 MED ORDER — DOCUSATE SODIUM 100 MG PO CAPS
100.0000 mg | ORAL_CAPSULE | Freq: Two times a day (BID) | ORAL | Status: DC
  Administered 2024-04-15: 100 mg via ORAL
  Filled 2024-04-14 (×2): qty 1

## 2024-04-14 MED ORDER — MENTHOL 3 MG MT LOZG: 1.0000 | LOZENGE | OROMUCOSAL | Status: DC | PRN

## 2024-04-14 MED ORDER — OXYCODONE HCL 5 MG/5ML PO SOLN: 5.0000 mg | Freq: Once | ORAL | Status: DC | PRN

## 2024-04-14 MED ORDER — LACTATED RINGERS IV SOLN: INTRAVENOUS | Status: DC

## 2024-04-14 MED ORDER — OXYCODONE HCL 5 MG PO TABS: 5.0000 mg | ORAL_TABLET | Freq: Four times a day (QID) | ORAL | Status: DC | PRN

## 2024-04-14 MED ORDER — PROPOFOL 10 MG/ML IV BOLUS
INTRAVENOUS | Status: AC
  Filled 2024-04-14: qty 20

## 2024-04-14 MED ORDER — ONDANSETRON HCL 4 MG/2ML IJ SOLN
INTRAMUSCULAR | Status: DC | PRN
  Administered 2024-04-14: 4 mg via INTRAVENOUS

## 2024-04-14 MED ORDER — OXYCODONE HCL 5 MG PO TABS: 5.0000 mg | ORAL_TABLET | Freq: Once | ORAL | Status: DC | PRN

## 2024-04-14 MED ORDER — POVIDONE-IODINE 10 % EX SWAB
2.0000 | Freq: Once | CUTANEOUS | Status: AC
  Administered 2024-04-14: 2 via TOPICAL

## 2024-04-14 MED ORDER — CEFAZOLIN SODIUM-DEXTROSE 2-4 GM/100ML-% IV SOLN: 2.0000 g | INTRAVENOUS | Status: DC

## 2024-04-14 MED ORDER — PROPOFOL 10 MG/ML IV BOLUS
INTRAVENOUS | Status: AC
Start: 2024-04-14 — End: 2024-04-14
  Filled 2024-04-14: qty 20

## 2024-04-14 MED ORDER — FENTANYL CITRATE (PF) 250 MCG/5ML IJ SOLN
INTRAMUSCULAR | Status: DC | PRN
  Administered 2024-04-14 (×5): 50 ug via INTRAVENOUS

## 2024-04-14 MED ORDER — METOCLOPRAMIDE HCL 5 MG PO TABS: 5.0000 mg | ORAL_TABLET | Freq: Three times a day (TID) | ORAL | Status: DC | PRN

## 2024-04-14 MED ORDER — FENTANYL CITRATE (PF) 100 MCG/2ML IJ SOLN
INTRAMUSCULAR | Status: AC
  Filled 2024-04-14: qty 2

## 2024-04-14 MED ORDER — POTASSIUM CHLORIDE 2 MEQ/ML IV SOLN
INTRAVENOUS | Status: AC
  Filled 2024-04-14: qty 1000

## 2024-04-14 SURGICAL SUPPLY — 46 items
BAG COUNTER SPONGE SURGICOUNT (BAG) ×1 IMPLANT
BNDG COHESIVE 6X5 TAN ST LF (GAUZE/BANDAGES/DRESSINGS) ×1 IMPLANT
CANISTER WOUND CARE 500ML ATS (WOUND CARE) IMPLANT
COVER SURGICAL LIGHT HANDLE (MISCELLANEOUS) ×1 IMPLANT
CUFF TOURN SGL QUICK 42 (TOURNIQUET CUFF) ×1 IMPLANT
CUFF TRNQT CYL 34X4.125X (TOURNIQUET CUFF) IMPLANT
DRAPE IMP U-DRAPE 54X76 (DRAPES) ×1 IMPLANT
DRAPE SURG ORHT 6 SPLT 77X108 (DRAPES) ×1 IMPLANT
DRESSING PEEL AND PLC PRVNA 13 (GAUZE/BANDAGES/DRESSINGS) IMPLANT
ELECT CAUTERY BLADE 6.4 (BLADE) ×2 IMPLANT
ELECTRODE REM PT RTRN 9FT ADLT (ELECTROSURGICAL) ×1 IMPLANT
FACESHIELD WRAPAROUND OR TEAM (MASK) IMPLANT
GLOVE BIOGEL PI IND STRL 7.5 (GLOVE) ×1 IMPLANT
GLOVE ECLIPSE 7.0 STRL STRAW (GLOVE) ×1 IMPLANT
GLOVE INDICATOR 7.0 STRL GRN (GLOVE) ×1 IMPLANT
GLOVE SURG LATEX 7.5 PF (GLOVE) IMPLANT
GLOVE SURG SYN 7.5 PF PI (GLOVE) ×1 IMPLANT
GLOVE SURG UNDER POLY LF SZ7.5 (GLOVE) ×8 IMPLANT
GOWN STRL SURGICAL XL XLNG (GOWN DISPOSABLE) ×2 IMPLANT
IMMOBILIZER KNEE 24 THIGH 36 (SOFTGOODS) IMPLANT
IMPL TAPESTRY BIOINTEGR 40X30 (Mesh General) IMPLANT
IV 0.9% NACL 1000 ML (IV SOLUTION) ×1 IMPLANT
KIT BASIN OR (CUSTOM PROCEDURE TRAY) ×1 IMPLANT
KIT TURNOVER KIT B (KITS) ×1 IMPLANT
LINER TIB PS RM GH/12 16 RT (Liner) IMPLANT
MANIFOLD NEPTUNE II (INSTRUMENTS) ×1 IMPLANT
PACK ORTHO EXTREMITY (CUSTOM PROCEDURE TRAY) ×1 IMPLANT
PACK UNIVERSAL I (CUSTOM PROCEDURE TRAY) ×1 IMPLANT
PAD ARMBOARD POSITIONER FOAM (MISCELLANEOUS) ×2 IMPLANT
SET HNDPC FAN SPRY TIP SCT (DISPOSABLE) ×1 IMPLANT
SOLN 0.9% NACL POUR BTL 1000ML (IV SOLUTION) ×1 IMPLANT
SOLN STERILE WATER BTL 1000 ML (IV SOLUTION) ×1 IMPLANT
SOLUTION IRRIG SURGIPHOR (IV SOLUTION) IMPLANT
SPONGE T-LAP 18X18 ~~LOC~~+RFID (SPONGE) ×1 IMPLANT
STOCKINETTE IMPERVIOUS 9X36 MD (GAUZE/BANDAGES/DRESSINGS) ×1 IMPLANT
SUT ETHILON 2 0 FS 18 (SUTURE) ×1 IMPLANT
SUT MON AB 2-0 CT1 36 (SUTURE) ×1 IMPLANT
SUT VIC AB 1 CTX 27 (SUTURE) IMPLANT
SUT VIC AB 2-0 CT1 TAPERPNT 27 (SUTURE) IMPLANT
SWAB CULTURE ESWAB REG 1ML (MISCELLANEOUS) ×1 IMPLANT
SYR 30ML SLIP (SYRINGE) IMPLANT
TOWEL GREEN STERILE (TOWEL DISPOSABLE) ×1 IMPLANT
TOWEL GREEN STERILE FF (TOWEL DISPOSABLE) ×1 IMPLANT
TUBE CONNECTING 12X1/4 (SUCTIONS) ×1 IMPLANT
UNDERPAD 30X36 HEAVY ABSORB (UNDERPADS AND DIAPERS) ×1 IMPLANT
YANKAUER SUCT BULB TIP NO VENT (SUCTIONS) ×1 IMPLANT

## 2024-04-14 NOTE — Telephone Encounter (Signed)
 [name] with [facility] Utilization Review wanted to know if patient can be switched to Inpatient.  CB# [phone].  Please advise.  Thank you.

## 2024-04-14 NOTE — Telephone Encounter (Signed)
 yes

## 2024-04-14 NOTE — Anesthesia Preprocedure Evaluation (Addendum)
 Anesthesia Evaluation  Patient identified by MRN, date of birth, ID band Patient awake    Reviewed: Allergy & Precautions, NPO status , Patient's Chart, lab work & pertinent test results, reviewed documented beta blocker date and time   History of Anesthesia Complications Negative for: history of anesthetic complications  Airway Mallampati: I  TM Distance: >3 FB     Dental  (+) Loose, Missing,    Pulmonary neg shortness of breath, neg COPD, Current Smoker and Patient abstained from smoking.   breath sounds clear to auscultation       Cardiovascular hypertension, (-) CAD, (-) Past MI and (-) Cardiac Stents + Valvular Problems/Murmurs  Rhythm:Regular Rate:Normal  IMPRESSIONS     1. Left ventricular ejection fraction, by estimation, is 60 to 65%. The  left ventricle has normal function. The left ventricle has no regional  wall motion abnormalities. Left ventricular diastolic parameters are  consistent with Grade I diastolic  dysfunction (impaired relaxation).   2. Right ventricular systolic function is normal. The right ventricular  size is normal.   3. The mitral valve is normal in structure. No evidence of mitral valve  regurgitation. No evidence of mitral stenosis.   4. The aortic valve is normal in structure. Aortic valve regurgitation is  not visualized. No aortic stenosis is present.   5. The inferior vena cava is normal in size with greater than 50%  respiratory variability, suggesting right atrial pressure of 3 mmHg.      Neuro/Psych neg Seizures  Neuromuscular disease    GI/Hepatic ,neg GERD  ,,(+) neg Cirrhosis    substance abuse  alcohol use  Endo/Other    Renal/GU Renal disease     Musculoskeletal  (+) Arthritis ,    Abdominal   Peds  Hematology  (+) Blood dyscrasia, anemia   Anesthesia Other Findings   Reproductive/Obstetrics                              Anesthesia  Physical Anesthesia Plan  ASA: 3  Anesthesia Plan: General   Post-op Pain Management:    Induction: Intravenous  PONV Risk Score and Plan: 2 and Ondansetron  and Dexamethasone   Airway Management Planned: LMA  Additional Equipment:   Intra-op Plan:   Post-operative Plan: Extubation in OR  Informed Consent: I have reviewed the patients History and Physical, chart, labs and discussed the procedure including the risks, benefits and alternatives for the proposed anesthesia with the patient or authorized representative who has indicated his/her understanding and acceptance.     Dental advisory given  Plan Discussed with: CRNA  Anesthesia Plan Comments:          Anesthesia Quick Evaluation

## 2024-04-14 NOTE — Discharge Instructions (Signed)
 Do not remove incisional vac.  Do not get this wet. WBAT operative extremity.   Wear knee immobilizer at all times and do not bend the knee whatsoever.   Take antibiotics x 4 weeks after surgery Take aspirin as directed to prevent blood clots

## 2024-04-14 NOTE — Progress Notes (Signed)
 Orthopedic Tech Progress Note Patient Details:  Justin Burnett 1966/04/04 [id number]  Ortho Devices Type of Ortho Device: Bone foam zero knee Ortho Device/Splint Location: RLE Ortho Device/Splint Interventions: Ordered, Application, Adjustment   Post Interventions Patient Tolerated: Fair Instructions Provided: Care of device  Thersia FALCON Tashawn Laswell 04/14/2024, 7:12 PM

## 2024-04-14 NOTE — ED Notes (Addendum)
 Report given to [facility] of preop.

## 2024-04-14 NOTE — Progress Notes (Signed)
 Transition of Care [facility]) - CAGE-AID Screening   Patient Details  Name: Justin Burnett MRN: [id number] Date of Birth: 19-Feb-1966  Transition of Care [facility]) CM/SW Contact:    Bernardino Mayotte, RN Phone Number: 04/14/2024, 9:05 PM   Clinical Narrative:  Patient reports some alcohol use, denies use of illicit substances. Resources not given at this time.  CAGE-AID Screening:    Have You Ever Felt You Ought to Cut Down on Your Drinking or Drug Use?: No Have People Annoyed You By Critizing Your Drinking Or Drug Use?: No Have You Felt Bad Or Guilty About Your Drinking Or Drug Use?: No Have You Ever Had a Drink or Used Drugs First Thing In The Morning to Steady Your Nerves or to Get Rid of a Hangover?: No CAGE-AID Score: 0  Substance Abuse Education Offered: No

## 2024-04-14 NOTE — Op Note (Signed)
 Preoperative diagnosis: Wound dehiscence right knee  Postoperative diagnosis:same  Operative findings: Traumatic wound dehiscence and traumatic arthrotomy  Operative procedure:  Irrigation and excisional debridement right knee wound including skin, subcutaneous tissue, fascia 200 cm Exchange of polyethylene liner Secondary closure of surgical wound dehiscence Application of incisional VAC  Surgeon: N. Ozell Cummins, MD  Assist: Ronal Morna Grave, PA-C; necessary for the timely completion of procedure and due to complexity of procedure.  Anesthesia: General  Tourniquet time: 1 hour  Implants used: Zimmer Polyethylene: Explanted 16 mm medial congruent; implanted 16 mm medial congruent  Indication: Justin Burnett is a 58 y.o. year old male who was approximately a month status post right total knee arthroplasty who had a mechanical fall last night at home due to alcohol intoxication and suffered a traumatic wound dehiscence and traumatic arthrotomy.  We have reviewed the risk and benefits of the surgery and they elected to proceed after voicing understanding.  Procedure:  After informed consent was obtained and understanding of the risk were voiced including but not limited to bleeding, infection, damage to surrounding structures including nerves and vessels, blood clots, leg length inequality and the failure to achieve desired results, the operative extremity was marked with verbal confirmation of the patient in the holding area.   The previous sutures were removed from the subcuticular and subcutaneous layers.  Traumatic arthrotomy was seen.  Exposed components were in direct view.  There was no injury to the components.  Traumatic hematoma was evacuated.  The arthrotomy was opened up completely proximally and distally.  Medial peel was performed to improve exposure.  Circumferential synovectomy was performed.  The knee was then flexed and the polyethylene liner was removed with 1/4  inch osteotome without any damage to the baseplate.  The femoral and tibial components were ingrown and not loose.  There was no evidence of infection or gross contamination.  The knee was then extended and a sharp excisional debridement was carried out with a knife and rondure including the skin, subcutaneous tissue, muscle and fascia.  The medial retinaculum was adhered to the subcutaneous tissue which appeared to have been previously injured from a prior fall.  I used a Bovie to develop a tissue plane between the retinaculum and the subcutaneous tissue to aid in the closure of the arthrotomy.  We then trialed another 16 mm poly which allow full extension and greater than 120 degrees of flexion with gravity.  Collaterals are stable.  The trial poly was removed.  After thorough debridement 3 L normal saline was irrigated throughout the knee.  A liter of dilute Betadine  solution was irrigated through the knee and allowed to sit for 3 minutes which was then irrigated out with a liter of normal saline.  New drapes were then placed and everyone changed her gloves.  The knee was flexed and retractors were placed.  A new 16 mm medial congruent poly was inserted and locked into the baseplate.  Vancomycin  powder was placed in the joint.  The joint was closed with the knee in full extension using interrupted #1 Vicryl.  He had 2 areas in the arthrotomy closure that was tenuous so I supplemented these areas with tapestry bio integrative collagen implant which were sutured to the tissue with #1 Vicryl.  The rest of the surgical site was closed in the usual layered fashion using 0 Vicryl, 2-0 Monocryl, 2-0 nylon.  Incisional VAC was placed on top.  Knee immobilizer was placed.  The patient was awakened  in the operating room and taken to recovery in stable condition. All sponge, needle, and instrument counts were correct at the end of the case.  Morna Grave was necessary for opening, closing, retracting, limb positioning  and overall facilitation and completion of the surgery.  Position: supine  Complications: none.  Time Out: performed  Drains/Packing: none Estimated blood loss: minimal Returned to Recovery Room: in good condition.   Mechanical VTE (DVT) Prophylaxis: sequential compression devices, TED thigh-high  Chemical VTE (DVT) Prophylaxis: Aspirin 81 twice daily  Fluid Replacement  Crystalloid: see anesthesia record Blood: none  FFP: none   Specimens Removed: 1 to pathology  Sponge and Instrument Count Correct? yes  PACU: portable radiograph - knee AP and Lateral  Plan/RTC: Return in 2 weeks for suture removal.  Weight Bearing/Load Lower Extremity: full, no range of motion for 4 weeks to allow soft tissue healing and rest  Implant Name Type Inv. Item Serial No. Manufacturer Lot No. LRB No. Used Action  LINER TIB PS RM GH/12 16 RT - [id number] Liner LINER TIB PS RM GH/12 16 RT  ZIMMER RECON(ORTH,TRAU,BIO,SG) [id number] Right 1 Implanted  IMPL TAPESTRY BIOINTEGR 40X30 - [id number] Mesh General IMPL TAPESTRY BIOINTEGR 40X30  ZIMMER RECON(ORTH,TRAU,BIO,SG) [id number] Right 1 Implanted  IMPL TAPESTRY BIOINTEGR 40X30 - [id number] Mesh General IMPL TAPESTRY BIOINTEGR 40X30  ZIMMER RECON(ORTH,TRAU,BIO,SG) [id number] Right 1 Implanted    N. Ozell Cummins, MD [facility] 5:01 PM

## 2024-04-14 NOTE — Plan of Care (Signed)
  Problem: Education: Goal: Knowledge of General Education information will improve Description: Including pain rating scale, medication(s)/side effects and non-pharmacologic comfort measures 04/14/2024 1909 by Kennyth Vernell PARAS, RN Outcome: Progressing 04/14/2024 1909 by Kennyth Vernell PARAS, RN Outcome: Progressing 04/14/2024 1909 by Kennyth Vernell PARAS, RN Outcome: Progressing   Problem: Health Behavior/Discharge Planning: Goal: Ability to manage health-related needs will improve 04/14/2024 1909 by Kennyth Vernell PARAS, RN Outcome: Progressing 04/14/2024 1909 by Kennyth Vernell PARAS, RN Outcome: Progressing 04/14/2024 1909 by Kennyth Vernell PARAS, RN Outcome: Progressing   Problem: Clinical Measurements: Goal: Ability to maintain clinical measurements within normal limits will improve 04/14/2024 1909 by Kennyth Vernell PARAS, RN Outcome: Progressing 04/14/2024 1909 by Kennyth Vernell PARAS, RN Outcome: Progressing 04/14/2024 1909 by Kennyth Vernell PARAS, RN Outcome: Progressing Goal: Will remain free from infection 04/14/2024 1909 by Kennyth Vernell PARAS, RN Outcome: Progressing 04/14/2024 1909 by Kennyth Vernell PARAS, RN Outcome: Progressing 04/14/2024 1909 by Kennyth Vernell PARAS, RN Outcome: Progressing Goal: Diagnostic test results will improve 04/14/2024 1909 by Kennyth Vernell PARAS, RN Outcome: Progressing 04/14/2024 1909 by Kennyth Vernell PARAS, RN Outcome: Progressing 04/14/2024 1909 by Kennyth Vernell PARAS, RN Outcome: Progressing Goal: Respiratory complications will improve 04/14/2024 1909 by Kennyth Vernell PARAS, RN Outcome: Progressing 04/14/2024 1909 by Kennyth Vernell PARAS, RN Outcome: Progressing 04/14/2024 1909 by Kennyth Vernell PARAS, RN Outcome: Progressing Goal: Cardiovascular complication will be avoided 04/14/2024 1909 by Kennyth Vernell PARAS, RN Outcome: Progressing 04/14/2024 1909 by Kennyth Vernell PARAS, RN Outcome: Progressing 04/14/2024 1909 by Kennyth Vernell PARAS,  RN Outcome: Progressing

## 2024-04-14 NOTE — Telephone Encounter (Signed)
 Called and let a VM advising [name] of Dr. Benjiman message concerning patient.

## 2024-04-14 NOTE — Plan of Care (Signed)

## 2024-04-14 NOTE — Anesthesia Procedure Notes (Signed)
 Procedure Name: LMA Insertion Date/Time: 04/14/2024 3:37 PM  Performed by: Cindie Donald CROME, CRNAPre-anesthesia Checklist: Patient identified, Emergency Drugs available, Suction available and Patient being monitored Patient Re-evaluated:Patient Re-evaluated prior to induction Oxygen Delivery Method: Circle System Utilized Preoxygenation: Pre-oxygenation with 100% oxygen Induction Type: IV induction Ventilation: Mask ventilation without difficulty LMA: LMA inserted LMA Size: 5.0 Number of attempts: 1 Placement Confirmation: positive ETCO2 Tube secured with: Tape Dental Injury: Teeth and Oropharynx as per pre-operative assessment

## 2024-04-14 NOTE — H&P (Signed)

## 2024-04-14 NOTE — Transfer of Care (Signed)
 Immediate Anesthesia Transfer of Care Note  Patient: Justin Burnett  Procedure(s) Performed: IRRIGATION AND DEBRIDEMENT KNEE WITH POLY EXCHANGE (Right: Knee) APPLICATION, WOUND VAC (Right: Knee)  Patient Location: PACU  Anesthesia Type:General  Level of Consciousness: awake, alert , and oriented  Airway & Oxygen Therapy: Patient Spontanous Breathing and Patient connected to face mask oxygen  Post-op Assessment: Report given to RN and Post -op Vital signs reviewed and stable  Post vital signs: Reviewed and stable  Last Vitals:  Vitals Value Taken Time  BP 149/92 04/14/24 17:26  Temp    Pulse 83 04/14/24 17:29  Resp 12 04/14/24 17:29  SpO2 96 % 04/14/24 17:29  Vitals shown include unfiled device data.  Last Pain:  Vitals:   04/14/24 1432  TempSrc:   PainSc: 2          Complications: No notable events documented.

## 2024-04-14 NOTE — Progress Notes (Signed)
 I evaluated the patient in the ED this morning.  Patient is status post right total knee arthroplasty on 03/14/2024.  Patient had a mechanical fall directly onto his knee last night and suffered a complete wound dehiscence with exposed subcutaneous tissue and possible traumatic arthrotomy.  Extensor function is intact with 10 to 15 degrees of extensor lag.  Neurovascular intact distally.  Based on these findings patient will need formal I&D and likely poly exchange in the operating theater this afternoon.  Continue Ancef  every 8 hours.  Continue knee immobilizer at all times.  Continue n.p.o. We had a long discussion about the importance of alcohol and smoking cessation to maximize chances of a desirable outcome.  Detailed surgical plan discussed including risks benefits alternatives.  Informed consent obtained.  I will update the wife as well.  GEANNIE Ozell Cummins, MD [facility] 7:58 AM

## 2024-04-14 NOTE — Progress Notes (Signed)
 Orthopedic Tech Progress Note Patient Details:  Justin Burnett Nov 17, 1965 [id number]  Ortho Devices Type of Ortho Device: Knee Immobilizer Ortho Device/Splint Location: R KNEE Ortho Device/Splint Interventions: Ordered, Application, Adjustment   Post Interventions Patient Tolerated: Well Instructions Provided: Care of device  Justin Burnett L Justin Burnett 04/14/2024, 12:08 AM

## 2024-04-15 ENCOUNTER — Other Ambulatory Visit (HOSPITAL_COMMUNITY): Payer: Self-pay

## 2024-04-15 ENCOUNTER — Telehealth: Payer: Self-pay | Admitting: Physician Assistant

## 2024-04-15 ENCOUNTER — Encounter (HOSPITAL_COMMUNITY): Payer: Self-pay | Admitting: Orthopaedic Surgery

## 2024-04-15 ENCOUNTER — Ambulatory Visit

## 2024-04-15 MED ORDER — OXYCODONE-ACETAMINOPHEN 5-325 MG PO TABS
1.0000 | ORAL_TABLET | Freq: Every day | ORAL | 0 refills | Status: AC
  Filled 2024-04-15: qty 20, 10d supply, fill #0

## 2024-04-15 MED ORDER — GABAPENTIN 400 MG PO CAPS: 800.0000 mg | ORAL_CAPSULE | Freq: Two times a day (BID) | ORAL | Status: DC | PRN

## 2024-04-15 NOTE — Discharge Summary (Signed)
 Patient ID: Justin Burnett MRN: [id number] DOB/AGE: 19-Feb-1966 58 y.o.  Admit date: 04/13/2024 Discharge date: 04/15/2024  Admission Diagnoses:  Principal Problem:   Wound dehiscence Active Problems:   Status post total right knee replacement   Discharge Diagnoses:  Same  Past Medical History:  Diagnosis Date   Abnormal EKG    Chronic bilateral low back pain without sciatica 03/13/2022   Essential (primary) hypertension 09/26/2021   Hyperlipidemia 10/17/2021   Hypertension    Inflammatory bowel disease    by 12/28/2023 colonoscopy   Nerve damage    Neuropathy    Osteoarthritis 11/03/2011   Osteoarthritis of knees, bilateral 06/13/2022   Paresthesia 03/13/2022   Prediabetes 10/17/2021    Surgeries: Procedure(s): IRRIGATION AND DEBRIDEMENT KNEE WITH POLY EXCHANGE APPLICATION, WOUND VAC on 04/14/2024   Consultants:   Discharged Condition: Improved  Hospital Course: Justin Burnett is an 58 y.o. male who was admitted 04/13/2024 for operative treatment ofWound dehiscence. Patient has severe unremitting pain that affects sleep, daily activities, and work/hobbies. After pre-op clearance the patient was taken to the operating room on 04/14/2024 and underwent  Procedure(s): IRRIGATION AND DEBRIDEMENT KNEE WITH POLY EXCHANGE APPLICATION, WOUND VAC.    Patient was given perioperative antibiotics:  Anti-infectives (From admission, onward)    Start     Dose/Rate Route Frequency Ordered Stop   04/15/24 0600  ceFAZolin  (ANCEF ) IVPB 2g/100 mL premix  Status:  Discontinued        2 g 200 mL/hr over 30 Minutes Intravenous On call to O.R. 04/14/24 1333 04/14/24 1344   04/14/24 2200  doxycycline (VIBRA-TABS) tablet 100 mg        100 mg Oral 2 times daily 04/14/24 1831     04/14/24 1930  ceFAZolin  (ANCEF ) IVPB 2g/100 mL premix  Status:  Discontinued        2 g 200 mL/hr over 30 Minutes Intravenous Every 6 hours 04/14/24 1831 04/14/24 1854   04/14/24 1612  vancomycin  (VANCOCIN )  powder  Status:  Discontinued          As needed 04/14/24 1613 04/14/24 1721   04/14/24 1400  ceFAZolin  (ANCEF ) IVPB 2g/100 mL premix        2 g 200 mL/hr over 30 Minutes Intravenous To ShortStay Surgical 04/14/24 1333 04/14/24 1611   04/13/24 2345  ceFAZolin  (ANCEF ) IVPB 2g/100 mL premix        2 g 200 mL/hr over 30 Minutes Intravenous Every 8 hours 04/13/24 2332          Patient was given sequential compression devices, early ambulation, and chemoprophylaxis to prevent DVT.  Inpatient Morphine Milligram Equivalents Per Day 10/29 - 10/31   Values displayed are in units of MME/Day    Order Start / End Date 10/29 Yesterday Today    oxyCODONE  (Oxy IR/ROXICODONE ) immediate release tablet 5 mg 10/30 - 10/30 -- 0 of Unknown --    oxyCODONE  (ROXICODONE ) 5 MG/5ML solution 5 mg 10/30 - 10/30 -- 0 of Unknown --       Group total: 0 of Unknown     oxyCODONE  (Oxy IR/ROXICODONE ) immediate release tablet 5 mg 10/29 - 10/30 0 of 7.5 15 of 30 --    fentaNYL  (SUBLIMAZE ) injection 25-50 mcg 10/30 - 10/30 -- 30 of 45-90 --    fentaNYL  citrate (PF) (SUBLIMAZE ) injection 10/30 - 10/30 -- *75 of 75 --    oxyCODONE  (Oxy IR/ROXICODONE ) immediate release tablet 5 mg 10/30 - No end date -- 0 of 7.5 0 of 30  oxyCODONE  (Oxy IR/ROXICODONE ) immediate release tablet 10 mg 10/30 - No end date -- 15 of 15 15 of 60    HYDROmorphone  (DILAUDID ) injection 1 mg 10/30 - No end date -- 20 of 20 0 of 60    Daily Totals  0 of 7.5 * 155 of Unknown (at least 192.5-237.5) 15 of 150  *One-Step medication  Calculation Errors     Order Type Date Details   oxyCODONE  (Oxy IR/ROXICODONE ) immediate release tablet 5 mg Ordered Dose -- Insufficient frequency information   oxyCODONE  (ROXICODONE ) 5 MG/5ML solution 5 mg Ordered Dose -- Insufficient frequency information            Patient benefited maximally from hospital stay and there were no complications.    Recent vital signs: Patient Vitals for the past 24 hrs:  BP Temp  Temp src Pulse Resp SpO2 Height Weight  04/15/24 0432 137/83 98.5 F (36.9 C) Oral 69 18 96 % -- --  04/14/24 1958 135/77 98.5 F (36.9 C) -- 87 18 95 % -- --  04/14/24 1829 120/75 98.3 F (36.8 C) -- 77 15 97 % -- --  04/14/24 1815 138/82 -- -- 74 13 92 % -- --  04/14/24 1800 (!) 143/95 98.3 F (36.8 C) -- 78 18 92 % -- --  04/14/24 1745 (!) 139/92 -- -- 79 12 93 % -- --  04/14/24 1730 (!) 156/92 -- -- 93 13 95 % -- --  04/14/24 1724 (!) 149/92 98.4 F (36.9 C) -- 75 11 98 % -- --  04/14/24 1354 (!) 144/85 98 F (36.7 C) Oral 81 18 95 % 6' (1.829 m) 108.9 kg  04/14/24 1338 -- -- -- -- -- 99 % -- --  04/14/24 1313 (!) 143/89 98.9 F (37.2 C) Oral 75 16 99 % -- --     Recent laboratory studies:  Recent Labs    04/13/24 2300 04/14/24 1859  WBC 8.0 9.7  HGB 11.3* 12.6*  HCT 34.5* 38.5*  PLT 271 280  NA 139  --   K 3.3*  --   CL 105  --   CO2 21*  --   BUN 8  --   CREATININE 0.70  --   GLUCOSE 101*  --   CALCIUM  8.7*  --      Discharge Medications:   Allergies as of 04/15/2024       Reactions   Meperidine Other (See Comments)   Causes seizure-like activities Demerol        Medication List     STOP taking these medications    aspirin EC 81 MG tablet Replaced by: aspirin 81 MG chewable tablet   Eliquis  2.5 MG Tabs tablet Generic drug: apixaban    ibuprofen  800 MG tablet Commonly known as: ADVIL    meloxicam  7.5 MG tablet Commonly known as: MOBIC    mupirocin  ointment 2 % Commonly known as: BACTROBAN    SSD 1 % cream Generic drug: silver  sulfADIAZINE        TAKE these medications    amLODipine  10 MG tablet Commonly known as: NORVASC  Take 1 tablet (10 mg total) by mouth daily.   aspirin 81 MG chewable tablet Commonly known as: Aspirin Childrens Chew 1 tablet (81 mg total) by mouth 2 (two) times daily. To be taken after surgery to prevent blood clots Replaces: aspirin EC 81 MG tablet   atorvastatin  40 MG tablet Commonly known as:  LIPITOR Take 1 tablet (40 mg total) by mouth daily.   Cholecalciferol 25 MCG (1000  UT) capsule Take 1,000 Units by mouth daily.   docusate sodium  100 MG capsule Commonly known as: Colace Take 1 capsule (100 mg total) by mouth daily as needed.   doxycycline 100 MG tablet Commonly known as: VIBRA-TABS Take 1 tablet (100 mg total) by mouth 2 (two) times daily.   gabapentin  800 MG tablet Commonly known as: NEURONTIN  Take 1 tablet (800 mg total) by mouth 2 (two) times daily.   mesalamine  1.2 g EC tablet Commonly known as: LIALDA  Take 4 tablets (4.8 g total) by mouth daily with breakfast.   methocarbamol  750 MG tablet Commonly known as: ROBAXIN  Take 1 tablet (750 mg total) by mouth 3 (three) times daily as needed for muscle spasms.   ondansetron  4 MG tablet Commonly known as: ZOFRAN  Take 4 mg by mouth daily as needed for nausea or vomiting.   oxyCODONE -acetaminophen  5-325 MG tablet Commonly known as: PERCOCET/ROXICET Take 1 tablet by mouth daily.   vitamin C 1000 MG tablet Take 1,000 mg by mouth daily.               Durable Medical Equipment  (From admission, onward)           Start     Ordered   04/14/24 1832  DME Walker rolling  Once       Question Answer Comment  Walker: With 5 Inch Wheels   Patient needs a walker to treat with the following condition Status post left partial knee replacement      04/14/24 1831   04/14/24 1832  DME 3 n 1  Once        04/14/24 1831   04/14/24 1832  DME Bedside commode  Once       Question:  Patient needs a bedside commode to treat with the following condition  Answer:  Status post left partial knee replacement   04/14/24 1831            Diagnostic Studies: DG Knee 1-2 Views Right Result Date: 04/13/2024 CLINICAL DATA:  Fall EXAM: RIGHT KNEE - 1-2 VIEW COMPARISON:  03/15/2024, 03/14/2024 FINDINGS: Right knee replacement with intact hardware and normal alignment. No definitive displaced fracture is seen. Moderate gas  within the soft tissues and joint space. IMPRESSION: 1. Right knee replacement without acute fracture 2. Moderate gas within the soft tissues and joint space which could be due to a laceration or open wound, correlate with the physical exam Electronically Signed   By: Luke Bun M.D.   On: 04/13/2024 22:36    Disposition: Discharge disposition: 01-Home or Self Care          Follow-up Information     Jule Ronal CROME, PA-C. Schedule an appointment as soon as possible for a visit in 1 week(s).   Specialty: Orthopedic Surgery Contact information: [address] [place] [address] [phone]                  Signed: Ronal CROME Jule 04/15/2024, 7:15 AM

## 2024-04-15 NOTE — Progress Notes (Signed)
 Subjective: 1 Day Post-Op Procedure(s) (LRB): IRRIGATION AND DEBRIDEMENT KNEE WITH POLY EXCHANGE (Right) APPLICATION, WOUND VAC (Right) Patient reports pain as mild.    Objective: Vital signs in last 24 hours: Temp:  [98 F (36.7 C)-98.9 F (37.2 C)] 98.5 F (36.9 C) (10/31 0432) Pulse Rate:  [69-93] 69 (10/31 0432) Resp:  [11-18] 18 (10/31 0432) BP: (120-156)/(75-95) 137/83 (10/31 0432) SpO2:  [92 %-99 %] 96 % (10/31 0432) Weight:  [108.9 kg] 108.9 kg (10/30 1354)  Intake/Output from previous day: 10/30 0701 - 10/31 0700 In: 1200 [I.V.:1000; IV Piggyback:200] Out: 360 [Urine:350; Blood:10] Intake/Output this shift: No intake/output data recorded.  Recent Labs    04/13/24 2300 04/14/24 1859  HGB 11.3* 12.6*   Recent Labs    04/13/24 2300 04/14/24 1859  WBC 8.0 9.7  RBC 3.45* 3.86*  HCT 34.5* 38.5*  PLT 271 280   Recent Labs    04/13/24 2300  NA 139  K 3.3*  CL 105  CO2 21*  BUN 8  CREATININE 0.70  GLUCOSE 101*  CALCIUM  8.7*   No results for input(s): LABPT, INR in the last 72 hours.  Neurologically intact Neurovascular intact Sensation intact distally Intact pulses distally Dorsiflexion/Plantar flexion intact Incision: dressing C/D/I No cellulitis present Compartment soft   Assessment/Plan: 1 Day Post-Op Procedure(s) (LRB): IRRIGATION AND DEBRIDEMENT KNEE WITH POLY EXCHANGE (Right) APPLICATION, WOUND VAC (Right) Up with therapy WBAT RLE- Knee immobilizer at all times x 4 weeks.  NO ROM RIGHT KNEE Please swap out hospital vac unit with charged portable prevena prior to discharge D/c home once cleared by PT Abx/asa rx sent in yesterday.  Pain meds sent in a few days ago and has not picked up yet F/u in one week      Ronal LITTIE Grave 04/15/2024, 7:12 AM

## 2024-04-15 NOTE — Evaluation (Signed)
 Physical Therapy Brief Evaluation and Discharge Note Patient Details Name: Justin Burnett MRN: [id number] DOB: 27-Sep-1965 Today's Date: 04/15/2024   History of Present Illness  Pt is a 58 y.o. male who presented to [facility] 04/13/24 for surgical management of RLE traumatic wound dehiscence and traumatic arthrotomy after mechanical fall at home d/t alcohol intoxication. Pt s/p I&D, exchange of polyethylene liner, secondary closure, and application of incisional VAC 10/30. 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   Clinical Impression  Pt greeted supine in bed, pleasant and agreeable to PT evaluation. PTA, pt was modI for functional mobility using RW and modI for most basic self-care needs. He lives with his wife in a one story house with 5 STE. Overall, pt required supervision for functional mobility using RW and assist to manage lines/leads. Pt ambulated ~242ft with a step-through antalgic gait pattern. He completed 6 steps using BUE support on rails, ascending with LLE and descending with RLE. Pt is currently limited by RLE restrictions including KI on at all times and no knee ROM. He appears to be near his PLOF, no further acute PT needs. Pt feels ready and safe for discharge home. I have answered all his questions related to mobility.      PT Assessment Patient does not need any further PT services  Assistance Needed at Discharge  PRN    Equipment Recommendations None recommended by PT  Recommendations for Other Services       Precautions/Restrictions Precautions Precautions: Fall;Knee Precaution Booklet Issued: Yes (comment) Recall of Precautions/Restrictions: Intact Precaution/Restrictions Comments: Wound Vac RLE Required Braces or Orthoses: Knee Immobilizer - Right Knee Immobilizer - Right: On at all times Restrictions Weight Bearing Restrictions Per Provider Order: Yes RLE Weight Bearing Per Provider Order: Weight bearing as  tolerated Other Position/Activity Restrictions: Zero Degree Knee will be in place when pt is resting        Mobility  Bed Mobility   Supine/Sidelying to sit: Modified independent (Device/Increased time)   General bed mobility comments: Pt sat up on L side of bed with increased time and HOB elevated.  Transfers Overall transfer level: Needs assistance Equipment used: Rolling walker (2 wheels) Transfers: Sit to/from Stand, Bed to chair/wheelchair/BSC Sit to Stand: Supervision   Step pivot transfers: Supervision       General transfer comment: Pt demonstrated proper hand placement using RW. He powered up without physical assist. Transferred to recliner chair. Good eccentric control. Assist to manage lines/leads.    Ambulation/Gait Ambulation/Gait assistance: Supervision Gait Distance (Feet): 200 Feet (x2, standing rest break) Assistive device: Rolling walker (2 wheels) Gait Pattern/deviations: Step-through pattern, Decreased stride length, Decreased stance time - right, Antalgic Gait Speed: Pace WFL General Gait Details: Pt ambulated with a step-through gait pattern. He circumducted on RLE in order to advance while in [place]. Pt maintained upright posture and good proximity to RW. He navigated room/hallway well without LOB. Assist to manage lines/leads.  Home Activity Instructions Home Activity Instructions: Encouraged frequent mobilization of walks. Discussed HEP for RLE while maintaining no knee ROM. Educated pt on SLR, hip abd/add, and ankle pumps/circles.  Stairs Stairs: Yes Stairs assistance: Supervision Stair Management: Two rails, Forwards, Step to pattern Number of Stairs: 2 (x3) General stair comments: Educated pt to ascend with LLE and descend with RLE. He took each step one a time with BUE support on railings.  Modified Rankin (Stroke Patients Only)        Balance Overall balance assessment: Mild deficits  observed, not formally tested                         Pertinent Vitals/Pain PT - Brief Vital Signs All Vital Signs Stable: Yes Pain Assessment Pain Assessment: No/denies pain     Home Living Family/patient expects to be discharged to:: Private residence Living Arrangements: Spouse/significant other Available Help at Discharge: Family;Available 24 hours/day Home Environment: Stairs to enter;Rail - right;Rail - left  Stairs-Number of Steps: 5 Home Equipment: Grab bars - tub/shower;Shower Counsellor (2 wheels)        Prior Function Level of Independence: Independent with assistive device(s) Comments: Ambulates with RW. 1 fall last month. ModI with most basic self care, needs some help intermittently from wife.    UE/LE Assessment   UE ROM/Strength/Tone/Coordination: WFL    LE ROM/Strength/Tone/Coordination: Impaired LE ROM/Strength/Tone/Coordination Deficits: RLE: pt is in a KI, unable to assess knee d/t immobilization. Hip and Ankle AROM WFL. Decreased strength, grossly 3/5.    Communication   Communication Communication: No apparent difficulties     Cognition Overall Cognitive Status: Appears within functional limits for tasks assessed/performed       General Comments      Exercises     Assessment/Plan    PT Problem List         PT Visit Diagnosis Difficulty in walking, not elsewhere classified (R26.2);Other abnormalities of gait and mobility (R26.89)    No Skilled PT Patient is supervision for all activity/mobility;Patient will have necessary level of assist by caregiver at discharge;All education completed   Co-evaluation                AMPAC 6 Clicks Help needed turning from your back to your side while in a flat bed without using bedrails?: None Help needed moving from lying on your back to sitting on the side of a flat bed without using bedrails?: None Help needed moving to and from a bed to a chair (including a wheelchair)?: A Little Help needed standing up from a chair using your  arms (e.g., wheelchair or bedside chair)?: A Little Help needed to walk in hospital room?: A Little Help needed climbing 3-5 steps with a railing? : A Little 6 Click Score: 20      End of Session Equipment Utilized During Treatment: Gait belt;Right knee immobilizer Activity Tolerance: Patient tolerated treatment well Patient left: in chair;with call bell/phone within reach Nurse Communication: Mobility status PT Visit Diagnosis: Difficulty in walking, not elsewhere classified (R26.2);Other abnormalities of gait and mobility (R26.89)     Time: 9241-9183 PT Time Calculation (min) (ACUTE ONLY): 18 min  Charges:   PT Evaluation $PT Eval Low Complexity: 1 Low PT Treatments $Gait Training: 8-22 mins    Randall SAUNDERS, PT, DPT Acute Rehabilitation Services Office: [phone] Secure Chat Preferred  Delon CHRISTELLA Callander  04/15/2024, 8:31 AM

## 2024-04-15 NOTE — Telephone Encounter (Signed)
 done

## 2024-04-15 NOTE — Telephone Encounter (Signed)
 Pt called stating that he needs a refill of Oxycodone . [facility] [address]. Pt call back number is [phone]

## 2024-04-15 NOTE — Evaluation (Signed)
 Occupational Therapy Evaluation and Discharge Patient Details Name: Justin Burnett MRN: [id number] DOB: May 16, 1966 Today's Date: 04/15/2024   History of Present Illness   Pt is a 58 y.o. male who presented to [facility] 04/13/24 for surgical management of RLE traumatic wound dehiscence and traumatic arthrotomy after mechanical fall at home d/t alcohol intoxication. Pt s/p I&D, exchange of polyethylene liner, secondary closure, and application of incisional VAC 10/30. 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     Clinical Impressions Pt standing in bathroom using RW upon OT arrival. PTA pr reports that he Ind/mod I with ADLs/selfcare for functional mobility using RW. Pt lives with his wife in a 1 level house with 5 STE. Pt currently requires Sup wiith LB ADLs and mobility using RW. Pt is currently limited by R LE restrictions including KI on at all times and no knee ROM, but WBAT. Pt eager to d/c home today. All education completed and no further acute OT services are indicated at this time, OT will sign off   If plan is discharge home, recommend the following:   Assistance with cooking/housework;Assist for transportation;Help with stairs or ramp for entrance     Functional Status Assessment   Patient has had a recent decline in their functional status and demonstrates the ability to make significant improvements in function in a reasonable and predictable amount of time.     Equipment Recommendations   Tub/shower seat     Recommendations for Other Services         Precautions/Restrictions   Precautions Precautions: Fall;Knee Precaution Booklet Issued: Yes (comment) Recall of Precautions/Restrictions: Intact Precaution/Restrictions Comments: Wound Vac RLE Required Braces or Orthoses: Knee Immobilizer - Right Knee Immobilizer - Right: On at all times Restrictions Weight Bearing Restrictions Per Provider Order: Yes RLE Weight Bearing  Per Provider Order: Weight bearing as tolerated Other Position/Activity Restrictions: Zero Degree Knee will be in place when pt is resting, R LE WBAT     Mobility Bed Mobility               General bed mobility comments: pt using RW standinf in bathroom upon OT arrival    Transfers Overall transfer level: Needs assistance Equipment used: Rolling walker (2 wheels) Transfers: Sit to/from Stand, Bed to chair/wheelchair/BSC Sit to Stand: Modified independent (Device/Increase time)     Step pivot transfers: Modified independent (Device/Increase time)     General transfer comment: Pt in bathroom upon OT arrival      Balance Overall balance assessment: No apparent balance deficits (not formally assessed)                                         ADL either performed or assessed with clinical judgement   ADL Overall ADL's : Modified independent;At baseline                                       General ADL Comments: Sup with LB ADLs, otherwise Mod I, RW for mobility transfers to commode and shower     Vision Baseline Vision/History: 1 Wears glasses Ability to See in Adequate Light: 0 Adequate Patient Visual Report: No change from baseline       Perception         Praxis  Pertinent Vitals/Pain Pain Assessment Pain Assessment: No/denies pain     Extremity/Trunk Assessment Upper Extremity Assessment Upper Extremity Assessment: Overall WFL for tasks assessed   Lower Extremity Assessment Lower Extremity Assessment: Defer to PT evaluation   Cervical / Trunk Assessment Cervical / Trunk Assessment: Normal   Communication Communication Communication: No apparent difficulties   Cognition Arousal: Alert Behavior During Therapy: WFL for tasks assessed/performed, Flat affect Cognition: No apparent impairments                               Following commands: Intact       Cueing  General Comments           Exercises     Shoulder Instructions      Home Living Family/patient expects to be discharged to:: Private residence Living Arrangements: Spouse/significant other Available Help at Discharge: Family;Available PRN/intermittently Type of Home: House Home Access: Stairs to enter [employer] of Steps: 5 Entrance Stairs-Rails: Can reach both Home Layout: One level     Bathroom Shower/Tub: Producer, Television/film/video: Standard     Home Equipment: Grab bars - tub/shower;Shower Counsellor (2 wheels)          Prior Functioning/Environment Prior Level of Function : Independent/Modified Independent;Driving             Mobility Comments: RW ADLs Comments: Ind with ADLs/selfcare, home mgt, drives    OT Problem List: Decreased activity tolerance;Impaired balance (sitting and/or standing)   OT Treatment/Interventions:        OT Goals(Current goals can be found in the care plan section)   Acute Rehab OT Goals Patient Stated Goal: go home OT Goal Formulation: All assessment and education complete, DC therapy   OT Frequency:       Co-evaluation              AM-PAC OT 6 Clicks Daily Activity     Outcome Measure Help from another person eating meals?: None Help from another person taking care of personal grooming?: None Help from another person toileting, which includes using toliet, bedpan, or urinal?: None Help from another person bathing (including washing, rinsing, drying)?: A Little Help from another person to put on and taking off regular upper body clothing?: None Help from another person to put on and taking off regular lower body clothing?: A Little 6 Click Score: 22   End of Session Equipment Utilized During Treatment: Rolling walker (2 wheels) Nurse Communication: Mobility status  Activity Tolerance: Patient tolerated treatment well Patient left: in chair  OT Visit Diagnosis: Other abnormalities of gait and mobility  (R26.89)                Time: 8978-8952 OT Time Calculation (min): 26 min Charges:  OT General Charges $OT Visit: 1 Visit OT Evaluation $OT Eval Low Complexity: 1 Low OT Treatments $Self Care/Home Management : 8-22 mins    Jacques Karna Loose 04/15/2024, 11:42 AM

## 2024-04-15 NOTE — Telephone Encounter (Signed)
 Spoke with patient. Notified patient that pain medicine has been sent in. He finds the knee immobilizer very uncomfortable because it keeps sliding down when he walks. He has been using the knee ice wrap instead because he said that it keeps his knee straight as well. He is icing as needed as well.

## 2024-04-15 NOTE — TOC Initial Note (Signed)
 Transition of Care [facility]) - Initial/Assessment Note    Patient Details  Name: Justin Burnett MRN: [id number] Date of Birth: September 18, 1965  Transition of Care [facility]) CM/SW Contact:    Sudie Erminio Deems, RN Phone Number: 04/15/2024, 10:14 AM  Clinical Narrative:  Patient presented for traumatic wound dehiscence right knee-post I&D and Prevena Wound Vac to be applied. Inpatient Case Manager spoke with patient and he declines DME 3n1/RW and HH PT/OT; states he is active with Outpatient Rehab on [address] ICM did call this location and patient will need additional orders to proceed. ICM reached out to [facility] and he is agreeable for patient to be seen in the office initially before return to outpatient PT since ROM restrictions have not been lifted. No ambulatory order for outpatient PT sent at this time. Patient reports that he has all DME that is needed in the home. Patient has insurance. Staff RN to change out wound vac to prevena wound vac before discharge. Patient states spouse will provide transportation home. No further needs identified.   Expected Discharge Plan: Home/Self Care Barriers to Discharge: No Barriers Identified   Patient Goals and CMS Choice Patient states their goals for this hospitalization and ongoing recovery are:: plan to return home   Expected Discharge Plan and Services In-house Referral: NA Discharge Planning Services: CM Consult Post Acute Care Choice: NA Living arrangements for the past 2 months: Single Family Home Expected Discharge Date: 04/15/24                 DME Agency: NA       HH Arranged: NA  Prior Living Arrangements/Services Living arrangements for the past 2 months: Single Family Home Lives with:: Spouse Patient language and need for interpreter reviewed:: Yes Do you feel safe going back to the place where you live?: Yes      Need for Family Participation in Patient Care: Yes (Comment) Care giver support system in place?: Yes  (comment) Current home services: DME (patient states he has all DME that is needed.) Criminal Activity/Legal Involvement Pertinent to Current Situation/Hospitalization: No - Comment as needed  Activities of Daily Living   ADL Screening (condition at time of admission) Independently performs ADLs?: No Does the patient have a NEW difficulty with bathing/dressing/toileting/self-feeding that is expected to last >3 days?: No Does the patient have a NEW difficulty with getting in/out of bed, walking, or climbing stairs that is expected to last >3 days?: No Does the patient have a NEW difficulty with communication that is expected to last >3 days?: No Is the patient deaf or have difficulty hearing?: No Does the patient have difficulty seeing, even when wearing glasses/contacts?: No Does the patient have difficulty concentrating, remembering, or making decisions?: No  Permission Sought/Granted Permission sought to share information with : Family Supports, Case Manager    Emotional Assessment Appearance:: Appears stated age Attitude/Demeanor/Rapport: Engaged Affect (typically observed): Appropriate Orientation: : Oriented to Self, Oriented to Place, Oriented to  Time, Oriented to Situation Alcohol / Substance Use: Not Applicable Psych Involvement: No (comment)  Admission diagnosis:  Wound dehiscence [T81.30XA] Status post total right knee replacement [Z96.651] Patient Active Problem List   Diagnosis Date Noted   Wound dehiscence 04/13/2024   Status post total right knee replacement 03/14/2024   Status post total left knee replacement 10/26/2023   Preop cardiovascular exam 08/11/2023   Obesity (BMI 30.0-34.9) 08/11/2023   Cardiac murmur 08/11/2023   Cigarette smoker 08/11/2023   Prominent abdominal aortic pulsation 08/11/2023  Hypertension    Nerve damage    Neuromuscular disorder (HCC)    Abnormal EKG    Primary osteoarthritis of right knee 06/13/2022   Paresthesia 03/13/2022    Chronic bilateral low back pain without sciatica 03/13/2022   Hyperlipidemia 10/17/2021   Prediabetes 10/17/2021   Essential (primary) hypertension 09/26/2021   Neuropathy    Osteoarthritis 11/03/2011   PCP:  [name], NP Pharmacy:   [facility] [address] [place] [address] Phone: [phone] Fax: [phone]  [place] [facility] [address] [place] [address] Phone: [phone] Fax: [phone]     Social Drivers of Health (SDOH) Social History: SDOH Screenings   Food Insecurity: No Food Insecurity (04/14/2024)  Housing: Low Risk  (04/14/2024)  Transportation Needs: No Transportation Needs (04/14/2024)  Utilities: Not At Risk (04/14/2024)  Alcohol Screen: Low Risk  (07/23/2023)  Depression (PHQ2-9): Low Risk  (01/20/2024)  Financial Resource Strain: Low Risk  (01/20/2024)  Physical Activity: Sufficiently Active (07/23/2023)  Social Connections: Moderately Isolated (01/20/2024)  Stress: No Stress Concern Present (01/20/2024)  Tobacco Use: High Risk (04/14/2024)  Health Literacy: Adequate Health Literacy (01/20/2024)   SDOH Interventions:     Readmission Risk Interventions     No data to display

## 2024-04-18 ENCOUNTER — Encounter: Payer: Self-pay | Admitting: Radiology

## 2024-04-18 ENCOUNTER — Other Ambulatory Visit: Payer: Self-pay | Admitting: Family

## 2024-04-18 DIAGNOSIS — G609 Hereditary and idiopathic neuropathy, unspecified: Secondary | ICD-10-CM

## 2024-04-18 DIAGNOSIS — E785 Hyperlipidemia, unspecified: Secondary | ICD-10-CM

## 2024-04-19 ENCOUNTER — Ambulatory Visit: Admitting: Physical Therapy

## 2024-04-19 ENCOUNTER — Other Ambulatory Visit (HOSPITAL_COMMUNITY): Payer: Self-pay

## 2024-04-19 ENCOUNTER — Telehealth: Payer: Self-pay

## 2024-04-19 MED ORDER — ATORVASTATIN CALCIUM 40 MG PO TABS
40.0000 mg | ORAL_TABLET | Freq: Every day | ORAL | 0 refills | Status: DC
  Filled 2024-04-19: qty 90, 90d supply, fill #0

## 2024-04-19 MED ORDER — GABAPENTIN 800 MG PO TABS
800.0000 mg | ORAL_TABLET | Freq: Two times a day (BID) | ORAL | 2 refills | Status: DC
  Filled 2024-04-19: qty 60, 30d supply, fill #0
  Filled 2024-05-18: qty 60, 30d supply, fill #1
  Filled 2024-06-15: qty 60, 30d supply, fill #2
  Filled ????-??-??: fill #1

## 2024-04-19 NOTE — Transitions of Care (Post Inpatient/ED Visit) (Signed)
 04/19/2024  Name: GANON DEMASI MRN: [id number] DOB: [birth year]  Today's TOC FU Call Status: Today's TOC FU Call Status:: Successful TOC FU Call Completed TOC FU Call Complete Date: 04/19/24 [name] and Date of Birth confirmed.  Transition Care Management Follow-up Telephone Call Date of Discharge: 04/15/24 [facility]: [facility]) Type of Discharge: Inpatient Admission Primary Inpatient Discharge Diagnosis:: wound dehiscence How have you been since you were released from the hospital?: Better Any questions or concerns?: No  Items Reviewed: Did you receive and understand the discharge instructions provided?: Yes Medications obtained,verified, and reconciled?: Yes (Medications Reviewed) (He has all medications and did not have any questions about the med regime) Any new allergies since your discharge?: No Dietary orders reviewed?: Yes Type of Diet Ordered:: heart healthy, low sodium Do you have support at home?: Yes People in Home [RPT]: spouse Name of Support/Comfort Primary Source: his wife  Medications Reviewed Today: Medications Reviewed Today     Reviewed by Marvis Bradley, RN (Case Manager) on 04/19/24 at 1436  Med List Status: <None>   Medication Order Taking? Sig Documenting Provider Last Dose Status Informant  amLODipine  (NORVASC ) 10 MG tablet [id number] No Take 1 tablet (10 mg total) by mouth daily. [name], NP 04/13/2024 Active Self, Pharmacy Records  Ascorbic Acid (VITAMIN C) 1000 MG tablet [id number] No Take 1,000 mg by mouth daily. [provider] 04/13/2024 Active Self, Pharmacy Records  aspirin (ASPIRIN CHILDRENS) 81 MG chewable tablet [id number]  Chew 1 tablet (81 mg total) by mouth 2 (two) times daily. To be taken after surgery to prevent blood clots Jule Ronal CROME, PA-C  Active   atorvastatin  (LIPITOR) 40 MG tablet [id number]  Take 1 tablet (40 mg total) by mouth daily. [name], NP  Active   Cholecalciferol 25 MCG (1000  UT) capsule [id number] No Take 1,000 Units by mouth daily. [provider] 04/13/2024 Active Self, Pharmacy Records  docusate sodium  (COLACE) 100 MG capsule [id number] No Take 1 capsule (100 mg total) by mouth daily as needed.  Patient not taking: Reported on 04/14/2024   Jule Ronal CROME, PA-C Not Taking Active Self, Pharmacy Records  doxycycline (VIBRA-TABS) 100 MG tablet [id number]  Take 1 tablet (100 mg total) by mouth 2 (two) times daily. Jule Ronal CROME, PA-C  Active   gabapentin  (NEURONTIN ) 800 MG tablet [id number]  Take 1 tablet (800 mg total) by mouth 2 (two) times daily. [name], NP  Active   mesalamine  (LIALDA ) 1.2 g EC tablet [id number] No Take 4 tablets (4.8 g total) by mouth daily with breakfast. Abran Norleen SAILOR, MD 04/13/2024 Active Self, Pharmacy Records  methocarbamol  (ROBAXIN ) 750 MG tablet [id number] No Take 1 tablet (750 mg total) by mouth 3 (three) times daily as needed for muscle spasms.  Patient not taking: Reported on 04/14/2024   Jule Ronal CROME, PA-C Not Taking Active Self, Pharmacy Records  ondansetron  (ZOFRAN ) 4 MG tablet [id number] No Take 4 mg by mouth daily as needed for nausea or vomiting.  Patient not taking: Reported on 04/14/2024   [provider] Not Taking Active Self, Pharmacy Records  oxyCODONE -acetaminophen  (PERCOCET/ROXICET) 5-325 MG tablet [id number]  Take 1-2 tablets by mouth daily. Jerri Kay HERO, MD  Active             Home Care and Equipment/Supplies: Were Home Health Services Ordered?: No Any new equipment or medical supplies ordered?: Yes Name of Medical supply agency?: Prevena wound VAC was placed when he left the hospital  and he said it is still in place. Were you able to get the equipment/medical supplies?: Yes Do you have any questions related to the use of the equipment/supplies?: No  Functional Questionnaire: Do you need assistance with bathing/showering or dressing?: No Do you need assistance with meal preparation?:  No Do you need assistance with eating?: No Do you have difficulty maintaining continence: No Do you need assistance with getting out of bed/getting out of a chair/moving?: Yes (Ambulates with a RW. He also stated he has a knee immobilizer for RLE.) Do you have difficulty managing or taking your medications?: No  Follow up appointments reviewed: PCP Follow-up appointment confirmed?: No (He said he will call to schedule an appointment when he is ready) MD Provider Line Number:[phone] Given: No Specialist Hospital Follow-up appointment confirmed?: Yes Date of Specialist follow-up appointment?: 04/26/24 Follow-Up Specialty Provider:: Orthopedic surgeon.   He said he starts outpatient orthopedic rehab 04/28/2024 after he follows up with the orthopedic surgeon Do you need transportation to your follow-up appointment?: No Do you understand care options if your condition(s) worsen?: Yes-patient verbalized understanding    SIGNATURE Slater Diesel, RN

## 2024-04-19 NOTE — Telephone Encounter (Signed)
 Complete

## 2024-04-21 ENCOUNTER — Telehealth: Payer: Self-pay | Admitting: Orthopaedic Surgery

## 2024-04-21 ENCOUNTER — Ambulatory Visit: Attending: Physician Assistant

## 2024-04-21 DIAGNOSIS — M25561 Pain in right knee: Secondary | ICD-10-CM | POA: Diagnosis not present

## 2024-04-21 DIAGNOSIS — M6281 Muscle weakness (generalized): Secondary | ICD-10-CM | POA: Insufficient documentation

## 2024-04-21 DIAGNOSIS — Z96651 Presence of right artificial knee joint: Secondary | ICD-10-CM | POA: Diagnosis not present

## 2024-04-21 DIAGNOSIS — G8929 Other chronic pain: Secondary | ICD-10-CM | POA: Diagnosis not present

## 2024-04-21 NOTE — Telephone Encounter (Signed)
 Patient called and said he needs a refill on Oxycodone . CB#[phone]

## 2024-04-21 NOTE — Therapy (Signed)
 OUTPATIENT PHYSICAL THERAPY LOWER EXTREMITY EVALUATION   Patient Name: Justin Burnett MRN: [id number] DOB:10-16-1965, 58 y.o., male Today's Date: 04/21/2024  END OF SESSION:  PT End of Session - 04/21/24 1154     Visit Number 2    Number of Visits 17    Date for Recertification  06/02/24    Authorization Type MCD    PT Start Time 1100    PT Stop Time 1145    PT Time Calculation (min) 45 min    Activity Tolerance Patient tolerated treatment well    Behavior During Therapy [facility] for tasks assessed/performed           Past Medical History:  Diagnosis Date   Abnormal EKG    Chronic bilateral low back pain without sciatica 03/13/2022   Essential (primary) hypertension 09/26/2021   Hyperlipidemia 10/17/2021   Hypertension    Inflammatory bowel disease    by 12/28/2023 colonoscopy   Nerve damage    Neuropathy    Osteoarthritis 11/03/2011   Osteoarthritis of knees, bilateral 06/13/2022   Paresthesia 03/13/2022   Prediabetes 10/17/2021   Past Surgical History:  Procedure Laterality Date   APPLICATION OF WOUND VAC Right 04/14/2024   Procedure: APPLICATION, WOUND VAC;  Surgeon: Jerri Kay HERO, MD;  Location: [facility] OR;  Service: Orthopedics;  Laterality: Right;   FINGER SURGERY Left    As a child. Tip of middle finger cut off in wood shop   I & D KNEE WITH POLY EXCHANGE Right 04/14/2024   Procedure: IRRIGATION AND DEBRIDEMENT KNEE WITH POLY EXCHANGE;  Surgeon: Jerri Kay HERO, MD;  Location: [facility] OR;  Service: Orthopedics;  Laterality: Right;  irrigation and debridement with possible zimmer poly exchange   TOTAL KNEE ARTHROPLASTY Left 10/26/2023   Procedure: ARTHROPLASTY, KNEE, TOTAL;  Surgeon: Jerri Kay HERO, MD;  Location: [facility];  Service: Orthopedics;  Laterality: Left;   TOTAL KNEE ARTHROPLASTY Right 03/14/2024   Procedure: ARTHROPLASTY, KNEE, TOTAL;  Surgeon: Jerri Kay HERO, MD;  Location: [facility];  Service: Orthopedics;  Laterality: Right;   Patient Active Problem List   Diagnosis  Date Noted   Wound dehiscence 04/13/2024   Status post total right knee replacement 03/14/2024   Status post total left knee replacement 10/26/2023   Preop cardiovascular exam 08/11/2023   Obesity (BMI 30.0-34.9) 08/11/2023   Cardiac murmur 08/11/2023   Cigarette smoker 08/11/2023   Prominent abdominal aortic pulsation 08/11/2023   Hypertension    Nerve damage    Neuromuscular disorder (HCC)    Abnormal EKG    Primary osteoarthritis of right knee 06/13/2022   Paresthesia 03/13/2022   Chronic bilateral low back pain without sciatica 03/13/2022   Hyperlipidemia 10/17/2021   Prediabetes 10/17/2021   Essential (primary) hypertension 09/26/2021   Neuropathy    Osteoarthritis 11/03/2011    PCP: [name], NP   REFERRING PROVIDER: Jerri Kay HERO, MD   REFERRING DIAG: Primary osteoarthritis of right knee [M17.11]   THERAPY DIAG:  Chronic pain of right knee  Muscle weakness (generalized)  Acute pain of right knee  History of total right knee replacement  Rationale for Evaluation and Treatment: Rehabilitation  ONSET DATE: DOS 03/14/24  SUBJECTIVE:   SUBJECTIVE STATEMENT: Doing ok today with pain 2/10. Trying to do HEP every now and then; not consistent.  Pt arrives to PT today with hx of R TKA on 03/14/24. He was D/C home on 9/30 and returned later that day to the ED due to a fall, he reports it  took him a bit to come in due to falling. Overall he reports doing okay, biggest issue is the pain management, he reports pain today is 3/10 and denies any N/T, currently ambulates with RW. He reports doing some exercises at home home tightening quad, toe raises.   PERTINENT HISTORY: See PMHx PAIN:  2/10 Are you having pain? Yes: NPRS scale: 3/10 (took pain meds at 6:30), worse 9-10/10 Pain location: around the knee cap Pain description: aching, sore, tight Aggravating factors: standing, walking, bending the kned Relieving factors: Ice  PRECAUTIONS: None  RED  FLAGS: None   WEIGHT BEARING RESTRICTIONS: No  FALLS:  Has patient fallen in last 6 months? Yes. Number of falls 1  LIVING ENVIRONMENT: Lives with: lives with their spouse Lives in: House/apartment Stairs: Yes: External: 5 steps; can reach both Has following equipment at home: Walker - 2 wheeled  OCCUPATION: Unemployeed  PLOF: Independent  PATIENT GOALS: reduce, get back to doing daily tasks, to be able to work without restrictions.    OBJECTIVE:  Note: Objective measures were completed at Evaluation unless otherwise noted.  DIAGNOSTIC FINDINGS:  Xray 03/15/24 R knee IMPRESSION: 1. Intact right knee replacement. 2. Moderate severity anterior soft tissue swelling with a large joint effusion.  PATIENT SURVEYS:  LEFS  Extreme difficulty/unable (0), Quite a bit of difficulty (1), Moderate difficulty (2), Little difficulty (3), No difficulty (4) Survey date:  04/07/24  Any of your usual work, housework or school activities 1  2. Usual hobbies, recreational or sporting activities 1  3. Getting into/out of the bath 2  4. Walking between rooms 2  5. Putting on socks/shoes 2  6. Squatting  0  7. Lifting an object, like a bag of groceries from the floor 4  8. Performing light activities around your home 2  9. Performing heavy activities around your home 0  10. Getting into/out of a car 0  11. Walking 2 blocks 0  12. Walking 1 mile 0  13. Going up/down 10 stairs (1 flight) 0  14. Standing for 1 hour 2  15.  sitting for 1 hour 4  16. Running on even ground 0  17. Running on uneven ground 0  18. Making sharp turns while running fast 0  19. Hopping  0  20. Rolling over in bed 2  Score total:  22/80     COGNITION: Overall cognitive status: Within functional limits for tasks assessed     SENSATION: Not tested  EDEMA:  Unable to properly assess due to unable to roll pants up.    POSTURE: rounded shoulders and forward head  PALPATION: TTP   LOWER EXTREMITY  ROM:  Active ROM Right eval Left eval  Hip flexion    Hip extension    Hip abduction    Hip adduction    Hip internal rotation    Hip external rotation    Knee flexion 95A, 100AA   Knee extension 3 A   Ankle dorsiflexion    Ankle plantarflexion    Ankle inversion    Ankle eversion     (Blank rows = not tested)  LOWER EXTREMITY MMT:  MMT Right eval Left eval  Hip flexion 4-/5 4+/5  Hip extension    Hip abduction    Hip adduction    Hip internal rotation    Hip external rotation    Knee flexion 3-/5 4+/5  Knee extension 3-/5 4+/5  Ankle dorsiflexion    Ankle plantarflexion    Ankle inversion  Ankle eversion     (Blank rows = not tested)  FUNCTIONAL TESTS:  5 times sit to stand: 30 sec  GAIT: Distance walked: from waiting room Assistive device utilized: Environmental Consultant - 2 wheeled Level of assistance: Modified independence Comments: descreased stance on the RLE, and step length with LLE                                                                                                                                TREATMENT:  11/6 Therapeutic Exercise: Seated LAQ 2x8x3s Seated heel slide 2x8x3s SLR (appx 40 degrees) with some mild quad lag 2x6x3s Supine rope heel slide 2x12x3s  Seated hip march BL leg x8x3s Seated clamshell 2x12x3s RTB  Therapeutic Activity: HEP reassessment and update Standing heel raise x8x3s, x15x3s   [facility] Adult PT Treatment:                                                DATE: 04/07/2024 Heel slide supine with strap 1 x 10  Attempted SLR but unable to perform Seated hamstring stretch 1 x 30 sec Provided initial HEP    PATIENT EDUCATION:  Education details: evaluation findings, POC, goals, HEP with proper form/ rationale Person educated: Patient Education method: Explanation and Handouts Education comprehension: verbalized understanding  HOME EXERCISE PROGRAM: Access Code: [email or web address] URL: [email or web address] Date:  04/07/2024 Prepared by: Joneen Fresh  Exercises - Supine Heel Slide with Strap  - 1 x daily - 7 x weekly - 2 sets - 10 reps - 5 seconds hold - Seated Heel Slide  - 1 x daily - 7 x weekly - 2 sets - 10 reps - hold 5 sec hold - Seated Hamstring Stretch  - 1 x daily - 7 x weekly - 2 sets - 2 reps - 30 hold - Seated Long Arc Quad  - 1 x daily - 7 x weekly - 2 sets - 10 reps - Seated Heel Toe Raises  - 1 x daily - 7 x weekly - 2 sets - 10 reps Seated RTB clamshells 2x12x3s 5x/wk, 2x/day  ASSESSMENT:  CLINICAL IMPRESSION:  Patient tolerated treatment with no significant increases in pain with progressions in RLE loading alongside BL hip and calves. Current deficits include: RLE strength (quad and HS), ROM, functional strength, and activity tolerance. As a result, patient would continue to benefit from skilled PT to address said deficits via plan below.   Patient is a 58 y.o. M who was seen today for physical therapy evaluation and treatment for s/p TKA on 03/14/24. After being discharged from the hospital he had a fall and returned to the ED for further assessment. Increased time from referral to PT evaluation due to pt noting he would call to schedule in a few weeks which he states was due to the fall. R  knee total arc ROM is 3-95 degrees, MMT reveals gross weakness in the RLE. TTP located peri-patellar, with continued swelling noted visually compared bil. He currently ambualtes with RW with limiited stance on RLE and step length on LLE. He would benefit from physical therapy to decrease R knee pain, improve ROM and strength, maximize balance/ stability and overall function by addressing the deficits listed.   OBJECTIVE IMPAIRMENTS: difficulty walking, decreased ROM, decreased strength, increased edema, increased fascial restrictions, postural dysfunction, and pain.   ACTIVITY LIMITATIONS: carrying, lifting, standing, squatting, stairs, and locomotion level  PARTICIPATION LIMITATIONS: driving,  community activity, and occupation  PERSONAL FACTORS: Past/current experiences and 1 comorbidity: hx of pre-diabetes are also affecting patient's functional outcome.   REHAB POTENTIAL: Good  CLINICAL DECISION MAKING: Evolving/moderate complexity  EVALUATION COMPLEXITY: Moderate   GOALS: Goals reviewed with patient? Yes  SHORT TERM GOALS: Target date: 05/05/2024   Pt to be IND with initial HEP for therapeutic progression  Baseline: no prior HEP Goal status: INITIAL  2.  Increase knee flexion to >/= 110 degress to promote functional ROM.  Baseline:  Goal status: INITIAL  3.  Pt to verbalize / demo efficient gait pattern with LRAD to maximize safety Baseline: RW with decreased stance time on RLE/ step length LLE Goal status: INITIAL  4.  Improve LEFS to >/= 32/80 to demonstrate improving function Baseline: 22/80 Goal status: INITIAL   LONG TERM GOALS: Target date: 06/02/2024   Improve R knee total arc ROM to 0 - 120 for functional ROM required for ADLs Baseline:  Goal status: INITIAL  2.  Pt to be able to stand/ walk for >/= 60 min with LRAD with no report of pain or limitations to promote endurance required for work and ADLs Baseline: unable to stand or walk for 10-15 min  Goal status: INITIAL  3.  Improve LEFS to >/= 60/80 to demo improvement in function Baseline:  Goal status: INITIAL  4.  Pt to be able to perform every day tasks/ ADLs independenlty with no report of pain or limitations per his personal goals.  Baseline:  Goal status: INITIAL  4.  Pt to be IND with all HEP given and is able to maintain and progress her current LOF IND.  Baseline: no prior HEP Goal status: INITIAL PLAN:  PT FREQUENCY: 1-2x/week  PT DURATION: 12 weeks  PLANNED INTERVENTIONS: 97110-Therapeutic exercises, 97530- Therapeutic activity, [id number]- Neuromuscular re-education, 97535- Self Care, [id number]- Manual therapy, [id number]- Gait training, [phone]- Aquatic Therapy, [phone]- Electrical  stimulation (unattended), 97016- Vasopneumatic device, 20560 (1-2 muscles), 20561 (3+ muscles)- Dry Needling, Patient/Family education, Stair training, Taping, Joint mobilization, Cryotherapy, and Moist heat  PLAN FOR NEXT SESSION: Review/ update HEP PRN. Knee ROM, strength, gait training, modalities PRN.    [place] Odessia Scot  PT, DPT     For all possible CPT codes, reference the Planned Interventions line above.     Check all conditions that are expected to impact treatment: {Conditions expected to impact treatment:Diabetes mellitus and Musculoskeletal disorders   If treatment provided at initial evaluation, no treatment charged due to lack of authorization.

## 2024-04-21 NOTE — Anesthesia Postprocedure Evaluation (Signed)
 Anesthesia Post Note  Patient: Justin Burnett  Procedure(s) Performed: IRRIGATION AND DEBRIDEMENT KNEE WITH POLY EXCHANGE (Right: Knee) APPLICATION, WOUND VAC (Right: Knee)     Patient location during evaluation: PACU Anesthesia Type: General Level of consciousness: awake and alert Pain management: pain level controlled Vital Signs Assessment: post-procedure vital signs reviewed and stable Respiratory status: spontaneous breathing, nonlabored ventilation and respiratory function stable Cardiovascular status: blood pressure returned to baseline and stable Postop Assessment: no apparent nausea or vomiting Anesthetic complications: no   No notable events documented.              Joshua Soulier

## 2024-04-22 ENCOUNTER — Telehealth: Payer: Self-pay

## 2024-04-22 ENCOUNTER — Other Ambulatory Visit: Payer: Self-pay | Admitting: Physician Assistant

## 2024-04-22 ENCOUNTER — Other Ambulatory Visit (HOSPITAL_COMMUNITY): Payer: Self-pay

## 2024-04-22 MED ORDER — HYDROCODONE-ACETAMINOPHEN 5-325 MG PO TABS
1.0000 | ORAL_TABLET | Freq: Two times a day (BID) | ORAL | 0 refills | Status: DC | PRN
  Filled 2024-04-22: qty 10, 5d supply, fill #0
  Filled 2024-04-25: qty 20, 10d supply, fill #0

## 2024-04-22 NOTE — Telephone Encounter (Signed)
 IC spoke with Dr Jerri who advised the following: Patient to wash hands, remove wound vac apply dry dressing/ABD bandage.  I did call patient back and let him know. He verbalized understanding. If he has any issues/concerns he will call on call provider over the weekend. Advised patient to also keep incision clean and dry.

## 2024-04-22 NOTE — Telephone Encounter (Signed)
 Patient called and stated he has a tube hooked to a portable machine (wound vac) and the lights went off. Please advise

## 2024-04-22 NOTE — Telephone Encounter (Signed)
 Cannot refill oxy, but sent in norco with diff instructions

## 2024-04-22 NOTE — Telephone Encounter (Signed)
 Thanks

## 2024-04-25 ENCOUNTER — Other Ambulatory Visit (HOSPITAL_COMMUNITY): Payer: Self-pay

## 2024-04-26 ENCOUNTER — Ambulatory Visit: Admitting: Orthopaedic Surgery

## 2024-04-26 ENCOUNTER — Ambulatory Visit: Admitting: Physical Therapy

## 2024-04-26 DIAGNOSIS — Z96651 Presence of right artificial knee joint: Secondary | ICD-10-CM

## 2024-04-26 DIAGNOSIS — T8130XA Disruption of wound, unspecified, initial encounter: Secondary | ICD-10-CM

## 2024-04-26 NOTE — Progress Notes (Signed)
 Post-Op Visit Note   Patient: Justin Burnett           Date of Birth: Apr 12, 1966           MRN: [id number] Visit Date: 04/26/2024 PCP: Jaycee Greig PARAS, NP   Assessment & Plan:  Chief Complaint:  Chief Complaint  Patient presents with   Right Knee - Follow-up    Right knee I&D with poly exchange 04/14/2024.  Right TKA 03/14/2024   Visit Diagnoses:  1. Status post total right knee replacement   2. Wound dehiscence     Plan: History of Present Illness Justin Burnett is a 58 year old male who presents for follow-up status post right knee I&D and poly swap for traumatic wound dehiscence from mechanical fall shortly after index total knee arthroplasty..  He sustained the knee injury approximately two weeks ago and experiences intermittent knee pain.  Right knee surgical incision is healing.  There is no drainage.  There is a large joint effusion.  Neurovascular intact distally.  Assessment and Plan Nearly 2 weeks status post right knee I&D and poly exchange for traumatic wound dehiscence - Provided locked Bledsoe brace to prevent flexion. - Kept stitches for one more week. - Scheduled follow-up for stitch removal next week. - Will continue to hold on range of motion exercises.  Can work on gait and balance. - Applied new Aquacel over wound.  Follow-Up Instructions: Return in about 1 week (around 05/03/2024) for with lindsey.   Orders:  No orders of the defined types were placed in this encounter.  No orders of the defined types were placed in this encounter.   Imaging: No results found.  PMFS History: Patient Active Problem List   Diagnosis Date Noted   Wound dehiscence 04/13/2024   Status post total right knee replacement 03/14/2024   Status post total left knee replacement 10/26/2023   Preop cardiovascular exam 08/11/2023   Obesity (BMI 30.0-34.9) 08/11/2023   Cardiac murmur 08/11/2023   Cigarette smoker 08/11/2023   Prominent abdominal aortic pulsation 08/11/2023    Hypertension    Nerve damage    Neuromuscular disorder (HCC)    Abnormal EKG    Primary osteoarthritis of right knee 06/13/2022   Paresthesia 03/13/2022   Chronic bilateral low back pain without sciatica 03/13/2022   Hyperlipidemia 10/17/2021   Prediabetes 10/17/2021   Essential (primary) hypertension 09/26/2021   Neuropathy    Osteoarthritis 11/03/2011   Past Medical History:  Diagnosis Date   Abnormal EKG    Chronic bilateral low back pain without sciatica 03/13/2022   Essential (primary) hypertension 09/26/2021   Hyperlipidemia 10/17/2021   Hypertension    Inflammatory bowel disease    by 12/28/2023 colonoscopy   Nerve damage    Neuropathy    Osteoarthritis 11/03/2011   Osteoarthritis of knees, bilateral 06/13/2022   Paresthesia 03/13/2022   Prediabetes 10/17/2021    Family History  Problem Relation Age of Onset   Colon cancer Neg Hx    Esophageal cancer Neg Hx    Rectal cancer Neg Hx    Stomach cancer Neg Hx    Colon polyps Neg Hx     Past Surgical History:  Procedure Laterality Date   APPLICATION OF WOUND VAC Right 04/14/2024   Procedure: APPLICATION, WOUND VAC;  Surgeon: Jerri Kay HERO, MD;  Location: [facility];  Service: Orthopedics;  Laterality: Right;   FINGER SURGERY Left    As a child. Tip of middle finger cut off in wood shop  I & D KNEE WITH POLY EXCHANGE Right 04/14/2024   Procedure: IRRIGATION AND DEBRIDEMENT KNEE WITH POLY EXCHANGE;  Surgeon: Jerri Kay HERO, MD;  Location: [facility];  Service: Orthopedics;  Laterality: Right;  irrigation and debridement with possible zimmer poly exchange   TOTAL KNEE ARTHROPLASTY Left 10/26/2023   Procedure: ARTHROPLASTY, KNEE, TOTAL;  Surgeon: Jerri Kay HERO, MD;  Location: [facility];  Service: Orthopedics;  Laterality: Left;   TOTAL KNEE ARTHROPLASTY Right 03/14/2024   Procedure: ARTHROPLASTY, KNEE, TOTAL;  Surgeon: Jerri Kay HERO, MD;  Location: [facility];  Service: Orthopedics;  Laterality: Right;   Social History   Occupational  History   Not on file  Tobacco Use   Smoking status: Every Day    Current packs/day: 0.50    Average packs/day: 0.5 packs/day for 13.0 years (6.5 ttl pk-yrs)    Types: Cigarettes    Passive exposure: Current   Smokeless tobacco: Never  Vaping Use   Vaping status: Never Used  Substance and Sexual Activity   Alcohol use: Not Currently    Alcohol/week: 3.0 standard drinks of alcohol    Types: 3 Cans of beer per week    Comment: 1 pint liquer per week   Drug use: No   Sexual activity: Not Currently

## 2024-04-28 ENCOUNTER — Ambulatory Visit

## 2024-05-03 ENCOUNTER — Ambulatory Visit

## 2024-05-03 ENCOUNTER — Ambulatory Visit (INDEPENDENT_AMBULATORY_CARE_PROVIDER_SITE_OTHER): Admitting: Physician Assistant

## 2024-05-03 ENCOUNTER — Other Ambulatory Visit (HOSPITAL_COMMUNITY): Payer: Self-pay

## 2024-05-03 DIAGNOSIS — Z96651 Presence of right artificial knee joint: Secondary | ICD-10-CM

## 2024-05-03 MED ORDER — HYDROCODONE-ACETAMINOPHEN 5-325 MG PO TABS
1.0000 | ORAL_TABLET | Freq: Two times a day (BID) | ORAL | 0 refills | Status: DC | PRN
  Filled 2024-05-03: qty 20, 10d supply, fill #0

## 2024-05-03 NOTE — Progress Notes (Signed)
 Post-Op Visit Note   Patient: Justin Burnett           Date of Birth: [birth year]           MRN: [id number] Visit Date: 05/03/2024 PCP: Jaycee Greig PARAS, NP   Assessment & Plan:  Chief Complaint:  Chief Complaint  Patient presents with   Right Knee - Follow-up    Right knee I&D with poly exchange 04/14/2024.  Right TKA 03/14/2024   Visit Diagnoses:  1. Status post total right knee replacement     Plan: Patient is a pleasant-year-old gentleman who comes in today 2 weeks status post right knee I&D with poly exchange 04/14/2024.  He has been doing okay.  He has recently finished his aspirin for which he was taking for DVT prophylaxis.  He has been taking Norco for pain.  He has been wearing his hinged knee brace locked in extension.  Currently ambulating with a walker.  Examination of the right knee reveals a well-healed surgical incision with nylon sutures in place.  No evidence of infection or cellulitis.  Calves are soft nontender.  He is neurovascularly intact distally.  Today, sutures were removed and Steri-Strips applied.  At this point, would like to have him continue wearing his hinged knee brace locked in full extension for another 2 weeks.  Continue to avoid range of motion exercises for the next 2 weeks.  Follow-up in 2 weeks for recheck.  Call with concerns or questions.  Follow-Up Instructions: Return in about 2 weeks (around 05/17/2024).   Orders:  No orders of the defined types were placed in this encounter.  Meds ordered this encounter  Medications   HYDROcodone -acetaminophen  (NORCO/VICODIN) 5-325 MG tablet    Sig: Take 1 tablet by mouth 2 (two) times daily as needed.    Dispense:  20 tablet    Refill:  0    Imaging: No results found.  PMFS History: Patient Active Problem List   Diagnosis Date Noted   Wound dehiscence 04/13/2024   Status post total right knee replacement 03/14/2024   Status post total left knee replacement 10/26/2023   Preop cardiovascular exam  08/11/2023   Obesity (BMI 30.0-34.9) 08/11/2023   Cardiac murmur 08/11/2023   Cigarette smoker 08/11/2023   Prominent abdominal aortic pulsation 08/11/2023   Hypertension    Nerve damage    Neuromuscular disorder (HCC)    Abnormal EKG    Primary osteoarthritis of right knee 06/13/2022   Paresthesia 03/13/2022   Chronic bilateral low back pain without sciatica 03/13/2022   Hyperlipidemia 10/17/2021   Prediabetes 10/17/2021   Essential (primary) hypertension 09/26/2021   Neuropathy    Osteoarthritis 11/03/2011   Past Medical History:  Diagnosis Date   Abnormal EKG    Chronic bilateral low back pain without sciatica 03/13/2022   Essential (primary) hypertension 09/26/2021   Hyperlipidemia 10/17/2021   Hypertension    Inflammatory bowel disease    by 12/28/2023 colonoscopy   Nerve damage    Neuropathy    Osteoarthritis 11/03/2011   Osteoarthritis of knees, bilateral 06/13/2022   Paresthesia 03/13/2022   Prediabetes 10/17/2021    Family History  Problem Relation Age of Onset   Colon cancer Neg Hx    Esophageal cancer Neg Hx    Rectal cancer Neg Hx    Stomach cancer Neg Hx    Colon polyps Neg Hx     Past Surgical History:  Procedure Laterality Date   APPLICATION OF WOUND VAC Right 04/14/2024   Procedure:  APPLICATION, WOUND VAC;  Surgeon: Jerri Kay HERO, MD;  Location: [facility];  Service: Orthopedics;  Laterality: Right;   FINGER SURGERY Left    As a child. Tip of middle finger cut off in wood shop   I & D KNEE WITH POLY EXCHANGE Right 04/14/2024   Procedure: IRRIGATION AND DEBRIDEMENT KNEE WITH POLY EXCHANGE;  Surgeon: Jerri Kay HERO, MD;  Location: [facility];  Service: Orthopedics;  Laterality: Right;  irrigation and debridement with possible zimmer poly exchange   TOTAL KNEE ARTHROPLASTY Left 10/26/2023   Procedure: ARTHROPLASTY, KNEE, TOTAL;  Surgeon: Jerri Kay HERO, MD;  Location: [facility];  Service: Orthopedics;  Laterality: Left;   TOTAL KNEE ARTHROPLASTY Right 03/14/2024    Procedure: ARTHROPLASTY, KNEE, TOTAL;  Surgeon: Jerri Kay HERO, MD;  Location: [facility];  Service: Orthopedics;  Laterality: Right;   Social History   Occupational History   Not on file  Tobacco Use   Smoking status: Every Day    Current packs/day: 0.50    Average packs/day: 0.5 packs/day for 13.0 years (6.5 ttl pk-yrs)    Types: Cigarettes    Passive exposure: Current   Smokeless tobacco: Never  Vaping Use   Vaping status: Never Used  Substance and Sexual Activity   Alcohol use: Not Currently    Alcohol/week: 3.0 standard drinks of alcohol    Types: 3 Cans of beer per week    Comment: 1 pint liquer per week   Drug use: No   Sexual activity: Not Currently

## 2024-05-05 ENCOUNTER — Ambulatory Visit: Admitting: Physical Therapy

## 2024-05-10 ENCOUNTER — Other Ambulatory Visit (HOSPITAL_COMMUNITY): Payer: Self-pay

## 2024-05-10 ENCOUNTER — Other Ambulatory Visit: Payer: Self-pay | Admitting: Family

## 2024-05-10 DIAGNOSIS — I10 Essential (primary) hypertension: Secondary | ICD-10-CM

## 2024-05-10 MED ORDER — AMLODIPINE BESYLATE 10 MG PO TABS
10.0000 mg | ORAL_TABLET | Freq: Every day | ORAL | 0 refills | Status: AC
  Filled 2024-05-10: qty 90, 90d supply, fill #0

## 2024-05-16 ENCOUNTER — Other Ambulatory Visit: Payer: Self-pay | Admitting: Physician Assistant

## 2024-05-16 ENCOUNTER — Other Ambulatory Visit: Payer: Self-pay

## 2024-05-16 ENCOUNTER — Other Ambulatory Visit (HOSPITAL_COMMUNITY): Payer: Self-pay

## 2024-05-16 MED ORDER — HYDROCODONE-ACETAMINOPHEN 5-325 MG PO TABS
1.0000 | ORAL_TABLET | Freq: Two times a day (BID) | ORAL | 0 refills | Status: DC | PRN
  Filled 2024-05-16 – 2024-05-18 (×2): qty 20, 10d supply, fill #0
  Filled ????-??-??: fill #0

## 2024-05-17 ENCOUNTER — Other Ambulatory Visit: Payer: Self-pay | Admitting: Physician Assistant

## 2024-05-17 ENCOUNTER — Telehealth: Payer: Self-pay | Admitting: Physician Assistant

## 2024-05-17 NOTE — Telephone Encounter (Signed)
 Norco sent in yesterday.  Cannot send in oxy

## 2024-05-17 NOTE — Telephone Encounter (Signed)
 Pt called requesting a refill of oxycodone . Please to [facility]. Pt number is [phone].

## 2024-05-18 ENCOUNTER — Other Ambulatory Visit (HOSPITAL_COMMUNITY): Payer: Self-pay

## 2024-05-18 ENCOUNTER — Other Ambulatory Visit: Payer: Self-pay

## 2024-05-18 ENCOUNTER — Ambulatory Visit: Admitting: Physician Assistant

## 2024-05-18 DIAGNOSIS — Z96651 Presence of right artificial knee joint: Secondary | ICD-10-CM

## 2024-05-18 NOTE — Progress Notes (Signed)
 Post-Op Visit Note   Patient: Justin Burnett           Date of Birth: 15-Apr-1966           MRN: [id number] Visit Date: 05/18/2024 PCP: Jaycee Greig PARAS, NP   Assessment & Plan:  Chief Complaint:  Chief Complaint  Patient presents with   Right Knee - Follow-up, Routine Post Op    Right knee I&D with poly exchange 04/14/2024.  Right TKA 03/14/2024   Visit Diagnoses:  1. Status post total right knee replacement     Plan: patient comes in today 9 weeks s/p right TKA and 5 weeks s/p right knee I&D and poly exchange.  He has been wearing a bledsoe brace locked in full extension.  Only complaining of a dull ache and overall doing well.  Right knee exam shows a fully healed surgical scar.  No signs of infection or cellulitis.  Calf is soft and non-tender.  Today, I have unlocked the brace to full flexion. We will let PT help him wean out of the brace as he does not feel comfortable doing so at this time.  I sent in a new referral for outpatient PT to begin range of motion exercises.  He will follow-up with Dr. Jerri in 4 weeks for recheck.  Call with concerns or questions in the meantime.  Follow-Up Instructions: Return in about 4 weeks (around 06/15/2024) for with Dr. Jerri.   Orders:  Orders Placed This Encounter  Procedures   XR Knee 1-2 Views Right   Ambulatory referral to Physical Therapy   No orders of the defined types were placed in this encounter.   Imaging: No results found.  PMFS History: Patient Active Problem List   Diagnosis Date Noted   Wound dehiscence 04/13/2024   Status post total right knee replacement 03/14/2024   Status post total left knee replacement 10/26/2023   Preop cardiovascular exam 08/11/2023   Obesity (BMI 30.0-34.9) 08/11/2023   Cardiac murmur 08/11/2023   Cigarette smoker 08/11/2023   Prominent abdominal aortic pulsation 08/11/2023   Hypertension    Nerve damage    Neuromuscular disorder (HCC)    Abnormal EKG    Primary osteoarthritis of right knee  06/13/2022   Paresthesia 03/13/2022   Chronic bilateral low back pain without sciatica 03/13/2022   Hyperlipidemia 10/17/2021   Prediabetes 10/17/2021   Essential (primary) hypertension 09/26/2021   Neuropathy    Osteoarthritis 11/03/2011   Past Medical History:  Diagnosis Date   Abnormal EKG    Chronic bilateral low back pain without sciatica 03/13/2022   Essential (primary) hypertension 09/26/2021   Hyperlipidemia 10/17/2021   Hypertension    Inflammatory bowel disease    by 12/28/2023 colonoscopy   Nerve damage    Neuropathy    Osteoarthritis 11/03/2011   Osteoarthritis of knees, bilateral 06/13/2022   Paresthesia 03/13/2022   Prediabetes 10/17/2021    Family History  Problem Relation Age of Onset   Colon cancer Neg Hx    Esophageal cancer Neg Hx    Rectal cancer Neg Hx    Stomach cancer Neg Hx    Colon polyps Neg Hx     Past Surgical History:  Procedure Laterality Date   APPLICATION OF WOUND VAC Right 04/14/2024   Procedure: APPLICATION, WOUND VAC;  Surgeon: Jerri Kay HERO, MD;  Location: [facility] OR;  Service: Orthopedics;  Laterality: Right;   FINGER SURGERY Left    As a child. Tip of middle finger cut off in wood  shop   I & D KNEE WITH POLY EXCHANGE Right 04/14/2024   Procedure: IRRIGATION AND DEBRIDEMENT KNEE WITH POLY EXCHANGE;  Surgeon: Jerri Kay HERO, MD;  Location: [facility] OR;  Service: Orthopedics;  Laterality: Right;  irrigation and debridement with possible zimmer poly exchange   TOTAL KNEE ARTHROPLASTY Left 10/26/2023   Procedure: ARTHROPLASTY, KNEE, TOTAL;  Surgeon: Jerri Kay HERO, MD;  Location: [facility] OR;  Service: Orthopedics;  Laterality: Left;   TOTAL KNEE ARTHROPLASTY Right 03/14/2024   Procedure: ARTHROPLASTY, KNEE, TOTAL;  Surgeon: Jerri Kay HERO, MD;  Location: [facility];  Service: Orthopedics;  Laterality: Right;   Social History   Occupational History   Not on file  Tobacco Use   Smoking status: Every Day    Current packs/day: 0.50    Average packs/day: 0.5  packs/day for 13.0 years (6.5 ttl pk-yrs)    Types: Cigarettes    Passive exposure: Current   Smokeless tobacco: Never  Vaping Use   Vaping status: Never Used  Substance and Sexual Activity   Alcohol use: Not Currently    Alcohol/week: 3.0 standard drinks of alcohol    Types: 3 Cans of beer per week    Comment: 1 pint liquer per week   Drug use: No   Sexual activity: Not Currently

## 2024-05-23 ENCOUNTER — Other Ambulatory Visit: Payer: Self-pay

## 2024-05-23 ENCOUNTER — Ambulatory Visit: Attending: Physician Assistant

## 2024-05-23 DIAGNOSIS — M25561 Pain in right knee: Secondary | ICD-10-CM | POA: Insufficient documentation

## 2024-05-23 DIAGNOSIS — Z96651 Presence of right artificial knee joint: Secondary | ICD-10-CM | POA: Insufficient documentation

## 2024-05-23 DIAGNOSIS — M6281 Muscle weakness (generalized): Secondary | ICD-10-CM | POA: Diagnosis present

## 2024-05-23 NOTE — Therapy (Unsigned)
 OUTPATIENT PHYSICAL THERAPY LOWER EXTREMITY EVALUATION   Patient Name: Justin Burnett MRN: [id number] DOB:October 05, 1965, 58 y.o., male Today's Date: 05/24/2024  END OF SESSION:  PT End of Session - 05/24/24 1411     Visit Number 1    Number of Visits 12    Date for Recertification  07/24/24    Authorization Type MCD    PT Start Time 1455    PT Stop Time 1530    PT Time Calculation (min) 35 min    Activity Tolerance Patient tolerated treatment well    Behavior During Therapy [facility] for tasks assessed/performed          Past Medical History:  Diagnosis Date   Abnormal EKG    Chronic bilateral low back pain without sciatica 03/13/2022   Essential (primary) hypertension 09/26/2021   Hyperlipidemia 10/17/2021   Hypertension    Inflammatory bowel disease    by 12/28/2023 colonoscopy   Nerve damage    Neuropathy    Osteoarthritis 11/03/2011   Osteoarthritis of knees, bilateral 06/13/2022   Paresthesia 03/13/2022   Prediabetes 10/17/2021   Past Surgical History:  Procedure Laterality Date   APPLICATION OF WOUND VAC Right 04/14/2024   Procedure: APPLICATION, WOUND VAC;  Surgeon: Jerri Kay HERO, MD;  Location: [facility];  Service: Orthopedics;  Laterality: Right;   FINGER SURGERY Left    As a child. Tip of middle finger cut off in wood shop   I & D KNEE WITH POLY EXCHANGE Right 04/14/2024   Procedure: IRRIGATION AND DEBRIDEMENT KNEE WITH POLY EXCHANGE;  Surgeon: Jerri Kay HERO, MD;  Location: [facility];  Service: Orthopedics;  Laterality: Right;  irrigation and debridement with possible zimmer poly exchange   TOTAL KNEE ARTHROPLASTY Left 10/26/2023   Procedure: ARTHROPLASTY, KNEE, TOTAL;  Surgeon: Jerri Kay HERO, MD;  Location: [facility];  Service: Orthopedics;  Laterality: Left;   TOTAL KNEE ARTHROPLASTY Right 03/14/2024   Procedure: ARTHROPLASTY, KNEE, TOTAL;  Surgeon: Jerri Kay HERO, MD;  Location: [facility];  Service: Orthopedics;  Laterality: Right;   Patient Active Problem List   Diagnosis Date  Noted   Wound dehiscence 04/13/2024   Status post total right knee replacement 03/14/2024   Status post total left knee replacement 10/26/2023   Preop cardiovascular exam 08/11/2023   Obesity (BMI 30.0-34.9) 08/11/2023   Cardiac murmur 08/11/2023   Cigarette smoker 08/11/2023   Prominent abdominal aortic pulsation 08/11/2023   Hypertension    Nerve damage    Neuromuscular disorder (HCC)    Abnormal EKG    Primary osteoarthritis of right knee 06/13/2022   Paresthesia 03/13/2022   Chronic bilateral low back pain without sciatica 03/13/2022   Hyperlipidemia 10/17/2021   Prediabetes 10/17/2021   Essential (primary) hypertension 09/26/2021   Neuropathy    Osteoarthritis 11/03/2011    PCP: Jaycee Greig PARAS, NP   REFERRING PROVIDER: Jule Ronal CROME, PA-C  REFERRING DIAG: [phone] (ICD-10-CM) - Status post total right knee replacement   THERAPY DIAG:  Acute pain of right knee  Muscle weakness (generalized)  History of total right knee replacement  Rationale for Evaluation and Treatment: Rehabilitation  ONSET DATE: 03/14/24  SUBJECTIVE:   SUBJECTIVE STATEMENT: Minimal R knee pain, main restriction is flexion.  Has discontinued hinged brace and is using knee sleeve.    PERTINENT HISTORY: patient comes in today 9 weeks s/p right TKA and 5 weeks s/p right knee I&D and poly exchange.  He has been wearing a bledsoe brace locked in full extension.  Only  complaining of a dull ache and overall doing well.  Right knee exam shows a fully healed surgical scar.  No signs of infection or cellulitis.  Calf is soft and non-tender.  Today, I have unlocked the brace to full flexion. We will let PT help him wean out of the brace as he does not feel comfortable doing so at this time.  I sent in a new referral for outpatient PT to begin range of motion exercises.  He will follow-up with Dr. Jerri in 4 weeks for recheck.  Call with concerns or questions in the meantime.  PAIN:  Are you having pain? Yes:  NPRS scale: 2-6/10 Pain location: R knee Pain description: ache, sore Aggravating factors: bending, arising from sit Relieving factors: rest, sleeve  PRECAUTIONS: None  RED FLAGS: None   WEIGHT BEARING RESTRICTIONS: No  FALLS:  Has patient fallen in last 6 months? Yes. Number of falls 1  OCCUPATION: not working  PLOF: Independent  PATIENT GOALS: To regain my knee function  NEXT MD VISIT: TBD  OBJECTIVE:  Note: Objective measures were completed at Evaluation unless otherwise noted.  DIAGNOSTIC FINDINGS: X-rays of the right knee demonstrate a stable right total knee replacement  in good alignment   PATIENT SURVEYS:  LEFS 27/80  EDEMA:  Suprapatellar effusion observed  MUSCLE LENGTH: deferred  POSTURE: deferred  PALPATION: deferred  LOWER EXTREMITY ROM:  A/PROM Right eval Left eval  Hip flexion    Hip extension    Hip abduction    Hip adduction    Hip internal rotation    Hip external rotation    Knee flexion 90/90d   Knee extension -18/0d   Ankle dorsiflexion    Ankle plantarflexion    Ankle inversion    Ankle eversion     (Blank rows = not tested)  LOWER EXTREMITY MMT:  MMT Right eval Left eval  Hip flexion    Hip extension    Hip abduction    Hip adduction    Hip internal rotation    Hip external rotation    Knee flexion 4-   Knee extension 4-   Ankle dorsiflexion    Ankle plantarflexion    Ankle inversion    Ankle eversion     (Blank rows = not tested)  LOWER EXTREMITY SPECIAL TESTS:  deferred  FUNCTIONAL TESTS:  30 seconds chair stand test 0 reps  GAIT: Distance walked: 73ftx2 Assistive device utilized: None Level of assistance: Complete Independence Comments: Mild antalgia                                                                                                                                TREATMENT:  [facility] Adult PT Treatment:  DATE: 05/23/24 Eval and HEP Self  Care: Additional minutes spent for educating on updated Therapeutic Home Exercise Program as well as comparing current status to condition at start of symptoms. This included exercises focusing on stretching, strengthening, with focus on eccentric aspects. Long term goals include an improvement in range of motion, strength, endurance as well as avoiding reinjury. Patient's frequency would include in 1-2 times a day, 3-5 times a week for a duration of 6-12 weeks. Proper technique shown and discussed handout in great detail. All questions were discussed and addressed.     PATIENT EDUCATION:  Education details: Discussed eval findings, rehab rationale and POC and patient is in agreement  Person educated: Patient Education method: Explanation and Handouts Education comprehension: verbalized understanding and needs further education  HOME EXERCISE PROGRAM: Access Code: [email or web address] URL: [email or web address] Date: 05/23/2024 Prepared by: Reyes Kohut  Exercises - Small Range Straight Leg Raise  - 5 x daily - 5 x weekly - 1 sets - 10 reps - Supine Knee Extension Strengthening  - 5 x daily - 5 x weekly - 1 sets - 10 reps - Seated Knee Flexion Slide  - 5 x daily - 5 x weekly - 1 sets - 10 reps  ASSESSMENT:  CLINICAL IMPRESSION: Patient is a 58 y.o. male who was seen today for physical therapy evaluation and treatment for R knee weakness and loss of motion following TKA with liner replacement following traumatic fall post-op resulting in wound dehiscence.   OBJECTIVE IMPAIRMENTS: Abnormal gait, decreased activity tolerance, decreased balance, decreased endurance, decreased mobility, difficulty walking, decreased ROM, decreased strength, and pain.   ACTIVITY LIMITATIONS: carrying, lifting, standing, squatting, stairs, and transfers  PERSONAL FACTORS: Age, Fitness, and Past/current experiences are also affecting patient's functional outcome.   REHAB POTENTIAL: Good  CLINICAL  DECISION MAKING: Stable/uncomplicated  EVALUATION COMPLEXITY: Low   GOALS: Goals reviewed with patient? No  SHORT TERM GOALS: Target date: 06/13/2024    Patient to demonstrate independence in HEP  Baseline: [id number] Goal status: INITIAL  2.  100d R knee flexion Baseline: 90d Goal status: INITIAL  3.  1 STS w/o arms Baseline: 0 Goal status: INITIAL    LONG TERM GOALS: Target date: 07/19/2024    4/5 R knee strength F/E Baseline: 4-/5 in F/E Goal status: INITIAL  2.  Patient will increase 30s chair stand reps from 0 to 5 without arms to demonstrate and improved functional ability with less pain/difficulty as well as reduce fall risk.  Baseline: 0 Goal status: INITIAL  3.  Patient to increase AROM R knee to -5d extension and 115d flexion Baseline: -18d to 90d respectively Goal status: INITIAL  4.  Patient will score at least 40/80 on LEFS to signify clinically meaningful improvement in functional abilities.   Baseline: 27/80 Goal status: INITIAL  5.  Patient will acknowledge 4/10 pain at least once during episode of care   Baseline: 6/10 worst pain Goal status: INITIAL    PLAN:  PT FREQUENCY: 2x/week  PT DURATION: 6 weeks  PLANNED INTERVENTIONS: 97110-Therapeutic exercises, 97530- Therapeutic activity, 97112- Neuromuscular re-education, 97535- Self Care, [id number]- Manual therapy, [phone]- Gait training, Patient/Family education, Balance training, and Stair training  PLAN FOR NEXT SESSION: HEP review and update, manual techniques as appropriate, aerobic tasks, ROM and flexibility activities, strengthening and PREs, TPDN, gait and balance training,aquatic therapy, modalities for pain and NMRE    For all possible CPT codes, reference the Planned Interventions line above.     Check all conditions that are expected  to impact treatment: {Conditions expected to impact treatment:Complications related to surgery   If treatment provided at initial evaluation, no treatment  charged due to lack of authorization.        Khiana Camino M Josip Merolla, PT 05/24/2024, 2:13 PM

## 2024-05-25 ENCOUNTER — Other Ambulatory Visit: Payer: Self-pay | Admitting: Orthopaedic Surgery

## 2024-05-25 ENCOUNTER — Telehealth: Payer: Self-pay | Admitting: Orthopaedic Surgery

## 2024-05-25 MED ORDER — HYDROCODONE-ACETAMINOPHEN 5-325 MG PO TABS
1.0000 | ORAL_TABLET | Freq: Every day | ORAL | 0 refills | Status: AC | PRN
  Filled 2024-05-25: qty 10, 10d supply, fill #0

## 2024-05-25 NOTE — Telephone Encounter (Signed)
 Pt called and needs a refill on Oxycodone . CB#[phone]

## 2024-05-26 ENCOUNTER — Other Ambulatory Visit (HOSPITAL_COMMUNITY): Payer: Self-pay

## 2024-06-01 ENCOUNTER — Encounter: Payer: Self-pay | Admitting: Physical Therapy

## 2024-06-01 ENCOUNTER — Ambulatory Visit: Admitting: Physical Therapy

## 2024-06-01 DIAGNOSIS — Z96651 Presence of right artificial knee joint: Secondary | ICD-10-CM

## 2024-06-01 DIAGNOSIS — M25561 Pain in right knee: Secondary | ICD-10-CM | POA: Diagnosis not present

## 2024-06-01 DIAGNOSIS — M6281 Muscle weakness (generalized): Secondary | ICD-10-CM

## 2024-06-01 NOTE — Therapy (Signed)
 OUTPATIENT PHYSICAL THERAPY LOWER EXTREMITY EVALUATION   Patient Name: Justin Burnett MRN: [id number] DOB:[birth year], 58 y.o., male Today's Date: 06/01/2024  END OF SESSION:  PT End of Session - 06/01/24 0921     Visit Number 2    Number of Visits 12    Authorization Type MCD    PT Start Time 0920    PT Stop Time 1000    PT Time Calculation (min) 40 min    Activity Tolerance Patient tolerated treatment well    Behavior During Therapy [facility] for tasks assessed/performed           Past Medical History:  Diagnosis Date   Abnormal EKG    Chronic bilateral low back pain without sciatica 03/13/2022   Essential (primary) hypertension 09/26/2021   Hyperlipidemia 10/17/2021   Hypertension    Inflammatory bowel disease    by 12/28/2023 colonoscopy   Nerve damage    Neuropathy    Osteoarthritis 11/03/2011   Osteoarthritis of knees, bilateral 06/13/2022   Paresthesia 03/13/2022   Prediabetes 10/17/2021   Past Surgical History:  Procedure Laterality Date   APPLICATION OF WOUND VAC Right 04/14/2024   Procedure: APPLICATION, WOUND VAC;  Surgeon: Jerri Kay HERO, MD;  Location: [facility] OR;  Service: Orthopedics;  Laterality: Right;   FINGER SURGERY Left    As a child. Tip of middle finger cut off in wood shop   I & D KNEE WITH POLY EXCHANGE Right 04/14/2024   Procedure: IRRIGATION AND DEBRIDEMENT KNEE WITH POLY EXCHANGE;  Surgeon: Jerri Kay HERO, MD;  Location: [facility] OR;  Service: Orthopedics;  Laterality: Right;  irrigation and debridement with possible zimmer poly exchange   TOTAL KNEE ARTHROPLASTY Left 10/26/2023   Procedure: ARTHROPLASTY, KNEE, TOTAL;  Surgeon: Jerri Kay HERO, MD;  Location: [facility] OR;  Service: Orthopedics;  Laterality: Left;   TOTAL KNEE ARTHROPLASTY Right 03/14/2024   Procedure: ARTHROPLASTY, KNEE, TOTAL;  Surgeon: Jerri Kay HERO, MD;  Location: [facility] OR;  Service: Orthopedics;  Laterality: Right;   Patient Active Problem List   Diagnosis Date Noted   Wound dehiscence 04/13/2024    Status post total right knee replacement 03/14/2024   Status post total left knee replacement 10/26/2023   Preop cardiovascular exam 08/11/2023   Obesity (BMI 30.0-34.9) 08/11/2023   Cardiac murmur 08/11/2023   Cigarette smoker 08/11/2023   Prominent abdominal aortic pulsation 08/11/2023   Hypertension    Nerve damage    Neuromuscular disorder (HCC)    Abnormal EKG    Primary osteoarthritis of right knee 06/13/2022   Paresthesia 03/13/2022   Chronic bilateral low back pain without sciatica 03/13/2022   Hyperlipidemia 10/17/2021   Prediabetes 10/17/2021   Essential (primary) hypertension 09/26/2021   Neuropathy    Osteoarthritis 11/03/2011    PCP: Jaycee Greig PARAS, NP   REFERRING PROVIDER: Jule Ronal CROME, PA-C  REFERRING DIAG: [phone] (ICD-10-CM) - Status post total right knee replacement   THERAPY DIAG:  Acute pain of right knee  Muscle weakness (generalized)  History of total right knee replacement  Rationale for Evaluation and Treatment: Rehabilitation  ONSET DATE: 03/14/24  SUBJECTIVE:   SUBJECTIVE STATEMENT: My knee pain is about a 3-4/10. Feeling like more pressure pain  EVAL- Minimal R knee pain, main restriction is flexion.  Has discontinued hinged brace and is using knee sleeve.    PERTINENT HISTORY: patient comes in today 9 weeks s/p right TKA and 5 weeks s/p right knee I&D and poly exchange.  He has been wearing a bledsoe  brace locked in full extension.  Only complaining of a dull ache and overall doing well.  Right knee exam shows a fully healed surgical scar.  No signs of infection or cellulitis.  Calf is soft and non-tender.  Today, I have unlocked the brace to full flexion. We will let PT help him wean out of the brace as he does not feel comfortable doing so at this time.  I sent in a new referral for outpatient PT to begin range of motion exercises.  He will follow-up with Dr. Jerri in 4 weeks for recheck.  Call with concerns or questions in the meantime.   PAIN:  Are you having pain? Yes: NPRS scale: 2-6/10 Pain location: R knee Pain description: ache, sore Aggravating factors: bending, arising from sit Relieving factors: rest, sleeve  PRECAUTIONS: None  RED FLAGS: None   WEIGHT BEARING RESTRICTIONS: No  FALLS:  Has patient fallen in last 6 months? Yes. Number of falls 1  OCCUPATION: not working  PLOF: Independent  PATIENT GOALS: To regain my knee function  NEXT MD VISIT: TBD  OBJECTIVE:  Note: Objective measures were completed at Evaluation unless otherwise noted.  DIAGNOSTIC FINDINGS: X-rays of the right knee demonstrate a stable right total knee replacement  in good alignment   PATIENT SURVEYS:  LEFS 27/80  EDEMA:  Suprapatellar effusion observed  MUSCLE LENGTH: deferred  POSTURE: deferred  PALPATION: deferred  LOWER EXTREMITY ROM:  A/PROM Right eval Left eval Right 06-11-24  Hip flexion     Hip extension     Hip abduction     Hip adduction     Hip internal rotation     Hip external rotation     Knee flexion 90/90d  96 A  Knee extension -18/0d  -15 A  Ankle dorsiflexion     Ankle plantarflexion     Ankle inversion     Ankle eversion      (Blank rows = not tested)  LOWER EXTREMITY MMT:  MMT Right eval Left eval  Hip flexion    Hip extension    Hip abduction    Hip adduction    Hip internal rotation    Hip external rotation    Knee flexion 4-   Knee extension 4-   Ankle dorsiflexion    Ankle plantarflexion    Ankle inversion    Ankle eversion     (Blank rows = not tested)  LOWER EXTREMITY SPECIAL TESTS:  deferred  FUNCTIONAL TESTS:  30 seconds chair stand test 0 reps  GAIT: Distance walked: 36ftx2 Assistive device utilized: None Level of assistance: Complete Independence Comments: Mild antalgia                                                                                                                                TREATMENT:  [facility] Adult PT Treatment:  DATE: 06-01-24 Therapeutic Exercise: Sit to stand with UE support 1 x 5 and then 1 x 10 LAQ on high low table R  2 x 10 with DF of foot GTB resisted R knee flexion 2 x 10 GTB resisted  R knee extension 2 x 10 Small Range SLR  R  2 x10 reps Supine  R Knee  Quad set on towel roll on ankle  3 x 10 Supine  heel  Slide  R 3 x 10 Manual Therapy: Retrograde massage for edema at knee Extension mobs  Grade 2 manual for terminal extension   [facility] Adult PT Treatment:                                                DATE: 05/23/24 Eval and HEP Self Care: Additional minutes spent for educating on updated Therapeutic Home Exercise Program as well as comparing current status to condition at start of symptoms. This included exercises focusing on stretching, strengthening, with focus on eccentric aspects. Long term goals include an improvement in range of motion, strength, endurance as well as avoiding reinjury. Patient's frequency would include in 1-2 times a day, 3-5 times a week for a duration of 6-12 weeks. Proper technique shown and discussed handout in great detail. All questions were discussed and addressed.     PATIENT EDUCATION:  Education details: Discussed eval findings, rehab rationale and POC and patient is in agreement  Person educated: Patient Education method: Explanation and Handouts Education comprehension: verbalized understanding and needs further education  HOME EXERCISE PROGRAM: Access Code: [email or web address] URL: [email or web address] Date: 05/23/2024 Prepared by: Reyes Kohut  Exercises - Small Range Straight Leg Raise  - 5 x daily - 5 x weekly - 1 sets - 10 reps - Supine Knee Extension Strengthening  - 5 x daily - 5 x weekly - 1 sets - 10 reps - Seated Knee Flexion Slide  - 5 x daily - 5 x weekly - 1 sets - 10 reps  ASSESSMENT:  CLINICAL IMPRESSION: Pt returns to clinic with 3-4/10 pain     EVAL-Patient is a 58 y.o. male who was  seen today for physical therapy evaluation and treatment for R knee weakness and loss of motion following TKA with liner replacement following traumatic fall post-op resulting in wound dehiscence.   OBJECTIVE IMPAIRMENTS: Abnormal gait, decreased activity tolerance, decreased balance, decreased endurance, decreased mobility, difficulty walking, decreased ROM, decreased strength, and pain.   ACTIVITY LIMITATIONS: carrying, lifting, standing, squatting, stairs, and transfers  PERSONAL FACTORS: Age, Fitness, and Past/current experiences are also affecting patient's functional outcome.   REHAB POTENTIAL: Good  CLINICAL DECISION MAKING: Stable/uncomplicated  EVALUATION COMPLEXITY: Low   GOALS: Goals reviewed with patient? No  SHORT TERM GOALS: Target date: 06/13/2024    Patient to demonstrate independence in HEP  Baseline: [id number] Goal status: INITIAL  2.  100d R knee flexion Baseline: 90d Goal status: INITIAL  3.  1 STS w/o arms Baseline: 0 Goal status: INITIAL    LONG TERM GOALS: Target date: 07/19/2024    4/5 R knee strength F/E Baseline: 4-/5 in F/E Goal status: INITIAL  2.  Patient will increase 30s chair stand reps from 0 to 5 without arms to demonstrate and improved functional ability with less pain/difficulty as well as reduce fall risk.  Baseline: 0 Goal status: INITIAL  3.  Patient to increase  AROM R knee to -5d extension and 115d flexion Baseline: -18d to 90d respectively Goal status: INITIAL  4.  Patient will score at least 40/80 on LEFS to signify clinically meaningful improvement in functional abilities.   Baseline: 27/80 Goal status: INITIAL  5.  Patient will acknowledge 4/10 pain at least once during episode of care   Baseline: 6/10 worst pain Goal status: INITIAL    PLAN:  PT FREQUENCY: 2x/week  PT DURATION: 6 weeks  PLANNED INTERVENTIONS: 97110-Therapeutic exercises, 97530- Therapeutic activity, 97112- Neuromuscular re-education, 97535-  Self Care, 02859- Manual therapy, [phone]- Gait training, Patient/Family education, Balance training, and Stair training  PLAN FOR NEXT SESSION: HEP review and update, manual techniques as appropriate, aerobic tasks, ROM and flexibility activities, strengthening and PREs, TPDN, gait and balance training,aquatic therapy, modalities for pain and NMRE    For all possible CPT codes, reference the Planned Interventions line above.     Check all conditions that are expected to impact treatment: {Conditions expected to impact treatment:Complications related to surgery   If treatment provided at initial evaluation, no treatment charged due to lack of authorization.       Graydon Dingwall, PT, [facility] Certified Exercise Expert for the Aging Adult  06/01/2024 12:43 PM Phone: [phone] Fax: [phone]

## 2024-06-02 ENCOUNTER — Encounter: Payer: Self-pay | Admitting: Internal Medicine

## 2024-06-07 ENCOUNTER — Ambulatory Visit

## 2024-06-07 DIAGNOSIS — Z96651 Presence of right artificial knee joint: Secondary | ICD-10-CM

## 2024-06-07 DIAGNOSIS — M6281 Muscle weakness (generalized): Secondary | ICD-10-CM

## 2024-06-07 DIAGNOSIS — M25561 Pain in right knee: Secondary | ICD-10-CM | POA: Diagnosis not present

## 2024-06-07 NOTE — Therapy (Signed)
 " OUTPATIENT PHYSICAL THERAPY LOWER EXTREMITY EVALUATION   Patient Name: Justin Burnett MRN: [id number] DOB:02/02/1966, 58 y.o., male Today's Date: 06/07/2024  END OF SESSION:  PT End of Session - 06/07/24 1133     Visit Number 3    Number of Visits 12    Date for Recertification  07/24/24    Authorization Type MCD    PT Start Time 1133    PT Stop Time 1213    PT Time Calculation (min) 40 min    Activity Tolerance Patient tolerated treatment well    Behavior During Therapy [facility] for tasks assessed/performed           Past Medical History:  Diagnosis Date   Abnormal EKG    Chronic bilateral low back pain without sciatica 03/13/2022   Essential (primary) hypertension 09/26/2021   Hyperlipidemia 10/17/2021   Hypertension    Inflammatory bowel disease    by 12/28/2023 colonoscopy   Nerve damage    Neuropathy    Osteoarthritis 11/03/2011   Osteoarthritis of knees, bilateral 06/13/2022   Paresthesia 03/13/2022   Prediabetes 10/17/2021   Past Surgical History:  Procedure Laterality Date   APPLICATION OF WOUND VAC Right 04/14/2024   Procedure: APPLICATION, WOUND VAC;  Surgeon: Jerri Kay HERO, MD;  Location: [facility] OR;  Service: Orthopedics;  Laterality: Right;   FINGER SURGERY Left    As a child. Tip of middle finger cut off in wood shop   I & D KNEE WITH POLY EXCHANGE Right 04/14/2024   Procedure: IRRIGATION AND DEBRIDEMENT KNEE WITH POLY EXCHANGE;  Surgeon: Jerri Kay HERO, MD;  Location: [facility] OR;  Service: Orthopedics;  Laterality: Right;  irrigation and debridement with possible zimmer poly exchange   TOTAL KNEE ARTHROPLASTY Left 10/26/2023   Procedure: ARTHROPLASTY, KNEE, TOTAL;  Surgeon: Jerri Kay HERO, MD;  Location: [facility] OR;  Service: Orthopedics;  Laterality: Left;   TOTAL KNEE ARTHROPLASTY Right 03/14/2024   Procedure: ARTHROPLASTY, KNEE, TOTAL;  Surgeon: Jerri Kay HERO, MD;  Location: [facility] OR;  Service: Orthopedics;  Laterality: Right;   Patient Active Problem List   Diagnosis  Date Noted   Wound dehiscence 04/13/2024   Status post total right knee replacement 03/14/2024   Status post total left knee replacement 10/26/2023   Preop cardiovascular exam 08/11/2023   Obesity (BMI 30.0-34.9) 08/11/2023   Cardiac murmur 08/11/2023   Cigarette smoker 08/11/2023   Prominent abdominal aortic pulsation 08/11/2023   Hypertension    Nerve damage    Neuromuscular disorder (HCC)    Abnormal EKG    Primary osteoarthritis of right knee 06/13/2022   Paresthesia 03/13/2022   Chronic bilateral low back pain without sciatica 03/13/2022   Hyperlipidemia 10/17/2021   Prediabetes 10/17/2021   Essential (primary) hypertension 09/26/2021   Neuropathy    Osteoarthritis 11/03/2011    PCP: Jaycee Greig PARAS, NP   REFERRING PROVIDER: Jule Ronal CROME, PA-C  REFERRING DIAG: [phone] (ICD-10-CM) - Status post total right knee replacement   THERAPY DIAG:  Acute pain of right knee  Muscle weakness (generalized)  History of total right knee replacement  Rationale for Evaluation and Treatment: Rehabilitation  ONSET DATE: 03/14/24  SUBJECTIVE:   SUBJECTIVE STATEMENT: 06/07/2024 Pt reporting mild pain when straightening the knee. He feels unstable on the knee which is why he wears the sleeve.    EVAL- Minimal R knee pain, main restriction is flexion.  Has discontinued hinged brace and is using knee sleeve.    PERTINENT HISTORY: patient comes in today 34  weeks s/p right TKA and 5 weeks s/p right knee I&D and poly exchange.  He has been wearing a bledsoe brace locked in full extension.  Only complaining of a dull ache and overall doing well.  Right knee exam shows a fully healed surgical scar.  No signs of infection or cellulitis.  Calf is soft and non-tender.  Today, I have unlocked the brace to full flexion. We will let PT help him wean out of the brace as he does not feel comfortable doing so at this time.  I sent in a new referral for outpatient PT to begin range of motion  exercises.  He will follow-up with Dr. Jerri in 4 weeks for recheck.  Call with concerns or questions in the meantime.  PAIN:  Are you having pain? Yes: NPRS scale: 2-6/10 Pain location: R knee Pain description: ache, sore Aggravating factors: bending, arising from sit Relieving factors: rest, sleeve  PRECAUTIONS: None  RED FLAGS: None   WEIGHT BEARING RESTRICTIONS: No  FALLS:  Has patient fallen in last 6 months? Yes. Number of falls 1  OCCUPATION: not working  PLOF: Independent  PATIENT GOALS: To regain my knee function  NEXT MD VISIT: TBD  OBJECTIVE:  Note: Objective measures were completed at Evaluation unless otherwise noted.  DIAGNOSTIC FINDINGS: X-rays of the right knee demonstrate a stable right total knee replacement  in good alignment   PATIENT SURVEYS:  LEFS 27/80  EDEMA:  Suprapatellar effusion observed  MUSCLE LENGTH: deferred  POSTURE: deferred  PALPATION: deferred  LOWER EXTREMITY ROM:  A/PROM Right eval Left eval Right 06-11-24  Hip flexion     Hip extension     Hip abduction     Hip adduction     Hip internal rotation     Hip external rotation     Knee flexion 90/90d  96 A  Knee extension -18/0d  -15 A  Ankle dorsiflexion     Ankle plantarflexion     Ankle inversion     Ankle eversion      (Blank rows = not tested)  LOWER EXTREMITY MMT:  MMT Right eval Left eval  Hip flexion    Hip extension    Hip abduction    Hip adduction    Hip internal rotation    Hip external rotation    Knee flexion 4-   Knee extension 4-   Ankle dorsiflexion    Ankle plantarflexion    Ankle inversion    Ankle eversion     (Blank rows = not tested)  LOWER EXTREMITY SPECIAL TESTS:  deferred  FUNCTIONAL TESTS:  30 seconds chair stand test 0 reps  GAIT: Distance walked: 5ftx2 Assistive device utilized: None Level of assistance: Complete Independence Comments: Mild antalgia                                                                                                                                 TREATMENT:  [facility] Adult PT Treatment:                                                DATE: 06-07-24 Therapeutic Exercise: Sit to stand without UE support 2 x 10  LAQ on high low table R  2 x 10 with DF of foot GTB resisted R knee flexion 2 x 10 GTB resisted  R knee extension 2 x 10 (NT) Supine bridge with knees extended on foam roller 2 x 10  Seated AAROM knee flexion with opposite LE assist 2x10x5 hold  Small Range SLR  R  2 x10 reps (NT) Supine  R Knee  Quad set on towel roll on ankle  3 x 10 Supine DKTC with ball x 20  Supine  heel  Slide  R 3 x 10 (NT)   [facility] Adult PT Treatment:                                                DATE: 05/23/24 Eval and HEP Self Care: Additional minutes spent for educating on updated Therapeutic Home Exercise Program as well as comparing current status to condition at start of symptoms. This included exercises focusing on stretching, strengthening, with focus on eccentric aspects. Long term goals include an improvement in range of motion, strength, endurance as well as avoiding reinjury. Patient's frequency would include in 1-2 times a day, 3-5 times a week for a duration of 6-12 weeks. Proper technique shown and discussed handout in great detail. All questions were discussed and addressed.     PATIENT EDUCATION:  Education details: Discussed eval findings, rehab rationale and POC and patient is in agreement  Person educated: Patient Education method: Explanation and Handouts Education comprehension: verbalized understanding and needs further education  HOME EXERCISE PROGRAM: Access Code: [email or web address] URL: [email or web address] Date: 05/23/2024 Prepared by: Reyes Kohut  Exercises - Small Range Straight Leg Raise  - 5 x daily - 5 x weekly - 1 sets - 10 reps - Supine Knee Extension Strengthening  - 5 x daily - 5 x weekly - 1 sets - 10 reps - Seated Knee Flexion Slide  - 5 x  daily - 5 x weekly - 1 sets - 10 reps  ASSESSMENT:  CLINICAL IMPRESSION: 06/07/2024  Pt continues to have extension and flexion limitations which were addressed with ROM and strengthening exercises. Pt noting pain with quad sets but not knees extended bridges. Exited with mild soreness of the hamstrings. The pt will benefit from skilled physical therapy to decrease pain and increase function.    EVAL-Patient is a 58 y.o. male who was seen today for physical therapy evaluation and treatment for R knee weakness and loss of motion following TKA with liner replacement following traumatic fall post-op resulting in wound dehiscence.   OBJECTIVE IMPAIRMENTS: Abnormal gait, decreased activity tolerance, decreased balance, decreased endurance, decreased mobility, difficulty walking, decreased ROM, decreased strength, and pain.   ACTIVITY LIMITATIONS: carrying, lifting, standing, squatting, stairs, and transfers  PERSONAL FACTORS: Age, Fitness, and Past/current experiences are also affecting patient's functional outcome.   REHAB POTENTIAL: Good  CLINICAL DECISION MAKING: Stable/uncomplicated  EVALUATION COMPLEXITY: Low   GOALS: Goals reviewed with patient? No  SHORT TERM GOALS: Target date: 06/13/2024  Patient to demonstrate independence in HEP  Baseline: [id number] Goal status: INITIAL  2.  100d R knee flexion Baseline: 90d Goal status: INITIAL  3.  1 STS w/o arms Baseline: 0 Goal status: INITIAL    LONG TERM GOALS: Target date: 07/19/2024    4/5 R knee strength F/E Baseline: 4-/5 in F/E Goal status: INITIAL  2.  Patient will increase 30s chair stand reps from 0 to 5 without arms to demonstrate and improved functional ability with less pain/difficulty as well as reduce fall risk.  Baseline: 0 Goal status: INITIAL  3.  Patient to increase AROM R knee to -5d extension and 115d flexion Baseline: -18d to 90d respectively Goal status: INITIAL  4.  Patient will score at least  40/80 on LEFS to signify clinically meaningful improvement in functional abilities.   Baseline: 27/80 Goal status: INITIAL  5.  Patient will acknowledge 4/10 pain at least once during episode of care   Baseline: 6/10 worst pain Goal status: INITIAL    PLAN:  PT FREQUENCY: 2x/week  PT DURATION: 6 weeks  PLANNED INTERVENTIONS: 97110-Therapeutic exercises, 97530- Therapeutic activity, 97112- Neuromuscular re-education, 97535- Self Care, 02859- Manual therapy, [phone]- Gait training, Patient/Family education, Balance training, and Stair training  PLAN FOR NEXT SESSION: HEP review and update, manual techniques as appropriate, aerobic tasks, ROM and flexibility activities, strengthening and PREs, TPDN, gait and balance training,aquatic therapy, modalities for pain and NMRE    For all possible CPT codes, reference the Planned Interventions line above.     Check all conditions that are expected to impact treatment: {Conditions expected to impact treatment:Complications related to surgery   If treatment provided at initial evaluation, no treatment charged due to lack of authorization.       Marijo Berber PT, DPT 06/07/2024 11:55 AM  "

## 2024-06-08 ENCOUNTER — Ambulatory Visit

## 2024-06-13 ENCOUNTER — Ambulatory Visit

## 2024-06-13 DIAGNOSIS — Z96651 Presence of right artificial knee joint: Secondary | ICD-10-CM

## 2024-06-13 DIAGNOSIS — M25561 Pain in right knee: Secondary | ICD-10-CM

## 2024-06-13 DIAGNOSIS — M6281 Muscle weakness (generalized): Secondary | ICD-10-CM

## 2024-06-13 NOTE — Therapy (Signed)
 " OUTPATIENT PHYSICAL THERAPY LOWER EXTREMITY EVALUATION   Patient Name: Justin Burnett MRN: [id number] DOB:1965-11-16, 58 y.o., male Today's Date: 06/13/2024  END OF SESSION:  PT End of Session - 06/13/24 1129     Visit Number 4    Number of Visits 12    Date for Recertification  07/24/24    Authorization Type MCD    Authorization Time Period Approved 13 visits 10/30/23-01/28/24    Authorization - Number of Visits 13    PT Start Time 1130    PT Stop Time 1210    PT Time Calculation (min) 40 min    Activity Tolerance Patient tolerated treatment well    Behavior During Therapy [facility] for tasks assessed/performed            Past Medical History:  Diagnosis Date   Abnormal EKG    Chronic bilateral low back pain without sciatica 03/13/2022   Essential (primary) hypertension 09/26/2021   Hyperlipidemia 10/17/2021   Hypertension    Inflammatory bowel disease    by 12/28/2023 colonoscopy   Nerve damage    Neuropathy    Osteoarthritis 11/03/2011   Osteoarthritis of knees, bilateral 06/13/2022   Paresthesia 03/13/2022   Prediabetes 10/17/2021   Past Surgical History:  Procedure Laterality Date   APPLICATION OF WOUND VAC Right 04/14/2024   Procedure: APPLICATION, WOUND VAC;  Surgeon: Jerri Kay HERO, MD;  Location: [facility] OR;  Service: Orthopedics;  Laterality: Right;   FINGER SURGERY Left    As a child. Tip of middle finger cut off in wood shop   I & D KNEE WITH POLY EXCHANGE Right 04/14/2024   Procedure: IRRIGATION AND DEBRIDEMENT KNEE WITH POLY EXCHANGE;  Surgeon: Jerri Kay HERO, MD;  Location: [facility] OR;  Service: Orthopedics;  Laterality: Right;  irrigation and debridement with possible zimmer poly exchange   TOTAL KNEE ARTHROPLASTY Left 10/26/2023   Procedure: ARTHROPLASTY, KNEE, TOTAL;  Surgeon: Jerri Kay HERO, MD;  Location: [facility] OR;  Service: Orthopedics;  Laterality: Left;   TOTAL KNEE ARTHROPLASTY Right 03/14/2024   Procedure: ARTHROPLASTY, KNEE, TOTAL;  Surgeon: Jerri Kay HERO, MD;   Location: [facility] OR;  Service: Orthopedics;  Laterality: Right;   Patient Active Problem List   Diagnosis Date Noted   Wound dehiscence 04/13/2024   Status post total right knee replacement 03/14/2024   Status post total left knee replacement 10/26/2023   Preop cardiovascular exam 08/11/2023   Obesity (BMI 30.0-34.9) 08/11/2023   Cardiac murmur 08/11/2023   Cigarette smoker 08/11/2023   Prominent abdominal aortic pulsation 08/11/2023   Hypertension    Nerve damage    Neuromuscular disorder (HCC)    Abnormal EKG    Primary osteoarthritis of right knee 06/13/2022   Paresthesia 03/13/2022   Chronic bilateral low back pain without sciatica 03/13/2022   Hyperlipidemia 10/17/2021   Prediabetes 10/17/2021   Essential (primary) hypertension 09/26/2021   Neuropathy    Osteoarthritis 11/03/2011    PCP: Jaycee Greig PARAS, NP   REFERRING PROVIDER: Jule Ronal CROME, PA-C  REFERRING DIAG: [phone] (ICD-10-CM) - Status post total right knee replacement   THERAPY DIAG:  Acute pain of right knee  Muscle weakness (generalized)  History of total right knee replacement  Rationale for Evaluation and Treatment: Rehabilitation  ONSET DATE: 03/14/24  SUBJECTIVE:   SUBJECTIVE STATEMENT: 06/13/2024 Pt reporting 2-3/10 pain today. Notes   EVAL- Minimal R knee pain, main restriction is flexion.  Has discontinued hinged brace and is using knee sleeve.    PERTINENT HISTORY: patient  comes in today 9 weeks s/p right TKA and 5 weeks s/p right knee I&D and poly exchange.  He has been wearing a bledsoe brace locked in full extension.  Only complaining of a dull ache and overall doing well.  Right knee exam shows a fully healed surgical scar.  No signs of infection or cellulitis.  Calf is soft and non-tender.  Today, I have unlocked the brace to full flexion. We will let PT help him wean out of the brace as he does not feel comfortable doing so at this time.  I sent in a new referral for outpatient PT to  begin range of motion exercises.  He will follow-up with Dr. Jerri in 4 weeks for recheck.  Call with concerns or questions in the meantime.  PAIN:  Are you having pain? Yes: NPRS scale: 2-6/10 Pain location: R knee Pain description: ache, sore Aggravating factors: bending, arising from sit Relieving factors: rest, sleeve  PRECAUTIONS: None  RED FLAGS: None   WEIGHT BEARING RESTRICTIONS: No  FALLS:  Has patient fallen in last 6 months? Yes. Number of falls 1  OCCUPATION: not working  PLOF: Independent  PATIENT GOALS: To regain my knee function  NEXT MD VISIT: TBD  OBJECTIVE:  Note: Objective measures were completed at Evaluation unless otherwise noted.  DIAGNOSTIC FINDINGS: X-rays of the right knee demonstrate a stable right total knee replacement  in good alignment   PATIENT SURVEYS:  LEFS 27/80  EDEMA:  Suprapatellar effusion observed  MUSCLE LENGTH: deferred  POSTURE: deferred  PALPATION: deferred  LOWER EXTREMITY ROM:  A/PROM Right eval Left eval Right 06-11-24  Hip flexion     Hip extension     Hip abduction     Hip adduction     Hip internal rotation     Hip external rotation     Knee flexion 90/90d  96 A  Knee extension -18/0d  -15 A  Ankle dorsiflexion     Ankle plantarflexion     Ankle inversion     Ankle eversion      (Blank rows = not tested)  LOWER EXTREMITY MMT:  MMT Right eval Left eval  Hip flexion    Hip extension    Hip abduction    Hip adduction    Hip internal rotation    Hip external rotation    Knee flexion 4-   Knee extension 4-   Ankle dorsiflexion    Ankle plantarflexion    Ankle inversion    Ankle eversion     (Blank rows = not tested)  LOWER EXTREMITY SPECIAL TESTS:  deferred  FUNCTIONAL TESTS:  30 seconds chair stand test 0 reps  GAIT: Distance walked: 83ftx2 Assistive device utilized: None Level of assistance: Complete Independence Comments: Mild antalgia  TREATMENT:  [facility] Adult PT Treatment:                                                DATE: 06-07-24 Therapeutic Exercise: NuStep LE only level 4 x 6 mins Sit to stand without UE support 2 x 10  LAQ on high low table R  2 x 10 with DF of foot GTB resisted R knee flexion 2 x 10 BTB resisted  R knee extension 2 x 10 Supine bridge with knees extended on foam roller 2 x 10  Seated AAROM knee flexion with opposite LE assist 2x10x5 hold  Supine  R Knee  Quad set on towel roll on ankle  3 x 10 (NT) Supine DKTC with ball x 20 (NT) Supine  heel  Slide  R 3 x 10  Neuro Re-Ed:  Heel raises in // bars 3x10 Heel walking in // bars x 4 laps Tandem walking in // bars x 2 laps    [facility] Adult PT Treatment:                                                DATE: 05/23/24 Eval and HEP Self Care: Additional minutes spent for educating on updated Therapeutic Home Exercise Program as well as comparing current status to condition at start of symptoms. This included exercises focusing on stretching, strengthening, with focus on eccentric aspects. Long term goals include an improvement in range of motion, strength, endurance as well as avoiding reinjury. Patient's frequency would include in 1-2 times a day, 3-5 times a week for a duration of 6-12 weeks. Proper technique shown and discussed handout in great detail. All questions were discussed and addressed.     PATIENT EDUCATION:  Education details: Discussed eval findings, rehab rationale and POC and patient is in agreement  Person educated: Patient Education method: Explanation and Handouts Education comprehension: verbalized understanding and needs further education  HOME EXERCISE PROGRAM: Access Code: [email or web address] URL: [email or web address] Date: 05/23/2024 Prepared by: Reyes Kohut  Exercises - Small Range Straight Leg Raise  - 5 x daily - 5 x weekly - 1  sets - 10 reps - Supine Knee Extension Strengthening  - 5 x daily - 5 x weekly - 1 sets - 10 reps - Seated Knee Flexion Slide  - 5 x daily - 5 x weekly - 1 sets - 10 reps  ASSESSMENT:  CLINICAL IMPRESSION: 06/13/2024  Pt completed all exercises without increase in pain. He was able to achieve greater knee extension ROM after hamstring strengthening. Pt challenged with balance activities today.   The pt will benefit from skilled physical therapy to decrease pain and increase function.    EVAL-Patient is a 58 y.o. male who was seen today for physical therapy evaluation and treatment for R knee weakness and loss of motion following TKA with liner replacement following traumatic fall post-op resulting in wound dehiscence.   OBJECTIVE IMPAIRMENTS: Abnormal gait, decreased activity tolerance, decreased balance, decreased endurance, decreased mobility, difficulty walking, decreased ROM, decreased strength, and pain.   ACTIVITY LIMITATIONS: carrying, lifting, standing, squatting, stairs, and transfers  PERSONAL FACTORS: Age, Fitness, and Past/current experiences are also affecting patient's functional outcome.   REHAB POTENTIAL: Good  CLINICAL DECISION MAKING: Stable/uncomplicated  EVALUATION COMPLEXITY: Low   GOALS: Goals reviewed with patient? No  SHORT TERM GOALS: Target date: 06/13/2024    Patient to demonstrate independence in HEP  Baseline: [id number] Goal status: INITIAL  2.  100d R knee flexion Baseline: 90d Goal status: INITIAL  3.  1 STS w/o arms Baseline: 0 Goal status: INITIAL    LONG TERM GOALS: Target date: 07/19/2024    4/5 R knee strength F/E Baseline: 4-/5 in F/E Goal status: INITIAL  2.  Patient will increase 30s chair stand reps from 0 to 5 without arms to demonstrate and improved functional ability with less pain/difficulty as well as reduce fall risk.  Baseline: 0 Goal status: INITIAL  3.  Patient to increase AROM R knee to -5d extension and 115d  flexion Baseline: -18d to 90d respectively Goal status: INITIAL  4.  Patient will score at least 40/80 on LEFS to signify clinically meaningful improvement in functional abilities.   Baseline: 27/80 Goal status: INITIAL  5.  Patient will acknowledge 4/10 pain at least once during episode of care   Baseline: 6/10 worst pain Goal status: INITIAL    PLAN:  PT FREQUENCY: 2x/week  PT DURATION: 6 weeks  PLANNED INTERVENTIONS: 97110-Therapeutic exercises, 97530- Therapeutic activity, 97112- Neuromuscular re-education, 97535- Self Care, 02859- Manual therapy, [phone]- Gait training, Patient/Family education, Balance training, and Stair training  PLAN FOR NEXT SESSION: HEP review and update, manual techniques as appropriate, aerobic tasks, ROM and flexibility activities, strengthening and PREs, TPDN, gait and balance training,aquatic therapy, modalities for pain and NMRE    For all possible CPT codes, reference the Planned Interventions line above.     Check all conditions that are expected to impact treatment: {Conditions expected to impact treatment:Complications related to surgery   If treatment provided at initial evaluation, no treatment charged due to lack of authorization.       Marijo Berber PT, DPT 06/13/2024 12:14 PM  "

## 2024-06-15 ENCOUNTER — Ambulatory Visit

## 2024-06-15 DIAGNOSIS — M6281 Muscle weakness (generalized): Secondary | ICD-10-CM

## 2024-06-15 DIAGNOSIS — M25561 Pain in right knee: Secondary | ICD-10-CM

## 2024-06-15 DIAGNOSIS — Z96651 Presence of right artificial knee joint: Secondary | ICD-10-CM

## 2024-06-15 NOTE — Therapy (Signed)
 " OUTPATIENT PHYSICAL THERAPY NOTE   Patient Name: Justin Burnett MRN: [id number] DOB:19-Jul-1965, 58 y.o., male Today's Date: 06/15/2024  END OF SESSION:  PT End of Session - 06/15/24 1135     Visit Number 5    Number of Visits 12    Date for Recertification  07/24/24    Authorization Type MCD    Authorization Time Period Approved 13 visits 10/30/23-01/28/24    Authorization - Visit Number 12    Authorization - Number of Visits 13    PT Start Time 1135    PT Stop Time 1213    PT Time Calculation (min) 38 min    Activity Tolerance Patient tolerated treatment well    Behavior During Therapy [facility] for tasks assessed/performed             Past Medical History:  Diagnosis Date   Abnormal EKG    Chronic bilateral low back pain without sciatica 03/13/2022   Essential (primary) hypertension 09/26/2021   Hyperlipidemia 10/17/2021   Hypertension    Inflammatory bowel disease    by 12/28/2023 colonoscopy   Nerve damage    Neuropathy    Osteoarthritis 11/03/2011   Osteoarthritis of knees, bilateral 06/13/2022   Paresthesia 03/13/2022   Prediabetes 10/17/2021   Past Surgical History:  Procedure Laterality Date   APPLICATION OF WOUND VAC Right 04/14/2024   Procedure: APPLICATION, WOUND VAC;  Surgeon: Jerri Kay HERO, MD;  Location: [facility] OR;  Service: Orthopedics;  Laterality: Right;   FINGER SURGERY Left    As a child. Tip of middle finger cut off in wood shop   I & D KNEE WITH POLY EXCHANGE Right 04/14/2024   Procedure: IRRIGATION AND DEBRIDEMENT KNEE WITH POLY EXCHANGE;  Surgeon: Jerri Kay HERO, MD;  Location: [facility] OR;  Service: Orthopedics;  Laterality: Right;  irrigation and debridement with possible zimmer poly exchange   TOTAL KNEE ARTHROPLASTY Left 10/26/2023   Procedure: ARTHROPLASTY, KNEE, TOTAL;  Surgeon: Jerri Kay HERO, MD;  Location: [facility] OR;  Service: Orthopedics;  Laterality: Left;   TOTAL KNEE ARTHROPLASTY Right 03/14/2024   Procedure: ARTHROPLASTY, KNEE, TOTAL;  Surgeon:  Jerri Kay HERO, MD;  Location: [facility] OR;  Service: Orthopedics;  Laterality: Right;   Patient Active Problem List   Diagnosis Date Noted   Wound dehiscence 04/13/2024   Status post total right knee replacement 03/14/2024   Status post total left knee replacement 10/26/2023   Preop cardiovascular exam 08/11/2023   Obesity (BMI 30.0-34.9) 08/11/2023   Cardiac murmur 08/11/2023   Cigarette smoker 08/11/2023   Prominent abdominal aortic pulsation 08/11/2023   Hypertension    Nerve damage    Neuromuscular disorder (HCC)    Abnormal EKG    Primary osteoarthritis of right knee 06/13/2022   Paresthesia 03/13/2022   Chronic bilateral low back pain without sciatica 03/13/2022   Hyperlipidemia 10/17/2021   Prediabetes 10/17/2021   Essential (primary) hypertension 09/26/2021   Neuropathy    Osteoarthritis 11/03/2011    PCP: Jaycee Greig PARAS, NP   REFERRING PROVIDER: Jule Ronal CROME, PA-C  REFERRING DIAG: [phone] (ICD-10-CM) - Status post total right knee replacement   THERAPY DIAG:  Acute pain of right knee  Muscle weakness (generalized)  History of total right knee replacement  Rationale for Evaluation and Treatment: Rehabilitation  ONSET DATE: 03/14/24  SUBJECTIVE:   SUBJECTIVE STATEMENT: 06/15/2024 Patient reporting minimal to no pain at start of today's session.   EVAL- Minimal R knee pain, main restriction is flexion.  Has discontinued hinged  brace and is using knee sleeve.    PERTINENT HISTORY: patient comes in today 9 weeks s/p right TKA and 5 weeks s/p right knee I&D and poly exchange.  He has been wearing a bledsoe brace locked in full extension.  Only complaining of a dull ache and overall doing well.  Right knee exam shows a fully healed surgical scar.  No signs of infection or cellulitis.  Calf is soft and non-tender.  Today, I have unlocked the brace to full flexion. We will let PT help him wean out of the brace as he does not feel comfortable doing so at this time.  I  sent in a new referral for outpatient PT to begin range of motion exercises.  He will follow-up with Dr. Jerri in 4 weeks for recheck.  Call with concerns or questions in the meantime.  PAIN:  Are you having pain? Yes: NPRS scale: 2-6/10 Pain location: R knee Pain description: ache, sore Aggravating factors: bending, arising from sit Relieving factors: rest, sleeve  PRECAUTIONS: None  RED FLAGS: None   WEIGHT BEARING RESTRICTIONS: No  FALLS:  Has patient fallen in last 6 months? Yes. Number of falls 1  OCCUPATION: not working  PLOF: Independent  PATIENT GOALS: To regain my knee function  NEXT MD VISIT: TBD  OBJECTIVE:  Note: Objective measures were completed at Evaluation unless otherwise noted.  DIAGNOSTIC FINDINGS: X-rays of the right knee demonstrate a stable right total knee replacement  in good alignment   PATIENT SURVEYS:  LEFS 27/80  EDEMA:  Suprapatellar effusion observed  MUSCLE LENGTH: deferred  POSTURE: deferred  PALPATION: deferred  LOWER EXTREMITY ROM:  A/PROM Right eval Left eval Right 06-11-24  Hip flexion     Hip extension     Hip abduction     Hip adduction     Hip internal rotation     Hip external rotation     Knee flexion 90/90d  96 A  Knee extension -18/0d  -15 A  Ankle dorsiflexion     Ankle plantarflexion     Ankle inversion     Ankle eversion      (Blank rows = not tested)  LOWER EXTREMITY MMT:  MMT Right eval Left eval  Hip flexion    Hip extension    Hip abduction    Hip adduction    Hip internal rotation    Hip external rotation    Knee flexion 4-   Knee extension 4-   Ankle dorsiflexion    Ankle plantarflexion    Ankle inversion    Ankle eversion     (Blank rows = not tested)  LOWER EXTREMITY SPECIAL TESTS:  deferred  FUNCTIONAL TESTS:  30 seconds chair stand test 0 reps  GAIT: Distance walked: 56ftx2 Assistive device utilized: None Level of assistance: Complete Independence Comments: Mild  antalgia  TREATMENT:   [facility] Adult PT Treatment:                                                DATE: 06/15/2024  Therapeutic Exercise: NuStep LE only level 4 x 6 mins Sit to stand without UE support 2 x 10  LAQ on high low table R  2 x 10 with DF of foot GTB resisted R knee flexion 2 x 10 Supine DKTC with ball x 20   Neuro Re-Ed:  Supine bridge with calf on ball 2 x 10  Supine R Knee Quad set on towel roll on ankle 3 x 10  Heel raises at counter 3x10 Heel walking at counter x 2 laps Tandem walking at counter  x 2 laps   [facility] Adult PT Treatment:                                                DATE: 06-07-24 Therapeutic Exercise: NuStep LE only level 4 x 6 mins Sit to stand without UE support 2 x 10  LAQ on high low table R  2 x 10 with DF of foot GTB resisted R knee flexion 2 x 10 BTB resisted  R knee extension 2 x 10 Supine bridge with knees extended on foam roller 2 x 10  Seated AAROM knee flexion with opposite LE assist 2x10x5 hold  Supine  R Knee  Quad set on towel roll on ankle  3 x 10 (NT) Supine DKTC with ball x 20 (NT) Supine  heel  Slide  R 3 x 10  Neuro Re-Ed:  Heel raises in at counter 3x10 Heel walking at counter x 2 laps Tandem walking in at counter x 2 laps    [facility] Adult PT Treatment:                                                DATE: 05/23/24 Eval and HEP Self Care: Additional minutes spent for educating on updated Therapeutic Home Exercise Program as well as comparing current status to condition at start of symptoms. This included exercises focusing on stretching, strengthening, with focus on eccentric aspects. Long term goals include an improvement in range of motion, strength, endurance as well as avoiding reinjury. Patient's frequency would include in 1-2 times a day, 3-5 times a week for a duration of 6-12 weeks. Proper technique  shown and discussed handout in great detail. All questions were discussed and addressed.     PATIENT EDUCATION:  Education details: Discussed eval findings, rehab rationale and POC and patient is in agreement  Person educated: Patient Education method: Explanation and Handouts Education comprehension: verbalized understanding and needs further education  HOME EXERCISE PROGRAM: Access Code: [email or web address] URL: [email or web address] Date: 05/23/2024 Prepared by: Reyes Kohut  Exercises - Small Range Straight Leg Raise  - 5 x daily - 5 x weekly - 1 sets - 10 reps - Supine Knee Extension Strengthening  - 5 x daily - 5 x weekly - 1 sets - 10 reps - Seated Knee Flexion Slide  - 5 x daily - 5 x weekly -  1 sets - 10 reps  ASSESSMENT:  CLINICAL IMPRESSION: 06/15/2024 Patient had fair tolerance of today's exercises. He requested decreased intensity as he needs to drive home after today's visit. He has moderate fatigue at end of session, requiring seated rest break x 5 minutes. The pt will benefit from skilled physical therapy to decrease pain and increase function.    EVAL-Patient is a 58 y.o. male who was seen today for physical therapy evaluation and treatment for R knee weakness and loss of motion following TKA with liner replacement following traumatic fall post-op resulting in wound dehiscence.   OBJECTIVE IMPAIRMENTS: Abnormal gait, decreased activity tolerance, decreased balance, decreased endurance, decreased mobility, difficulty walking, decreased ROM, decreased strength, and pain.   ACTIVITY LIMITATIONS: carrying, lifting, standing, squatting, stairs, and transfers  PERSONAL FACTORS: Age, Fitness, and Past/current experiences are also affecting patient's functional outcome.   REHAB POTENTIAL: Good  CLINICAL DECISION MAKING: Stable/uncomplicated  EVALUATION COMPLEXITY: Low   GOALS: Goals reviewed with patient? No  SHORT TERM GOALS: Target date: 06/13/2024     Patient to demonstrate independence in HEP  Baseline: [id number] Goal status: INITIAL  2.  100d R knee flexion Baseline: 90d Goal status: INITIAL  3.  1 STS w/o arms Baseline: 0 Goal status: INITIAL    LONG TERM GOALS: Target date: 07/19/2024    4/5 R knee strength F/E Baseline: 4-/5 in F/E Goal status: INITIAL  2.  Patient will increase 30s chair stand reps from 0 to 5 without arms to demonstrate and improved functional ability with less pain/difficulty as well as reduce fall risk.  Baseline: 0 Goal status: INITIAL  3.  Patient to increase AROM R knee to -5d extension and 115d flexion Baseline: -18d to 90d respectively Goal status: INITIAL  4.  Patient will score at least 40/80 on LEFS to signify clinically meaningful improvement in functional abilities.   Baseline: 27/80 Goal status: INITIAL  5.  Patient will acknowledge 4/10 pain at least once during episode of care   Baseline: 6/10 worst pain Goal status: INITIAL    PLAN:  PT FREQUENCY: 2x/week  PT DURATION: 6 weeks  PLANNED INTERVENTIONS: 97110-Therapeutic exercises, 97530- Therapeutic activity, 97112- Neuromuscular re-education, 97535- Self Care, [id number]- Manual therapy, [phone]- Gait training, Patient/Family education, Balance training, and Stair training  PLAN FOR NEXT SESSION: HEP review and update, manual techniques as appropriate, aerobic tasks, ROM and flexibility activities, strengthening and PREs, TPDN, gait and balance training,aquatic therapy, modalities for pain and NMRE    For all possible CPT codes, reference the Planned Interventions line above.     Check all conditions that are expected to impact treatment: {Conditions expected to impact treatment:Complications related to surgery   If treatment provided at initial evaluation, no treatment charged due to lack of authorization.       Marko Molt, PT, DPT  06/15/2024 4:48 PM   "

## 2024-06-17 ENCOUNTER — Other Ambulatory Visit: Payer: Self-pay

## 2024-06-20 ENCOUNTER — Ambulatory Visit: Attending: Physician Assistant

## 2024-06-20 DIAGNOSIS — Z96651 Presence of right artificial knee joint: Secondary | ICD-10-CM | POA: Insufficient documentation

## 2024-06-20 DIAGNOSIS — M6281 Muscle weakness (generalized): Secondary | ICD-10-CM | POA: Diagnosis present

## 2024-06-20 DIAGNOSIS — G8929 Other chronic pain: Secondary | ICD-10-CM | POA: Insufficient documentation

## 2024-06-20 DIAGNOSIS — M25561 Pain in right knee: Secondary | ICD-10-CM | POA: Insufficient documentation

## 2024-06-20 NOTE — Therapy (Signed)
 " OUTPATIENT PHYSICAL THERAPY NOTE   Patient Name: Justin Burnett MRN: [id number] DOB:January 16, 1966, 59 y.o., male Today's Date: 06/21/2024  END OF SESSION:  PT End of Session - 06/20/24 1129     Visit Number 6    Number of Visits 12    Date for Recertification  07/24/24    Authorization Type MCD    Authorization Time Period Approved 13 visits 10/30/23-01/28/24    Authorization - Number of Visits 13    PT Start Time 1130    PT Stop Time 1210    PT Time Calculation (min) 40 min    Activity Tolerance Patient tolerated treatment well    Behavior During Therapy [facility] for tasks assessed/performed          Past Medical History:  Diagnosis Date   Abnormal EKG    Chronic bilateral low back pain without sciatica 03/13/2022   Essential (primary) hypertension 09/26/2021   Hyperlipidemia 10/17/2021   Hypertension    Inflammatory bowel disease    by 12/28/2023 colonoscopy   Nerve damage    Neuropathy    Osteoarthritis 11/03/2011   Osteoarthritis of knees, bilateral 06/13/2022   Paresthesia 03/13/2022   Prediabetes 10/17/2021   Past Surgical History:  Procedure Laterality Date   APPLICATION OF WOUND VAC Right 04/14/2024   Procedure: APPLICATION, WOUND VAC;  Surgeon: Jerri Kay HERO, MD;  Location: [facility] OR;  Service: Orthopedics;  Laterality: Right;   FINGER SURGERY Left    As a child. Tip of middle finger cut off in wood shop   I & D KNEE WITH POLY EXCHANGE Right 04/14/2024   Procedure: IRRIGATION AND DEBRIDEMENT KNEE WITH POLY EXCHANGE;  Surgeon: Jerri Kay HERO, MD;  Location: [facility] OR;  Service: Orthopedics;  Laterality: Right;  irrigation and debridement with possible zimmer poly exchange   TOTAL KNEE ARTHROPLASTY Left 10/26/2023   Procedure: ARTHROPLASTY, KNEE, TOTAL;  Surgeon: Jerri Kay HERO, MD;  Location: [facility] OR;  Service: Orthopedics;  Laterality: Left;   TOTAL KNEE ARTHROPLASTY Right 03/14/2024   Procedure: ARTHROPLASTY, KNEE, TOTAL;  Surgeon: Jerri Kay HERO, MD;  Location: [facility] OR;  Service:  Orthopedics;  Laterality: Right;   Patient Active Problem List   Diagnosis Date Noted   Wound dehiscence 04/13/2024   Status post total right knee replacement 03/14/2024   Status post total left knee replacement 10/26/2023   Preop cardiovascular exam 08/11/2023   Obesity (BMI 30.0-34.9) 08/11/2023   Cardiac murmur 08/11/2023   Cigarette smoker 08/11/2023   Prominent abdominal aortic pulsation 08/11/2023   Hypertension    Nerve damage    Neuromuscular disorder (HCC)    Abnormal EKG    Primary osteoarthritis of right knee 06/13/2022   Paresthesia 03/13/2022   Chronic bilateral low back pain without sciatica 03/13/2022   Hyperlipidemia 10/17/2021   Prediabetes 10/17/2021   Essential (primary) hypertension 09/26/2021   Neuropathy    Osteoarthritis 11/03/2011    PCP: Jaycee Greig PARAS, NP   REFERRING PROVIDER: Jule Ronal CROME, PA-C  REFERRING DIAG: [phone] (ICD-10-CM) - Status post total right knee replacement   THERAPY DIAG:  Acute pain of right knee  Muscle weakness (generalized)  History of total right knee replacement  Rationale for Evaluation and Treatment: Rehabilitation  ONSET DATE: 03/14/24  SUBJECTIVE:   SUBJECTIVE STATEMENT: 06/21/2024 Patient reports feeling good today. Noting no increase in pain since last session.   EVAL- Minimal R knee pain, main restriction is flexion.  Has discontinued hinged brace and is using knee sleeve.  PERTINENT HISTORY: patient comes in today 9 weeks s/p right TKA and 5 weeks s/p right knee I&D and poly exchange.  He has been wearing a bledsoe brace locked in full extension.  Only complaining of a dull ache and overall doing well.  Right knee exam shows a fully healed surgical scar.  No signs of infection or cellulitis.  Calf is soft and non-tender.  Today, I have unlocked the brace to full flexion. We will let PT help him wean out of the brace as he does not feel comfortable doing so at this time.  I sent in a new referral for  outpatient PT to begin range of motion exercises.  He will follow-up with Dr. Jerri in 4 weeks for recheck.  Call with concerns or questions in the meantime.  PAIN:  Are you having pain? Yes: NPRS scale: 2-6/10 Pain location: R knee Pain description: ache, sore Aggravating factors: bending, arising from sit Relieving factors: rest, sleeve  PRECAUTIONS: None  RED FLAGS: None   WEIGHT BEARING RESTRICTIONS: No  FALLS:  Has patient fallen in last 6 months? Yes. Number of falls 1  OCCUPATION: not working  PLOF: Independent  PATIENT GOALS: To regain my knee function  NEXT MD VISIT: TBD  OBJECTIVE:  Note: Objective measures were completed at Evaluation unless otherwise noted.  DIAGNOSTIC FINDINGS: X-rays of the right knee demonstrate a stable right total knee replacement  in good alignment   PATIENT SURVEYS:  LEFS 27/80  EDEMA:  Suprapatellar effusion observed  MUSCLE LENGTH: deferred  POSTURE: deferred  PALPATION: deferred  LOWER EXTREMITY ROM:  A/PROM Right eval Left eval Right 06-11-24  Hip flexion     Hip extension     Hip abduction     Hip adduction     Hip internal rotation     Hip external rotation     Knee flexion 90/90d  96 A  Knee extension -18/0d  -15 A  Ankle dorsiflexion     Ankle plantarflexion     Ankle inversion     Ankle eversion      (Blank rows = not tested)  LOWER EXTREMITY MMT:  MMT Right eval Left eval  Hip flexion    Hip extension    Hip abduction    Hip adduction    Hip internal rotation    Hip external rotation    Knee flexion 4-   Knee extension 4-   Ankle dorsiflexion    Ankle plantarflexion    Ankle inversion    Ankle eversion     (Blank rows = not tested)  LOWER EXTREMITY SPECIAL TESTS:  deferred  FUNCTIONAL TESTS:  30 seconds chair stand test 0 reps  GAIT: Distance walked: 44ftx2 Assistive device utilized: None Level of assistance: Complete Independence Comments: Mild antalgia  TREATMENT:   [facility] Adult PT Treatment:                                                DATE: 06/20/2024  Therapeutic Exercise: NuStep LE only level 4 x 6 mins Sit to stand without UE support, R foot staggered back, 2 x 10  LAQ on high low table R  2 x 10 with DF of foot GTB resisted R knee flexion 2 x 10 Supine DKTC with ball x 20   Neuro Re-Ed:  Supine bridge with calf on ball 2 x 10  Heel raises at counter 3x10 Tandem walking at counter  x 2 laps  Side stepping at counter RTB x 3 laps  [facility] Adult PT Treatment:                                                DATE: 06-07-24 Therapeutic Exercise: NuStep LE only level 4 x 6 mins Sit to stand without UE support 2 x 10  LAQ on high low table R  2 x 10 with DF of foot GTB resisted R knee flexion 2 x 10 BTB resisted  R knee extension 2 x 10 Supine bridge with knees extended on foam roller 2 x 10  Seated AAROM knee flexion with opposite LE assist 2x10x5 hold  Supine  R Knee  Quad set on towel roll on ankle  3 x 10 (NT) Supine DKTC with ball x 20 (NT) Supine  heel  Slide  R 3 x 10  Neuro Re-Ed:  Heel raises in at counter 3x10 Heel walking at counter x 2 laps Tandem walking in at counter x 2 laps    [name] Adult PT Treatment:                                                DATE: 05/23/24 Eval and HEP Self Care: Additional minutes spent for educating on updated Therapeutic Home Exercise Program as well as comparing current status to condition at start of symptoms. This included exercises focusing on stretching, strengthening, with focus on eccentric aspects. Long term goals include an improvement in range of motion, strength, endurance as well as avoiding reinjury. Patient's frequency would include in 1-2 times a day, 3-5 times a week for a duration of 6-12 weeks. Proper technique shown and discussed handout in great detail. All questions were  discussed and addressed.     PATIENT EDUCATION:  Education details: Discussed eval findings, rehab rationale and POC and patient is in agreement  Person educated: Patient Education method: Explanation and Handouts Education comprehension: verbalized understanding and needs further education  HOME EXERCISE PROGRAM: Access Code: [email or web address] URL: [email or web address] Date: 05/23/2024 Prepared by: Reyes Kohut  Exercises - Small Range Straight Leg Raise  - 5 x daily - 5 x weekly - 1 sets - 10 reps - Supine Knee Extension Strengthening  - 5 x daily - 5 x weekly - 1 sets - 10 reps - Seated Knee Flexion Slide  - 5 x daily - 5 x weekly - 1 sets - 10 reps  ASSESSMENT:  CLINICAL IMPRESSION: 06/21/2024 Patient introduced to side stepping with band to improve hip stability. Continued with knee strengthening with good tolerance. He was advised to perform LAQ with a band at home but not weighted.  The pt will benefit from skilled physical therapy to decrease pain and increase function.    EVAL-Patient is a 59 y.o. male who was seen today for physical therapy evaluation and treatment for R knee weakness and loss of motion following TKA with liner replacement following traumatic fall post-op resulting in wound dehiscence.   OBJECTIVE IMPAIRMENTS: Abnormal gait, decreased activity tolerance, decreased balance, decreased endurance, decreased mobility, difficulty walking, decreased ROM, decreased strength, and pain.   ACTIVITY LIMITATIONS: carrying, lifting, standing, squatting, stairs, and transfers  PERSONAL FACTORS: Age, Fitness, and Past/current experiences are also affecting patient's functional outcome.   REHAB POTENTIAL: Good  CLINICAL DECISION MAKING: Stable/uncomplicated  EVALUATION COMPLEXITY: Low   GOALS: Goals reviewed with patient? No  SHORT TERM GOALS: Target date: 06/13/2024    Patient to demonstrate independence in HEP  Baseline: [id number] Goal status:  INITIAL  2.  100d R knee flexion Baseline: 90d Goal status: INITIAL  3.  1 STS w/o arms Baseline: 0 Goal status: INITIAL    LONG TERM GOALS: Target date: 07/19/2024    4/5 R knee strength F/E Baseline: 4-/5 in F/E Goal status: INITIAL  2.  Patient will increase 30s chair stand reps from 0 to 5 without arms to demonstrate and improved functional ability with less pain/difficulty as well as reduce fall risk.  Baseline: 0 Goal status: INITIAL  3.  Patient to increase AROM R knee to -5d extension and 115d flexion Baseline: -18d to 90d respectively Goal status: INITIAL  4.  Patient will score at least 40/80 on LEFS to signify clinically meaningful improvement in functional abilities.   Baseline: 27/80 Goal status: INITIAL  5.  Patient will acknowledge 4/10 pain at least once during episode of care   Baseline: 6/10 worst pain Goal status: INITIAL    PLAN:  PT FREQUENCY: 2x/week  PT DURATION: 6 weeks  PLANNED INTERVENTIONS: 97110-Therapeutic exercises, 97530- Therapeutic activity, 97112- Neuromuscular re-education, 97535- Self Care, [id number]- Manual therapy, [phone]- Gait training, Patient/Family education, Balance training, and Stair training  PLAN FOR NEXT SESSION: HEP review and update, manual techniques as appropriate, aerobic tasks, ROM and flexibility activities, strengthening and PREs, TPDN, gait and balance training,aquatic therapy, modalities for pain and NMRE    For all possible CPT codes, reference the Planned Interventions line above.     Check all conditions that are expected to impact treatment: {Conditions expected to impact treatment:Complications related to surgery   If treatment provided at initial evaluation, no treatment charged due to lack of authorization.     Marijo Berber PT, DPT 06/21/2024 1:41 PM   "

## 2024-06-22 ENCOUNTER — Ambulatory Visit

## 2024-06-22 DIAGNOSIS — Z96651 Presence of right artificial knee joint: Secondary | ICD-10-CM

## 2024-06-22 DIAGNOSIS — M25561 Pain in right knee: Secondary | ICD-10-CM

## 2024-06-22 DIAGNOSIS — M6281 Muscle weakness (generalized): Secondary | ICD-10-CM

## 2024-06-22 NOTE — Therapy (Signed)
 " OUTPATIENT PHYSICAL THERAPY NOTE   Patient Name: Justin Burnett MRN: [id number] DOB:1966-02-16, 59 y.o., male Today's Date: 06/22/2024  END OF SESSION:  PT End of Session - 06/22/24 1135     Visit Number 7    Number of Visits 12    Date for Recertification  07/24/24    Authorization Type MCD    Authorization Time Period Approved 12 PT visits from 06/01/24-08/29/24    Authorization - Visit Number 4    Authorization - Number of Visits 12    PT Start Time 1132    PT Stop Time 1210    PT Time Calculation (min) 38 min    Activity Tolerance Patient tolerated treatment well    Behavior During Therapy [facility] for tasks assessed/performed           Past Medical History:  Diagnosis Date   Abnormal EKG    Chronic bilateral low back pain without sciatica 03/13/2022   Essential (primary) hypertension 09/26/2021   Hyperlipidemia 10/17/2021   Hypertension    Inflammatory bowel disease    by 12/28/2023 colonoscopy   Nerve damage    Neuropathy    Osteoarthritis 11/03/2011   Osteoarthritis of knees, bilateral 06/13/2022   Paresthesia 03/13/2022   Prediabetes 10/17/2021   Past Surgical History:  Procedure Laterality Date   APPLICATION OF WOUND VAC Right 04/14/2024   Procedure: APPLICATION, WOUND VAC;  Surgeon: Jerri Kay HERO, MD;  Location: [facility] OR;  Service: Orthopedics;  Laterality: Right;   FINGER SURGERY Left    As a child. Tip of middle finger cut off in wood shop   I & D KNEE WITH POLY EXCHANGE Right 04/14/2024   Procedure: IRRIGATION AND DEBRIDEMENT KNEE WITH POLY EXCHANGE;  Surgeon: Jerri Kay HERO, MD;  Location: [facility] OR;  Service: Orthopedics;  Laterality: Right;  irrigation and debridement with possible zimmer poly exchange   TOTAL KNEE ARTHROPLASTY Left 10/26/2023   Procedure: ARTHROPLASTY, KNEE, TOTAL;  Surgeon: Jerri Kay HERO, MD;  Location: [facility] OR;  Service: Orthopedics;  Laterality: Left;   TOTAL KNEE ARTHROPLASTY Right 03/14/2024   Procedure: ARTHROPLASTY, KNEE, TOTAL;  Surgeon:  Jerri Kay HERO, MD;  Location: [facility] OR;  Service: Orthopedics;  Laterality: Right;   Patient Active Problem List   Diagnosis Date Noted   Wound dehiscence 04/13/2024   Status post total right knee replacement 03/14/2024   Status post total left knee replacement 10/26/2023   Preop cardiovascular exam 08/11/2023   Obesity (BMI 30.0-34.9) 08/11/2023   Cardiac murmur 08/11/2023   Cigarette smoker 08/11/2023   Prominent abdominal aortic pulsation 08/11/2023   Hypertension    Nerve damage    Neuromuscular disorder (HCC)    Abnormal EKG    Primary osteoarthritis of right knee 06/13/2022   Paresthesia 03/13/2022   Chronic bilateral low back pain without sciatica 03/13/2022   Hyperlipidemia 10/17/2021   Prediabetes 10/17/2021   Essential (primary) hypertension 09/26/2021   Neuropathy    Osteoarthritis 11/03/2011    PCP: Jaycee Greig PARAS, NP   REFERRING PROVIDER: Jule Ronal CROME, PA-C  REFERRING DIAG: [phone] (ICD-10-CM) - Status post total right knee replacement   THERAPY DIAG:  Acute pain of right knee  Muscle weakness (generalized)  History of total right knee replacement  Rationale for Evaluation and Treatment: Rehabilitation  ONSET DATE: 03/14/24  SUBJECTIVE:   SUBJECTIVE STATEMENT: 06/22/2024 Patient reporting no change in symptoms after last visit. He is able to tolerate stair climbing well at home.   EVAL- Minimal R knee pain,  main restriction is flexion.  Has discontinued hinged brace and is using knee sleeve.    PERTINENT HISTORY: patient comes in today 9 weeks s/p right TKA and 5 weeks s/p right knee I&D and poly exchange.  He has been wearing a bledsoe brace locked in full extension.  Only complaining of a dull ache and overall doing well.  Right knee exam shows a fully healed surgical scar.  No signs of infection or cellulitis.  Calf is soft and non-tender.  Today, I have unlocked the brace to full flexion. We will let PT help him wean out of the brace as he does not  feel comfortable doing so at this time.  I sent in a new referral for outpatient PT to begin range of motion exercises.  He will follow-up with Dr. Jerri in 4 weeks for recheck.  Call with concerns or questions in the meantime.  PAIN:  Are you having pain? Yes: NPRS scale: 2-6/10 Pain location: R knee Pain description: ache, sore Aggravating factors: bending, arising from sit Relieving factors: rest, sleeve  PRECAUTIONS: None  RED FLAGS: None   WEIGHT BEARING RESTRICTIONS: No  FALLS:  Has patient fallen in last 6 months? Yes. Number of falls 1  OCCUPATION: not working  PLOF: Independent  PATIENT GOALS: To regain my knee function  NEXT MD VISIT: TBD  OBJECTIVE:  Note: Objective measures were completed at Evaluation unless otherwise noted.  DIAGNOSTIC FINDINGS: X-rays of the right knee demonstrate a stable right total knee replacement  in good alignment   PATIENT SURVEYS:  LEFS 27/80  EDEMA:  Suprapatellar effusion observed  MUSCLE LENGTH: deferred  POSTURE: deferred  PALPATION: deferred  LOWER EXTREMITY ROM:  A/PROM Right eval Left eval Right 06-11-24  Hip flexion     Hip extension     Hip abduction     Hip adduction     Hip internal rotation     Hip external rotation     Knee flexion 90/90d  96 A  Knee extension -18/0d  -15 A  Ankle dorsiflexion     Ankle plantarflexion     Ankle inversion     Ankle eversion      (Blank rows = not tested)  LOWER EXTREMITY MMT:  MMT Right eval Left eval  Hip flexion    Hip extension    Hip abduction    Hip adduction    Hip internal rotation    Hip external rotation    Knee flexion 4-   Knee extension 4-   Ankle dorsiflexion    Ankle plantarflexion    Ankle inversion    Ankle eversion     (Blank rows = not tested)  LOWER EXTREMITY SPECIAL TESTS:  deferred  FUNCTIONAL TESTS:  30 seconds chair stand test 0 reps  GAIT: Distance walked: 24ftx2 Assistive device utilized: None Level of assistance:  Complete Independence Comments: Mild antalgia  TREATMENT:   [facility] Adult PT Treatment:                                                DATE: 06/22/2024  Therapeutic Exercise: NuStep LE only level 5 x 6 mins Sit to stand without UE support, R foot staggered back, 2 x 10  LAQ on high low table R  2 x 10 with DF of foot, 2# ankle weight added today GTB resisted R knee flexion 2 x 10 Supine DKTC with ball x 20   Neuro Re-Ed:  Supine bridge with calf on ball 2 x 10  Heel raises at counter 3x10 Tandem walking at counter  x 2 laps  Side stepping at counter green TB x 3 laps Hurdle lateral step over, from airex pad to ground 2 x 10  SLS cone taps on airex   [facility] Adult PT Treatment:                                                DATE: 06/20/2024  Therapeutic Exercise: NuStep LE only level 4 x 6 mins Sit to stand without UE support, R foot staggered back, 2 x 10  LAQ on high low table R  2 x 10 with DF of foot GTB resisted R knee flexion 2 x 10 Supine DKTC with ball x 20   Neuro Re-Ed:  Supine bridge with calf on ball 2 x 10  Heel raises at counter 3x10 Tandem walking at counter  x 2 laps  Side stepping at counter RTB x 3 laps  [facility] Adult PT Treatment:                                                DATE: 06-07-24 Therapeutic Exercise: NuStep LE only level 4 x 6 mins Sit to stand without UE support 2 x 10  LAQ on high low table R  2 x 10 with DF of foot GTB resisted R knee flexion 2 x 10 BTB resisted  R knee extension 2 x 10 Supine bridge with knees extended on foam roller 2 x 10  Seated AAROM knee flexion with opposite LE assist 2x10x5 hold  Supine  R Knee  Quad set on towel roll on ankle  3 x 10 (NT) Supine DKTC with ball x 20 (NT) Supine  heel  Slide  R 3 x 10  Neuro Re-Ed:  Heel raises in at counter 3x10 Heel walking at counter x 2 laps Tandem walking  in at counter x 2 laps    PATIENT EDUCATION:  Education details: Discussed eval findings, rehab rationale and POC and patient is in agreement  Person educated: Patient Education method: Explanation and Handouts Education comprehension: verbalized understanding and needs further education  HOME EXERCISE PROGRAM: Access Code: [email or web address] URL: [email or web address] Date: 05/23/2024 Prepared by: Reyes Kohut  Exercises - Small Range Straight Leg Raise  - 5 x daily - 5 x weekly - 1 sets - 10 reps - Supine Knee Extension Strengthening  - 5 x daily - 5 x weekly - 1 sets - 10 reps - Seated  Knee Flexion Slide  - 5 x daily - 5 x weekly - 1 sets - 10 reps  ASSESSMENT:  CLINICAL IMPRESSION: 06/22/2024 Today's session focused on LE strength progress as appropriate. He is demonstrating extension lag with LAQ today, which was performed towards end of session. He also had difficulty with balance activities, utilizing airex pad. We will continue to progress as appropriate towards rehab goals.   EVAL-Patient is a 59 y.o. male who was seen today for physical therapy evaluation and treatment for R knee weakness and loss of motion following TKA with liner replacement following traumatic fall post-op resulting in wound dehiscence.   OBJECTIVE IMPAIRMENTS: Abnormal gait, decreased activity tolerance, decreased balance, decreased endurance, decreased mobility, difficulty walking, decreased ROM, decreased strength, and pain.   ACTIVITY LIMITATIONS: carrying, lifting, standing, squatting, stairs, and transfers  PERSONAL FACTORS: Age, Fitness, and Past/current experiences are also affecting patient's functional outcome.   REHAB POTENTIAL: Good  CLINICAL DECISION MAKING: Stable/uncomplicated  EVALUATION COMPLEXITY: Low   GOALS: Goals reviewed with patient? No  SHORT TERM GOALS: Target date: 06/13/2024    Patient to demonstrate independence in HEP  Baseline: [id number] Goal status:  INITIAL  2.  100d R knee flexion Baseline: 90d Goal status: INITIAL  3.  1 STS w/o arms Baseline: 0 Goal status: INITIAL    LONG TERM GOALS: Target date: 07/19/2024    4/5 R knee strength F/E Baseline: 4-/5 in F/E Goal status: INITIAL  2.  Patient will increase 30s chair stand reps from 0 to 5 without arms to demonstrate and improved functional ability with less pain/difficulty as well as reduce fall risk.  Baseline: 0 Goal status: INITIAL  3.  Patient to increase AROM R knee to -5d extension and 115d flexion Baseline: -18d to 90d respectively Goal status: INITIAL  4.  Patient will score at least 40/80 on LEFS to signify clinically meaningful improvement in functional abilities.   Baseline: 27/80 Goal status: INITIAL  5.  Patient will acknowledge 4/10 pain at least once during episode of care   Baseline: 6/10 worst pain Goal status: INITIAL    PLAN:  PT FREQUENCY: 2x/week  PT DURATION: 6 weeks  PLANNED INTERVENTIONS: 97110-Therapeutic exercises, 97530- Therapeutic activity, 97112- Neuromuscular re-education, 97535- Self Care, [id number]- Manual therapy, [phone]- Gait training, Patient/Family education, Balance training, and Stair training  PLAN FOR NEXT SESSION: HEP review and update, manual techniques as appropriate, aerobic tasks, ROM and flexibility activities, strengthening and PREs, TPDN, gait and balance training,aquatic therapy, modalities for pain and NMRE    For all possible CPT codes, reference the Planned Interventions line above.     Check all conditions that are expected to impact treatment: {Conditions expected to impact treatment:Complications related to surgery   Marko Molt, PT, DPT  06/22/2024 12:23 PM     "

## 2024-06-29 ENCOUNTER — Ambulatory Visit

## 2024-06-29 DIAGNOSIS — Z96651 Presence of right artificial knee joint: Secondary | ICD-10-CM

## 2024-06-29 DIAGNOSIS — M25561 Pain in right knee: Secondary | ICD-10-CM | POA: Diagnosis not present

## 2024-06-29 DIAGNOSIS — M6281 Muscle weakness (generalized): Secondary | ICD-10-CM

## 2024-06-29 NOTE — Therapy (Signed)
 " OUTPATIENT PHYSICAL THERAPY NOTE   Patient Name: Justin Burnett MRN: [id number] DOB:[birth year], 59 y.o., male Today's Date: 06/29/2024  END OF SESSION:  PT End of Session - 06/29/24 1047     Visit Number 8    Number of Visits 12    Date for Recertification  07/24/24    Authorization Type MCD    Authorization Time Period Approved 12 PT visits from 06/01/24-08/29/24    Authorization - Number of Visits 12    PT Start Time 1048    PT Stop Time 1126    PT Time Calculation (min) 38 min          Past Medical History:  Diagnosis Date   Abnormal EKG    Chronic bilateral low back pain without sciatica 03/13/2022   Essential (primary) hypertension 09/26/2021   Hyperlipidemia 10/17/2021   Hypertension    Inflammatory bowel disease    by 12/28/2023 colonoscopy   Nerve damage    Neuropathy    Osteoarthritis 11/03/2011   Osteoarthritis of knees, bilateral 06/13/2022   Paresthesia 03/13/2022   Prediabetes 10/17/2021   Past Surgical History:  Procedure Laterality Date   APPLICATION OF WOUND VAC Right 04/14/2024   Procedure: APPLICATION, WOUND VAC;  Surgeon: Jerri Kay HERO, MD;  Location: [facility] OR;  Service: Orthopedics;  Laterality: Right;   FINGER SURGERY Left    As a child. Tip of middle finger cut off in wood shop   I & D KNEE WITH POLY EXCHANGE Right 04/14/2024   Procedure: IRRIGATION AND DEBRIDEMENT KNEE WITH POLY EXCHANGE;  Surgeon: Jerri Kay HERO, MD;  Location: [facility];  Service: Orthopedics;  Laterality: Right;  irrigation and debridement with possible zimmer poly exchange   TOTAL KNEE ARTHROPLASTY Left 10/26/2023   Procedure: ARTHROPLASTY, KNEE, TOTAL;  Surgeon: Jerri Kay HERO, MD;  Location: [facility];  Service: Orthopedics;  Laterality: Left;   TOTAL KNEE ARTHROPLASTY Right 03/14/2024   Procedure: ARTHROPLASTY, KNEE, TOTAL;  Surgeon: Jerri Kay HERO, MD;  Location: [facility];  Service: Orthopedics;  Laterality: Right;   Patient Active Problem List   Diagnosis Date Noted   Wound dehiscence  04/13/2024   Status post total right knee replacement 03/14/2024   Status post total left knee replacement 10/26/2023   Preop cardiovascular exam 08/11/2023   Obesity (BMI 30.0-34.9) 08/11/2023   Cardiac murmur 08/11/2023   Cigarette smoker 08/11/2023   Prominent abdominal aortic pulsation 08/11/2023   Hypertension    Nerve damage    Neuromuscular disorder (HCC)    Abnormal EKG    Primary osteoarthritis of right knee 06/13/2022   Paresthesia 03/13/2022   Chronic bilateral low back pain without sciatica 03/13/2022   Hyperlipidemia 10/17/2021   Prediabetes 10/17/2021   Essential (primary) hypertension 09/26/2021   Neuropathy    Osteoarthritis 11/03/2011    PCP: Jaycee Greig PARAS, NP   REFERRING PROVIDER: Jule Ronal CROME, PA-C  REFERRING DIAG: [phone] (ICD-10-CM) - Status post total right knee replacement   THERAPY DIAG:  Acute pain of right knee  Muscle weakness (generalized)  History of total right knee replacement  Rationale for Evaluation and Treatment: Rehabilitation  ONSET DATE: 03/14/24  SUBJECTIVE:   SUBJECTIVE STATEMENT: 06/29/2024 Patient reporting 2-3/10 daily. He notes trouble walking Saturday but otherwise has been fine.   EVAL- Minimal R knee pain, main restriction is flexion.  Has discontinued hinged brace and is using knee sleeve.    PERTINENT HISTORY: patient comes in today 9 weeks s/p right TKA and 5 weeks s/p right  knee I&D and poly exchange.  He has been wearing a bledsoe brace locked in full extension.  Only complaining of a dull ache and overall doing well.  Right knee exam shows a fully healed surgical scar.  No signs of infection or cellulitis.  Calf is soft and non-tender.  Today, I have unlocked the brace to full flexion. We will let PT help him wean out of the brace as he does not feel comfortable doing so at this time.  I sent in a new referral for outpatient PT to begin range of motion exercises.  He will follow-up with Dr. Jerri in 4 weeks for  recheck.  Call with concerns or questions in the meantime.  PAIN:  Are you having pain? Yes: NPRS scale: 2-6/10 Pain location: R knee Pain description: ache, sore Aggravating factors: bending, arising from sit Relieving factors: rest, sleeve  PRECAUTIONS: None  RED FLAGS: None   WEIGHT BEARING RESTRICTIONS: No  FALLS:  Has patient fallen in last 6 months? Yes. Number of falls 1  OCCUPATION: not working  PLOF: Independent  PATIENT GOALS: To regain my knee function  NEXT MD VISIT: TBD  OBJECTIVE:  Note: Objective measures were completed at Evaluation unless otherwise noted.  DIAGNOSTIC FINDINGS: X-rays of the right knee demonstrate a stable right total knee replacement  in good alignment   PATIENT SURVEYS:  LEFS 27/80  EDEMA:  Suprapatellar effusion observed  MUSCLE LENGTH: deferred  POSTURE: deferred  PALPATION: deferred  LOWER EXTREMITY ROM:  A/PROM Right eval Left eval Right 06-11-24  Hip flexion     Hip extension     Hip abduction     Hip adduction     Hip internal rotation     Hip external rotation     Knee flexion 90/90d  96 A  Knee extension -18/0d  -15 A  Ankle dorsiflexion     Ankle plantarflexion     Ankle inversion     Ankle eversion      (Blank rows = not tested)  LOWER EXTREMITY MMT:  MMT Right eval Left eval  Hip flexion    Hip extension    Hip abduction    Hip adduction    Hip internal rotation    Hip external rotation    Knee flexion 4-   Knee extension 4-   Ankle dorsiflexion    Ankle plantarflexion    Ankle inversion    Ankle eversion     (Blank rows = not tested)  LOWER EXTREMITY SPECIAL TESTS:  deferred  FUNCTIONAL TESTS:  30 seconds chair stand test 0 reps  GAIT: Distance walked: 73ftx2 Assistive device utilized: None Level of assistance: Complete Independence Comments: Mild antalgia                                                                                                                                 TREATMENT:   [facility] Adult PT Treatment:  DATE: 06/29/2024  Therapeutic Exercise: NuStep LE only level 5 x 7 mins Sit to stand without UE support, R foot staggered back, 2 x 10  LAQ on high low table 4# ankle weight R  2 x 10 with DF of foot BTB resisted R knee flexion 2 x 10 Seated knee flexion with foot on slider x 20  Slant board stretch 2x30  Neuro Re-Ed:  Heel raises at counter 2x15 Tandem walking at counter x 2 laps  Side stepping at counter green TB x 3 laps (NT) Hurdle lateral step over, from airex pad to ground 2 x 10 (NT) SLS cone taps on airex (NT)  [facility] Adult PT Treatment:                                                DATE: 06/20/2024  Therapeutic Exercise: NuStep LE only level 4 x 6 mins Sit to stand without UE support, R foot staggered back, 2 x 10  LAQ on high low table R  2 x 10 with DF of foot GTB resisted R knee flexion 2 x 10 Supine DKTC with ball x 20   Neuro Re-Ed:  Supine bridge with calf on ball 2 x 10  Heel raises at counter 3x10 Tandem walking at counter  x 2 laps  Side stepping at counter RTB x 3 laps  [facility] Adult PT Treatment:                                                DATE: 06-07-24 Therapeutic Exercise: NuStep LE only level 4 x 6 mins Sit to stand without UE support 2 x 10  LAQ on high low table R  2 x 10 with DF of foot GTB resisted R knee flexion 2 x 10 BTB resisted  R knee extension 2 x 10 Supine bridge with knees extended on foam roller 2 x 10  Seated AAROM knee flexion with opposite LE assist 2x10x5 hold  Supine  R Knee  Quad set on towel roll on ankle  3 x 10 (NT) Supine DKTC with ball x 20 (NT) Supine  heel  Slide  R 3 x 10  Neuro Re-Ed:  Heel raises in at counter 3x10 Heel walking at counter x 2 laps Tandem walking in at counter x 2 laps    PATIENT EDUCATION:  Education details: Discussed eval findings, rehab rationale and POC and patient is in agreement  Person educated:  Patient Education method: Explanation and Handouts Education comprehension: verbalized understanding and needs further education  HOME EXERCISE PROGRAM: Access Code: [email or web address] URL: [email or web address] Date: 05/23/2024 Prepared by: Reyes Kohut  Exercises - Small Range Straight Leg Raise  - 5 x daily - 5 x weekly - 1 sets - 10 reps - Supine Knee Extension Strengthening  - 5 x daily - 5 x weekly - 1 sets - 10 reps - Seated Knee Flexion Slide  - 5 x daily - 5 x weekly - 1 sets - 10 reps  ASSESSMENT:  CLINICAL IMPRESSION: 06/29/2024 continued with knee ROM, LE strengthening, and stretching. Pt noting tension I the front of the knee with flexion. He was advised to use the blue TB at home and not weights to perform knee  extension. He verbalized understanding.  We will continue to progress as appropriate towards rehab goals.   EVAL-Patient is a 59 y.o. male who was seen today for physical therapy evaluation and treatment for R knee weakness and loss of motion following TKA with liner replacement following traumatic fall post-op resulting in wound dehiscence.   OBJECTIVE IMPAIRMENTS: Abnormal gait, decreased activity tolerance, decreased balance, decreased endurance, decreased mobility, difficulty walking, decreased ROM, decreased strength, and pain.   ACTIVITY LIMITATIONS: carrying, lifting, standing, squatting, stairs, and transfers  PERSONAL FACTORS: Age, Fitness, and Past/current experiences are also affecting patient's functional outcome.   REHAB POTENTIAL: Good  CLINICAL DECISION MAKING: Stable/uncomplicated  EVALUATION COMPLEXITY: Low   GOALS: Goals reviewed with patient? No  SHORT TERM GOALS: Target date: 06/13/2024    Patient to demonstrate independence in HEP  Baseline: [id number] Goal status: INITIAL  2.  100d R knee flexion Baseline: 90d Goal status: INITIAL  3.  1 STS w/o arms Baseline: 0 Goal status: INITIAL    LONG TERM GOALS: Target date:  07/19/2024    4/5 R knee strength F/E Baseline: 4-/5 in F/E Goal status: INITIAL  2.  Patient will increase 30s chair stand reps from 0 to 5 without arms to demonstrate and improved functional ability with less pain/difficulty as well as reduce fall risk.  Baseline: 0 Goal status: INITIAL  3.  Patient to increase AROM R knee to -5d extension and 115d flexion Baseline: -18d to 90d respectively Goal status: INITIAL  4.  Patient will score at least 40/80 on LEFS to signify clinically meaningful improvement in functional abilities.   Baseline: 27/80 Goal status: INITIAL  5.  Patient will acknowledge 4/10 pain at least once during episode of care   Baseline: 6/10 worst pain Goal status: INITIAL    PLAN:  PT FREQUENCY: 2x/week  PT DURATION: 6 weeks  PLANNED INTERVENTIONS: 97110-Therapeutic exercises, 97530- Therapeutic activity, 97112- Neuromuscular re-education, 97535- Self Care, [id number]- Manual therapy, [phone]- Gait training, Patient/Family education, Balance training, and Stair training  PLAN FOR NEXT SESSION: HEP review and update, manual techniques as appropriate, aerobic tasks, ROM and flexibility activities, strengthening and PREs, TPDN, gait and balance training,aquatic therapy, modalities for pain and NMRE    For all possible CPT codes, reference the Planned Interventions line above.     Check all conditions that are expected to impact treatment: {Conditions expected to impact treatment:Complications related to surgery   Marijo Berber PT, DPT 06/29/2024 11:49 AM  "

## 2024-07-01 ENCOUNTER — Ambulatory Visit

## 2024-07-01 DIAGNOSIS — M25561 Pain in right knee: Secondary | ICD-10-CM

## 2024-07-01 DIAGNOSIS — M6281 Muscle weakness (generalized): Secondary | ICD-10-CM

## 2024-07-01 DIAGNOSIS — Z96651 Presence of right artificial knee joint: Secondary | ICD-10-CM

## 2024-07-01 NOTE — Therapy (Signed)
 " OUTPATIENT PHYSICAL THERAPY NOTE   Patient Name: Justin Burnett MRN: [id number] DOB:July 25, 1965, 59 y.o., male Today's Date: 07/01/2024  END OF SESSION:  PT End of Session - 07/01/24 1352     Visit Number 9    Number of Visits 12    Date for Recertification  07/24/24    Authorization Type MCD    Authorization Time Period Approved 12 PT visits from 06/01/24-08/29/24    Authorization - Number of Visits 12    PT Start Time 1350    PT Stop Time 1428    PT Time Calculation (min) 38 min    Activity Tolerance Patient tolerated treatment well    Behavior During Therapy [facility] for tasks assessed/performed           Past Medical History:  Diagnosis Date   Abnormal EKG    Chronic bilateral low back pain without sciatica 03/13/2022   Essential (primary) hypertension 09/26/2021   Hyperlipidemia 10/17/2021   Hypertension    Inflammatory bowel disease    by 12/28/2023 colonoscopy   Nerve damage    Neuropathy    Osteoarthritis 11/03/2011   Osteoarthritis of knees, bilateral 06/13/2022   Paresthesia 03/13/2022   Prediabetes 10/17/2021   Past Surgical History:  Procedure Laterality Date   APPLICATION OF WOUND VAC Right 04/14/2024   Procedure: APPLICATION, WOUND VAC;  Surgeon: Jerri Kay HERO, MD;  Location: [facility] OR;  Service: Orthopedics;  Laterality: Right;   FINGER SURGERY Left    As a child. Tip of middle finger cut off in wood shop   I & D KNEE WITH POLY EXCHANGE Right 04/14/2024   Procedure: IRRIGATION AND DEBRIDEMENT KNEE WITH POLY EXCHANGE;  Surgeon: Jerri Kay HERO, MD;  Location: [facility] OR;  Service: Orthopedics;  Laterality: Right;  irrigation and debridement with possible zimmer poly exchange   TOTAL KNEE ARTHROPLASTY Left 10/26/2023   Procedure: ARTHROPLASTY, KNEE, TOTAL;  Surgeon: Jerri Kay HERO, MD;  Location: [facility];  Service: Orthopedics;  Laterality: Left;   TOTAL KNEE ARTHROPLASTY Right 03/14/2024   Procedure: ARTHROPLASTY, KNEE, TOTAL;  Surgeon: Jerri Kay HERO, MD;  Location: [facility];   Service: Orthopedics;  Laterality: Right;   Patient Active Problem List   Diagnosis Date Noted   Wound dehiscence 04/13/2024   Status post total right knee replacement 03/14/2024   Status post total left knee replacement 10/26/2023   Preop cardiovascular exam 08/11/2023   Obesity (BMI 30.0-34.9) 08/11/2023   Cardiac murmur 08/11/2023   Cigarette smoker 08/11/2023   Prominent abdominal aortic pulsation 08/11/2023   Hypertension    Nerve damage    Neuromuscular disorder (HCC)    Abnormal EKG    Primary osteoarthritis of right knee 06/13/2022   Paresthesia 03/13/2022   Chronic bilateral low back pain without sciatica 03/13/2022   Hyperlipidemia 10/17/2021   Prediabetes 10/17/2021   Essential (primary) hypertension 09/26/2021   Neuropathy    Osteoarthritis 11/03/2011    PCP: Jaycee Greig PARAS, NP   REFERRING PROVIDER: Jule Ronal CROME, PA-C  REFERRING DIAG: [phone] (ICD-10-CM) - Status post total right knee replacement   THERAPY DIAG:  Acute pain of right knee  Muscle weakness (generalized)  History of total right knee replacement  Rationale for Evaluation and Treatment: Rehabilitation  ONSET DATE: 03/14/24  SUBJECTIVE:   SUBJECTIVE STATEMENT: 07/01/2024 Patient reporting soreness today.   EVAL- Minimal R knee pain, main restriction is flexion.  Has discontinued hinged brace and is using knee sleeve.    PERTINENT HISTORY: patient comes in today  9 weeks s/p right TKA and 5 weeks s/p right knee I&D and poly exchange.  He has been wearing a bledsoe brace locked in full extension.  Only complaining of a dull ache and overall doing well.  Right knee exam shows a fully healed surgical scar.  No signs of infection or cellulitis.  Calf is soft and non-tender.  Today, I have unlocked the brace to full flexion. We will let PT help him wean out of the brace as he does not feel comfortable doing so at this time.  I sent in a new referral for outpatient PT to begin range of motion  exercises.  He will follow-up with Dr. Jerri in 4 weeks for recheck.  Call with concerns or questions in the meantime.  PAIN:  Are you having pain? Yes: NPRS scale: 2-6/10 Pain location: R knee Pain description: ache, sore Aggravating factors: bending, arising from sit Relieving factors: rest, sleeve  PRECAUTIONS: None  RED FLAGS: None   WEIGHT BEARING RESTRICTIONS: No  FALLS:  Has patient fallen in last 6 months? Yes. Number of falls 1  OCCUPATION: not working  PLOF: Independent  PATIENT GOALS: To regain my knee function  NEXT MD VISIT: TBD  OBJECTIVE:  Note: Objective measures were completed at Evaluation unless otherwise noted.  DIAGNOSTIC FINDINGS: X-rays of the right knee demonstrate a stable right total knee replacement  in good alignment   PATIENT SURVEYS:  LEFS 27/80  EDEMA:  Suprapatellar effusion observed  MUSCLE LENGTH: deferred  POSTURE: deferred  PALPATION: deferred  LOWER EXTREMITY ROM:  A/PROM Right eval Left eval Right 06-11-24  Hip flexion     Hip extension     Hip abduction     Hip adduction     Hip internal rotation     Hip external rotation     Knee flexion 90/90d  96 A  Knee extension -18/0d  -15 A  Ankle dorsiflexion     Ankle plantarflexion     Ankle inversion     Ankle eversion      (Blank rows = not tested)  LOWER EXTREMITY MMT:  MMT Right eval Left eval  Hip flexion    Hip extension    Hip abduction    Hip adduction    Hip internal rotation    Hip external rotation    Knee flexion 4-   Knee extension 4-   Ankle dorsiflexion    Ankle plantarflexion    Ankle inversion    Ankle eversion     (Blank rows = not tested)  LOWER EXTREMITY SPECIAL TESTS:  deferred  FUNCTIONAL TESTS:  30 seconds chair stand test 0 reps  GAIT: Distance walked: 59ftx2 Assistive device utilized: None Level of assistance: Complete Independence Comments: Mild antalgia  TREATMENT:   [facility] Adult PT Treatment:                                                DATE: 07/01/2024  Therapeutic Exercise: NuStep LE only level 6 x 7 mins Sit to stand without UE support, R foot staggered back, 2 x 10  LAQ on high low table 4# ankle weight R  2 x 10 with DF of foot BTB resisted R knee flexion 2 x 10 Seated knee flexion with foot on slider x 20  Slant board stretch 2x30  Neuro Re-Ed:  Heel raises at counter 2x15 Tandem walking at counter x 3 laps  Side stepping at counter green TB x 3 laps Hurdle lateral step over, from airex pad to ground 2 x 10  [facility] Adult PT Treatment:                                                DATE: 06/20/2024  Therapeutic Exercise: NuStep LE only level 4 x 6 mins Sit to stand without UE support, R foot staggered back, 2 x 10  LAQ on high low table R  2 x 10 with DF of foot GTB resisted R knee flexion 2 x 10 Supine DKTC with ball x 20   Neuro Re-Ed:  Supine bridge with calf on ball 2 x 10  Heel raises at counter 3x10 Tandem walking at counter  x 2 laps  Side stepping at counter RTB x 3 laps  [facility] Adult PT Treatment:                                                DATE: 06-07-24 Therapeutic Exercise: NuStep LE only level 4 x 6 mins Sit to stand without UE support 2 x 10  LAQ on high low table R  2 x 10 with DF of foot GTB resisted R knee flexion 2 x 10 BTB resisted  R knee extension 2 x 10 Supine bridge with knees extended on foam roller 2 x 10  Seated AAROM knee flexion with opposite LE assist 2x10x5 hold  Supine  R Knee  Quad set on towel roll on ankle  3 x 10 (NT) Supine DKTC with ball x 20 (NT) Supine  heel  Slide  R 3 x 10  Neuro Re-Ed:  Heel raises in at counter 3x10 Heel walking at counter x 2 laps Tandem walking in at counter x 2 laps    PATIENT EDUCATION:  Education details: Discussed eval findings, rehab rationale and POC and patient is in agreement   Person educated: Patient Education method: Explanation and Handouts Education comprehension: verbalized understanding and needs further education  HOME EXERCISE PROGRAM: Access Code: [email or web address] URL: [email or web address] Date: 05/23/2024 Prepared by: Reyes Kohut  Exercises - Small Range Straight Leg Raise  - 5 x daily - 5 x weekly - 1 sets - 10 reps - Supine Knee Extension Strengthening  - 5 x daily - 5 x weekly - 1 sets - 10 reps - Seated Knee Flexion Slide  - 5 x daily - 5 x weekly -  1 sets - 10 reps  ASSESSMENT:  CLINICAL IMPRESSION: 07/01/2024 challenged with hip strengthening today. Pt tolerable to all exercises. He was encouraged to use his treadmills at home.  We will continue to progress as appropriate towards rehab goals.   EVAL-Patient is a 59 y.o. male who was seen today for physical therapy evaluation and treatment for R knee weakness and loss of motion following TKA with liner replacement following traumatic fall post-op resulting in wound dehiscence.   OBJECTIVE IMPAIRMENTS: Abnormal gait, decreased activity tolerance, decreased balance, decreased endurance, decreased mobility, difficulty walking, decreased ROM, decreased strength, and pain.   ACTIVITY LIMITATIONS: carrying, lifting, standing, squatting, stairs, and transfers  PERSONAL FACTORS: Age, Fitness, and Past/current experiences are also affecting patient's functional outcome.   REHAB POTENTIAL: Good  CLINICAL DECISION MAKING: Stable/uncomplicated  EVALUATION COMPLEXITY: Low   GOALS: Goals reviewed with patient? No  SHORT TERM GOALS: Target date: 06/13/2024    Patient to demonstrate independence in HEP  Baseline: [id number] Goal status: INITIAL  2.  100d R knee flexion Baseline: 90d Goal status: INITIAL  3.  1 STS w/o arms Baseline: 0 Goal status: INITIAL    LONG TERM GOALS: Target date: 07/19/2024    4/5 R knee strength F/E Baseline: 4-/5 in F/E Goal status: INITIAL  2.   Patient will increase 30s chair stand reps from 0 to 5 without arms to demonstrate and improved functional ability with less pain/difficulty as well as reduce fall risk.  Baseline: 0 Goal status: INITIAL  3.  Patient to increase AROM R knee to -5d extension and 115d flexion Baseline: -18d to 90d respectively Goal status: INITIAL  4.  Patient will score at least 40/80 on LEFS to signify clinically meaningful improvement in functional abilities.   Baseline: 27/80 Goal status: INITIAL  5.  Patient will acknowledge 4/10 pain at least once during episode of care   Baseline: 6/10 worst pain Goal status: INITIAL    PLAN:  PT FREQUENCY: 2x/week  PT DURATION: 6 weeks  PLANNED INTERVENTIONS: 97110-Therapeutic exercises, 97530- Therapeutic activity, 97112- Neuromuscular re-education, 97535- Self Care, [id number]- Manual therapy, [phone]- Gait training, Patient/Family education, Balance training, and Stair training  PLAN FOR NEXT SESSION: HEP review and update, manual techniques as appropriate, aerobic tasks, ROM and flexibility activities, strengthening and PREs, TPDN, gait and balance training,aquatic therapy, modalities for pain and NMRE    For all possible CPT codes, reference the Planned Interventions line above.     Check all conditions that are expected to impact treatment: {Conditions expected to impact treatment:Complications related to surgery   Marijo Berber PT, DPT 07/01/2024 1:53 PM  "

## 2024-07-05 ENCOUNTER — Ambulatory Visit

## 2024-07-05 DIAGNOSIS — Z96651 Presence of right artificial knee joint: Secondary | ICD-10-CM

## 2024-07-05 DIAGNOSIS — M6281 Muscle weakness (generalized): Secondary | ICD-10-CM

## 2024-07-05 DIAGNOSIS — M25561 Pain in right knee: Secondary | ICD-10-CM

## 2024-07-05 NOTE — Therapy (Signed)
 " OUTPATIENT PHYSICAL THERAPY NOTE   Patient Name: Justin Burnett MRN: [id number] DOB:10-16-1965, 59 y.o., male Today's Date: 07/05/2024  END OF SESSION:  PT End of Session - 07/05/24 1143     Visit Number 10    Number of Visits 12    Date for Recertification  07/24/24    Authorization Type MCD    Authorization Time Period Approved 12 PT visits from 06/01/24-08/29/24    Authorization - Number of Visits 12    PT Start Time 1138    PT Stop Time 1216    PT Time Calculation (min) 38 min    Activity Tolerance Patient tolerated treatment well    Behavior During Therapy [facility] for tasks assessed/performed            Past Medical History:  Diagnosis Date   Abnormal EKG    Chronic bilateral low back pain without sciatica 03/13/2022   Essential (primary) hypertension 09/26/2021   Hyperlipidemia 10/17/2021   Hypertension    Inflammatory bowel disease    by 12/28/2023 colonoscopy   Nerve damage    Neuropathy    Osteoarthritis 11/03/2011   Osteoarthritis of knees, bilateral 06/13/2022   Paresthesia 03/13/2022   Prediabetes 10/17/2021   Past Surgical History:  Procedure Laterality Date   APPLICATION OF WOUND VAC Right 04/14/2024   Procedure: APPLICATION, WOUND VAC;  Surgeon: Jerri Kay HERO, MD;  Location: [facility] OR;  Service: Orthopedics;  Laterality: Right;   FINGER SURGERY Left    As a child. Tip of middle finger cut off in wood shop   I & D KNEE WITH POLY EXCHANGE Right 04/14/2024   Procedure: IRRIGATION AND DEBRIDEMENT KNEE WITH POLY EXCHANGE;  Surgeon: Jerri Kay HERO, MD;  Location: [facility] OR;  Service: Orthopedics;  Laterality: Right;  irrigation and debridement with possible zimmer poly exchange   TOTAL KNEE ARTHROPLASTY Left 10/26/2023   Procedure: ARTHROPLASTY, KNEE, TOTAL;  Surgeon: Jerri Kay HERO, MD;  Location: [facility] OR;  Service: Orthopedics;  Laterality: Left;   TOTAL KNEE ARTHROPLASTY Right 03/14/2024   Procedure: ARTHROPLASTY, KNEE, TOTAL;  Surgeon: Jerri Kay HERO, MD;  Location: [facility]  OR;  Service: Orthopedics;  Laterality: Right;   Patient Active Problem List   Diagnosis Date Noted   Wound dehiscence 04/13/2024   Status post total right knee replacement 03/14/2024   Status post total left knee replacement 10/26/2023   Preop cardiovascular exam 08/11/2023   Obesity (BMI 30.0-34.9) 08/11/2023   Cardiac murmur 08/11/2023   Cigarette smoker 08/11/2023   Prominent abdominal aortic pulsation 08/11/2023   Hypertension    Nerve damage    Neuromuscular disorder (HCC)    Abnormal EKG    Primary osteoarthritis of right knee 06/13/2022   Paresthesia 03/13/2022   Chronic bilateral low back pain without sciatica 03/13/2022   Hyperlipidemia 10/17/2021   Prediabetes 10/17/2021   Essential (primary) hypertension 09/26/2021   Neuropathy    Osteoarthritis 11/03/2011    PCP: Jaycee Greig PARAS, NP   REFERRING PROVIDER: Jule Ronal CROME, PA-C  REFERRING DIAG: [phone] (ICD-10-CM) - Status post total right knee replacement   THERAPY DIAG:  Acute pain of right knee  Muscle weakness (generalized)  History of total right knee replacement  Rationale for Evaluation and Treatment: Rehabilitation  ONSET DATE: 03/14/24  SUBJECTIVE:   SUBJECTIVE STATEMENT: 07/05/2024 Pt semi-complaint with HEP. Noting no pain upon arrival.   EVAL- Minimal R knee pain, main restriction is flexion.  Has discontinued hinged brace and is using knee sleeve.  PERTINENT HISTORY: patient comes in today 9 weeks s/p right TKA and 5 weeks s/p right knee I&D and poly exchange.  He has been wearing a bledsoe brace locked in full extension.  Only complaining of a dull ache and overall doing well.  Right knee exam shows a fully healed surgical scar.  No signs of infection or cellulitis.  Calf is soft and non-tender.  Today, I have unlocked the brace to full flexion. We will let PT help him wean out of the brace as he does not feel comfortable doing so at this time.  I sent in a new referral for outpatient PT to  begin range of motion exercises.  He will follow-up with Dr. Jerri in 4 weeks for recheck.  Call with concerns or questions in the meantime.  PAIN:  Are you having pain? Yes: NPRS scale: 2-6/10 Pain location: R knee Pain description: ache, sore Aggravating factors: bending, arising from sit Relieving factors: rest, sleeve  PRECAUTIONS: None  RED FLAGS: None   WEIGHT BEARING RESTRICTIONS: No  FALLS:  Has patient fallen in last 6 months? Yes. Number of falls 1  OCCUPATION: not working  PLOF: Independent  PATIENT GOALS: To regain my knee function  NEXT MD VISIT: TBD  OBJECTIVE:  Note: Objective measures were completed at Evaluation unless otherwise noted.  DIAGNOSTIC FINDINGS: X-rays of the right knee demonstrate a stable right total knee replacement  in good alignment   PATIENT SURVEYS:  LEFS 27/80  EDEMA:  Suprapatellar effusion observed  MUSCLE LENGTH: deferred  POSTURE: deferred  PALPATION: deferred  LOWER EXTREMITY ROM:  A/PROM Right eval Left eval Right 06-11-24  Hip flexion     Hip extension     Hip abduction     Hip adduction     Hip internal rotation     Hip external rotation     Knee flexion 90/90d  96 A  Knee extension -18/0d  -15 A  Ankle dorsiflexion     Ankle plantarflexion     Ankle inversion     Ankle eversion      (Blank rows = not tested)  LOWER EXTREMITY MMT:  MMT Right eval Left eval  Hip flexion    Hip extension    Hip abduction    Hip adduction    Hip internal rotation    Hip external rotation    Knee flexion 4-   Knee extension 4-   Ankle dorsiflexion    Ankle plantarflexion    Ankle inversion    Ankle eversion     (Blank rows = not tested)  LOWER EXTREMITY SPECIAL TESTS:  deferred  FUNCTIONAL TESTS:  30 seconds chair stand test 0 reps  GAIT: Distance walked: 31ftx2 Assistive device utilized: None Level of assistance: Complete Independence Comments: Mild antalgia  TREATMENT:   [facility] Adult PT Treatment:                                                DATE: 07/05/2024  Therapeutic Exercise: Recumbent bike x 8 mins  Sit to stand without UE support, R foot staggered back, 2 x 10  LAQ on high low table 5# ankle weight R  2 x 10 with ball between knees  BTB resisted R knee flexion 2 x 10 Slant board stretch 2x30  Therapeutic Activity:  Heel raises at counter 2x15 Side stepping at counter green TB x 3 laps Step ups on 6 step x 10 B   [facility] Adult PT Treatment:                                                DATE: 06/20/2024  Therapeutic Exercise: NuStep LE only level 4 x 6 mins Sit to stand without UE support, R foot staggered back, 2 x 10  LAQ on high low table R  2 x 10 with ball between knees  GTB resisted R knee flexion 2 x 10 Supine DKTC with ball x 20   Neuro Re-Ed:  Supine bridge with calf on ball 2 x 10  Heel raises at counter 3x10 Tandem walking at counter  x 2 laps  Side stepping at counter RTB x 3 laps  [facility] Adult PT Treatment:                                                DATE: 06-07-24 Therapeutic Exercise: NuStep LE only level 4 x 6 mins Sit to stand without UE support 2 x 10  LAQ on high low table R  2 x 10 with DF of foot GTB resisted R knee flexion 2 x 10 BTB resisted  R knee extension 2 x 10 Supine bridge with knees extended on foam roller 2 x 10  Seated AAROM knee flexion with opposite LE assist 2x10x5 hold  Supine  R Knee  Quad set on towel roll on ankle  3 x 10 (NT) Supine DKTC with ball x 20 (NT) Supine  heel  Slide  R 3 x 10  Neuro Re-Ed:  Heel raises in at counter 3x10 Heel walking at counter x 2 laps Tandem walking in at counter x 2 laps    PATIENT EDUCATION:  Education details: Discussed eval findings, rehab rationale and POC and patient is in agreement  Person educated: Patient Education method: Explanation and  Handouts Education comprehension: verbalized understanding and needs further education  HOME EXERCISE PROGRAM: Access Code: [email or web address] URL: [email or web address] Date: 05/23/2024 Prepared by: Reyes Kohut  Exercises - Small Range Straight Leg Raise  - 5 x daily - 5 x weekly - 1 sets - 10 reps - Supine Knee Extension Strengthening  - 5 x daily - 5 x weekly - 1 sets - 10 reps - Seated Knee Flexion Slide  - 5 x daily - 5 x weekly - 1 sets - 10 reps  ASSESSMENT:  CLINICAL IMPRESSION: 07/05/2024  Pt challenged with step ups today. He continues to  have limitations in knee extension.  We will continue to progress as appropriate towards rehab goals.   EVAL-Patient is a 59 y.o. male who was seen today for physical therapy evaluation and treatment for R knee weakness and loss of motion following TKA with liner replacement following traumatic fall post-op resulting in wound dehiscence.   OBJECTIVE IMPAIRMENTS: Abnormal gait, decreased activity tolerance, decreased balance, decreased endurance, decreased mobility, difficulty walking, decreased ROM, decreased strength, and pain.   ACTIVITY LIMITATIONS: carrying, lifting, standing, squatting, stairs, and transfers  PERSONAL FACTORS: Age, Fitness, and Past/current experiences are also affecting patient's functional outcome.   REHAB POTENTIAL: Good  CLINICAL DECISION MAKING: Stable/uncomplicated  EVALUATION COMPLEXITY: Low   GOALS: Goals reviewed with patient? No  SHORT TERM GOALS: Target date: 06/13/2024    Patient to demonstrate independence in HEP  Baseline: [id number] Goal status: INITIAL  2.  100d R knee flexion Baseline: 90d Goal status: INITIAL  3.  1 STS w/o arms Baseline: 0 Goal status: INITIAL    LONG TERM GOALS: Target date: 07/19/2024    4/5 R knee strength F/E Baseline: 4-/5 in F/E Goal status: INITIAL  2.  Patient will increase 30s chair stand reps from 0 to 5 without arms to demonstrate and  improved functional ability with less pain/difficulty as well as reduce fall risk.  Baseline: 0 Goal status: INITIAL  3.  Patient to increase AROM R knee to -5d extension and 115d flexion Baseline: -18d to 90d respectively Goal status: INITIAL  4.  Patient will score at least 40/80 on LEFS to signify clinically meaningful improvement in functional abilities.   Baseline: 27/80 Goal status: INITIAL  5.  Patient will acknowledge 4/10 pain at least once during episode of care   Baseline: 6/10 worst pain Goal status: INITIAL    PLAN:  PT FREQUENCY: 2x/week  PT DURATION: 6 weeks  PLANNED INTERVENTIONS: 97110-Therapeutic exercises, 97530- Therapeutic activity, 97112- Neuromuscular re-education, 97535- Self Care, [id number]- Manual therapy, [phone]- Gait training, Patient/Family education, Balance training, and Stair training  PLAN FOR NEXT SESSION: HEP review and update, manual techniques as appropriate, aerobic tasks, ROM and flexibility activities, strengthening and PREs, TPDN, gait and balance training,aquatic therapy, modalities for pain and NMRE    For all possible CPT codes, reference the Planned Interventions line above.     Check all conditions that are expected to impact treatment: {Conditions expected to impact treatment:Complications related to surgery   Marijo Berber PT, DPT 07/05/2024 12:04 PM  "

## 2024-07-07 ENCOUNTER — Ambulatory Visit

## 2024-07-07 DIAGNOSIS — M6281 Muscle weakness (generalized): Secondary | ICD-10-CM

## 2024-07-07 DIAGNOSIS — M25561 Pain in right knee: Secondary | ICD-10-CM | POA: Diagnosis not present

## 2024-07-07 DIAGNOSIS — Z96651 Presence of right artificial knee joint: Secondary | ICD-10-CM

## 2024-07-07 DIAGNOSIS — G8929 Other chronic pain: Secondary | ICD-10-CM

## 2024-07-07 NOTE — Therapy (Signed)
 " OUTPATIENT PHYSICAL THERAPY NOTE   Patient Name: Justin Burnett MRN: [id number] DOB:10-20-1965, 59 y.o., male Today's Date: 07/07/2024  END OF SESSION:  PT End of Session - 07/07/24 1138     Visit Number 11    Number of Visits 12    Date for Recertification  07/24/24    Authorization Type MCD    Authorization Time Period Approved 12 PT visits from 06/01/24-08/29/24    Authorization - Visit Number 5    Authorization - Number of Visits 12    PT Start Time 1135    PT Stop Time 1213    PT Time Calculation (min) 38 min    Activity Tolerance Patient tolerated treatment well    Behavior During Therapy [facility] for tasks assessed/performed             Past Medical History:  Diagnosis Date   Abnormal EKG    Chronic bilateral low back pain without sciatica 03/13/2022   Essential (primary) hypertension 09/26/2021   Hyperlipidemia 10/17/2021   Hypertension    Inflammatory bowel disease    by 12/28/2023 colonoscopy   Nerve damage    Neuropathy    Osteoarthritis 11/03/2011   Osteoarthritis of knees, bilateral 06/13/2022   Paresthesia 03/13/2022   Prediabetes 10/17/2021   Past Surgical History:  Procedure Laterality Date   APPLICATION OF WOUND VAC Right 04/14/2024   Procedure: APPLICATION, WOUND VAC;  Surgeon: Jerri Kay HERO, MD;  Location: [facility] OR;  Service: Orthopedics;  Laterality: Right;   FINGER SURGERY Left    As a child. Tip of middle finger cut off in wood shop   I & D KNEE WITH POLY EXCHANGE Right 04/14/2024   Procedure: IRRIGATION AND DEBRIDEMENT KNEE WITH POLY EXCHANGE;  Surgeon: Jerri Kay HERO, MD;  Location: [facility] OR;  Service: Orthopedics;  Laterality: Right;  irrigation and debridement with possible zimmer poly exchange   TOTAL KNEE ARTHROPLASTY Left 10/26/2023   Procedure: ARTHROPLASTY, KNEE, TOTAL;  Surgeon: Jerri Kay HERO, MD;  Location: [facility] OR;  Service: Orthopedics;  Laterality: Left;   TOTAL KNEE ARTHROPLASTY Right 03/14/2024   Procedure: ARTHROPLASTY, KNEE, TOTAL;   Surgeon: Jerri Kay HERO, MD;  Location: [facility] OR;  Service: Orthopedics;  Laterality: Right;   Patient Active Problem List   Diagnosis Date Noted   Wound dehiscence 04/13/2024   Status post total right knee replacement 03/14/2024   Status post total left knee replacement 10/26/2023   Preop cardiovascular exam 08/11/2023   Obesity (BMI 30.0-34.9) 08/11/2023   Cardiac murmur 08/11/2023   Cigarette smoker 08/11/2023   Prominent abdominal aortic pulsation 08/11/2023   Hypertension    Nerve damage    Neuromuscular disorder (HCC)    Abnormal EKG    Primary osteoarthritis of right knee 06/13/2022   Paresthesia 03/13/2022   Chronic bilateral low back pain without sciatica 03/13/2022   Hyperlipidemia 10/17/2021   Prediabetes 10/17/2021   Essential (primary) hypertension 09/26/2021   Neuropathy    Osteoarthritis 11/03/2011    PCP: Jaycee Greig PARAS, NP   REFERRING PROVIDER: Jule Ronal CROME, PA-C  REFERRING DIAG: [phone] (ICD-10-CM) - Status post total right knee replacement   THERAPY DIAG:  Acute pain of right knee  Muscle weakness (generalized)  History of total right knee replacement  Chronic pain of right knee  Rationale for Evaluation and Treatment: Rehabilitation  ONSET DATE: 03/14/24  SUBJECTIVE:   SUBJECTIVE STATEMENT: 07/07/2024 Pt semi-complaint with HEP. Noting no pain upon arrival.   EVAL- Minimal R knee pain, main restriction  is flexion.  Has discontinued hinged brace and is using knee sleeve.    PERTINENT HISTORY: patient comes in today 9 weeks s/p right TKA and 5 weeks s/p right knee I&D and poly exchange.  He has been wearing a bledsoe brace locked in full extension.  Only complaining of a dull ache and overall doing well.  Right knee exam shows a fully healed surgical scar.  No signs of infection or cellulitis.  Calf is soft and non-tender.  Today, I have unlocked the brace to full flexion. We will let PT help him wean out of the brace as he does not feel  comfortable doing so at this time.  I sent in a new referral for outpatient PT to begin range of motion exercises.  He will follow-up with Dr. Jerri in 4 weeks for recheck.  Call with concerns or questions in the meantime.  PAIN:  Are you having pain? Yes: NPRS scale: 2-6/10 Pain location: R knee Pain description: ache, sore Aggravating factors: bending, arising from sit Relieving factors: rest, sleeve  PRECAUTIONS: None  RED FLAGS: None   WEIGHT BEARING RESTRICTIONS: No  FALLS:  Has patient fallen in last 6 months? Yes. Number of falls 1  OCCUPATION: not working  PLOF: Independent  PATIENT GOALS: To regain my knee function  NEXT MD VISIT: TBD  OBJECTIVE:  Note: Objective measures were completed at Evaluation unless otherwise noted.  DIAGNOSTIC FINDINGS: X-rays of the right knee demonstrate a stable right total knee replacement  in good alignment   PATIENT SURVEYS:  LEFS 27/80  EDEMA:  Suprapatellar effusion observed  MUSCLE LENGTH: deferred  POSTURE: deferred  PALPATION: deferred  LOWER EXTREMITY ROM:  A/PROM Right eval Left eval Right 06-11-24  Hip flexion     Hip extension     Hip abduction     Hip adduction     Hip internal rotation     Hip external rotation     Knee flexion 90/90d  96 A  Knee extension -18/0d  -15 A  Ankle dorsiflexion     Ankle plantarflexion     Ankle inversion     Ankle eversion      (Blank rows = not tested)  LOWER EXTREMITY MMT:  MMT Right eval Left eval  Hip flexion    Hip extension    Hip abduction    Hip adduction    Hip internal rotation    Hip external rotation    Knee flexion 4-   Knee extension 4-   Ankle dorsiflexion    Ankle plantarflexion    Ankle inversion    Ankle eversion     (Blank rows = not tested)  LOWER EXTREMITY SPECIAL TESTS:  deferred  FUNCTIONAL TESTS:  30 seconds chair stand test 0 reps  GAIT: Distance walked: 94ftx2 Assistive device utilized: None Level of assistance: Complete  Independence Comments: Mild antalgia  TREATMENT:   [facility] Adult PT Treatment:                                                DATE: 07/07/2024  Therapeutic Exercise: Recumbent bike x 8 mins  Sit to stand without UE support, R foot staggered back, 2 x 10  LAQ on high low table 5# ankle weight R  2 x 10 with ball between knees  BTB resisted R knee flexion 2 x 10 Slant board stretch 2x30  Therapeutic Activity:  Heel raises at counter 2x15 Side stepping at counter green TB x 3 laps Monster walk at counter fwd/back x 3 laps  Step ups on 6 step x 10 B  Runner's Step Up 6 x 5   [facility] Adult PT Treatment:                                                DATE: 07/05/2024  Therapeutic Exercise: Recumbent bike x 8 mins  Sit to stand without UE support, R foot staggered back, 2 x 10  LAQ on high low table 5# ankle weight R  2 x 10 with ball between knees  BTB resisted R knee flexion 2 x 10 Slant board stretch 2x30  Therapeutic Activity:  Heel raises at counter 2x15 Side stepping at counter green TB x 3 laps Step ups on 6 step x 10 B   [facility] Adult PT Treatment:                                                DATE: 06/20/2024  Therapeutic Exercise: NuStep LE only level 4 x 6 mins Sit to stand without UE support, R foot staggered back, 2 x 10  LAQ on high low table R  2 x 10 with ball between knees  GTB resisted R knee flexion 2 x 10 Supine DKTC with ball x 20   Neuro Re-Ed:  Supine bridge with calf on ball 2 x 10  Heel raises at counter 3x10 Tandem walking at counter  x 2 laps  Side stepping at counter RTB x 3 laps  [facility] Adult PT Treatment:                                                DATE: 06-07-24 Therapeutic Exercise: NuStep LE only level 4 x 6 mins Sit to stand without UE support 2 x 10  LAQ on high low table R  2 x 10 with DF of foot GTB resisted R  knee flexion 2 x 10 BTB resisted  R knee extension 2 x 10 Supine bridge with knees extended on foam roller 2 x 10  Seated AAROM knee flexion with opposite LE assist 2x10x5 hold  Supine  R Knee  Quad set on towel roll on ankle  3 x 10 (NT) Supine DKTC with ball x 20 (NT) Supine  heel  Slide  R 3 x 10  Neuro Re-Ed:  Heel raises  in at counter 3x10 Heel walking at counter x 2 laps Tandem walking in at counter x 2 laps    PATIENT EDUCATION:  Education details: Discussed eval findings, rehab rationale and POC and patient is in agreement  Person educated: Patient Education method: Explanation and Handouts Education comprehension: verbalized understanding and needs further education  HOME EXERCISE PROGRAM: Access Code: [email or web address] URL: [email or web address] Date: 05/23/2024 Prepared by: Reyes Kohut  Exercises - Small Range Straight Leg Raise  - 5 x daily - 5 x weekly - 1 sets - 10 reps - Supine Knee Extension Strengthening  - 5 x daily - 5 x weekly - 1 sets - 10 reps - Seated Knee Flexion Slide  - 5 x daily - 5 x weekly - 1 sets - 10 reps  ASSESSMENT:  CLINICAL IMPRESSION: 07/07/2024  Pt continues to be challenged with step ups today. He reports some burning after standing activities and requires to seated rest breaks today. We will continue to progress as appropriate towards rehab goals.   EVAL-Patient is a 59 y.o. male who was seen today for physical therapy evaluation and treatment for R knee weakness and loss of motion following TKA with liner replacement following traumatic fall post-op resulting in wound dehiscence.   OBJECTIVE IMPAIRMENTS: Abnormal gait, decreased activity tolerance, decreased balance, decreased endurance, decreased mobility, difficulty walking, decreased ROM, decreased strength, and pain.   ACTIVITY LIMITATIONS: carrying, lifting, standing, squatting, stairs, and transfers  PERSONAL FACTORS: Age, Fitness, and Past/current experiences are also  affecting patient's functional outcome.   REHAB POTENTIAL: Good  CLINICAL DECISION MAKING: Stable/uncomplicated  EVALUATION COMPLEXITY: Low   GOALS: Goals reviewed with patient? No  SHORT TERM GOALS: Target date: 06/13/2024    Patient to demonstrate independence in HEP  Baseline: [id number] Goal status: INITIAL  2.  100d R knee flexion Baseline: 90d Goal status: INITIAL  3.  1 STS w/o arms Baseline: 0 Goal status: INITIAL    LONG TERM GOALS: Target date: 07/19/2024    4/5 R knee strength F/E Baseline: 4-/5 in F/E Goal status: INITIAL  2.  Patient will increase 30s chair stand reps from 0 to 5 without arms to demonstrate and improved functional ability with less pain/difficulty as well as reduce fall risk.  Baseline: 0 Goal status: INITIAL  3.  Patient to increase AROM R knee to -5d extension and 115d flexion Baseline: -18d to 90d respectively Goal status: INITIAL  4.  Patient will score at least 40/80 on LEFS to signify clinically meaningful improvement in functional abilities.   Baseline: 27/80 Goal status: INITIAL  5.  Patient will acknowledge 4/10 pain at least once during episode of care   Baseline: 6/10 worst pain Goal status: INITIAL    PLAN:  PT FREQUENCY: 2x/week  PT DURATION: 6 weeks  PLANNED INTERVENTIONS: 97110-Therapeutic exercises, 97530- Therapeutic activity, 97112- Neuromuscular re-education, 97535- Self Care, [id number]- Manual therapy, [phone]- Gait training, Patient/Family education, Balance training, and Stair training  PLAN FOR NEXT SESSION: HEP review and update, manual techniques as appropriate, aerobic tasks, ROM and flexibility activities, strengthening and PREs, TPDN, gait and balance training,aquatic therapy, modalities for pain and NMRE    For all possible CPT codes, reference the Planned Interventions line above.     Check all conditions that are expected to impact treatment: {Conditions expected to impact treatment:Complications  related to surgery   Marko Molt, PT, DPT  07/07/2024 12:36 PM   "

## 2024-07-12 ENCOUNTER — Ambulatory Visit

## 2024-07-14 ENCOUNTER — Other Ambulatory Visit: Payer: Self-pay

## 2024-07-14 ENCOUNTER — Ambulatory Visit

## 2024-07-14 ENCOUNTER — Other Ambulatory Visit: Payer: Self-pay | Admitting: Family

## 2024-07-14 DIAGNOSIS — M25561 Pain in right knee: Secondary | ICD-10-CM | POA: Diagnosis not present

## 2024-07-14 DIAGNOSIS — G609 Hereditary and idiopathic neuropathy, unspecified: Secondary | ICD-10-CM

## 2024-07-14 DIAGNOSIS — E785 Hyperlipidemia, unspecified: Secondary | ICD-10-CM

## 2024-07-14 DIAGNOSIS — Z96651 Presence of right artificial knee joint: Secondary | ICD-10-CM

## 2024-07-14 DIAGNOSIS — M6281 Muscle weakness (generalized): Secondary | ICD-10-CM

## 2024-07-14 NOTE — Therapy (Signed)
 " OUTPATIENT PHYSICAL THERAPY DISCHARGE  PHYSICAL THERAPY DISCHARGE SUMMARY  Visits from Start of Care: 14   Current functional level related to goals / functional outcomes: See objective findings/assessment    Remaining deficits: See objective findings/assessment    Education / Equipment: See today's treatment/assessment      Patient agrees to discharge. Patient goals were partially met. Patient is being discharged due to being pleased with the current functional level.     Patient Name: Justin Burnett MRN: [id number] DOB:10-12-1965, 59 y.o., male Today's Date: 07/14/2024  END OF SESSION:  PT End of Session - 07/14/24 1110     Visit Number 12    Number of Visits 12    Date for Recertification  07/24/24    Authorization Type MCD    Authorization Time Period Approved 12 PT visits from 06/01/24-08/29/24    PT Start Time 1105    PT Stop Time 1130    PT Time Calculation (min) 25 min    Activity Tolerance Patient tolerated treatment well    Behavior During Therapy [facility] for tasks assessed/performed           Past Medical History:  Diagnosis Date   Abnormal EKG    Chronic bilateral low back pain without sciatica 03/13/2022   Essential (primary) hypertension 09/26/2021   Hyperlipidemia 10/17/2021   Hypertension    Inflammatory bowel disease    by 12/28/2023 colonoscopy   Nerve damage    Neuropathy    Osteoarthritis 11/03/2011   Osteoarthritis of knees, bilateral 06/13/2022   Paresthesia 03/13/2022   Prediabetes 10/17/2021   Past Surgical History:  Procedure Laterality Date   APPLICATION OF WOUND VAC Right 04/14/2024   Procedure: APPLICATION, WOUND VAC;  Surgeon: Jerri Kay HERO, MD;  Location: [facility] OR;  Service: Orthopedics;  Laterality: Right;   FINGER SURGERY Left    As a child. Tip of middle finger cut off in wood shop   I & D KNEE WITH POLY EXCHANGE Right 04/14/2024   Procedure: IRRIGATION AND DEBRIDEMENT KNEE WITH POLY EXCHANGE;  Surgeon: Jerri Kay HERO, MD;   Location: [facility] OR;  Service: Orthopedics;  Laterality: Right;  irrigation and debridement with possible zimmer poly exchange   TOTAL KNEE ARTHROPLASTY Left 10/26/2023   Procedure: ARTHROPLASTY, KNEE, TOTAL;  Surgeon: Jerri Kay HERO, MD;  Location: [facility] OR;  Service: Orthopedics;  Laterality: Left;   TOTAL KNEE ARTHROPLASTY Right 03/14/2024   Procedure: ARTHROPLASTY, KNEE, TOTAL;  Surgeon: Jerri Kay HERO, MD;  Location: [facility] OR;  Service: Orthopedics;  Laterality: Right;   Patient Active Problem List   Diagnosis Date Noted   Wound dehiscence 04/13/2024   Status post total right knee replacement 03/14/2024   Status post total left knee replacement 10/26/2023   Preop cardiovascular exam 08/11/2023   Obesity (BMI 30.0-34.9) 08/11/2023   Cardiac murmur 08/11/2023   Cigarette smoker 08/11/2023   Prominent abdominal aortic pulsation 08/11/2023   Hypertension    Nerve damage    Neuromuscular disorder (HCC)    Abnormal EKG    Primary osteoarthritis of right knee 06/13/2022   Paresthesia 03/13/2022   Chronic bilateral low back pain without sciatica 03/13/2022   Hyperlipidemia 10/17/2021   Prediabetes 10/17/2021   Essential (primary) hypertension 09/26/2021   Neuropathy    Osteoarthritis 11/03/2011    PCP: Jaycee Greig PARAS, NP   REFERRING PROVIDER: Jule Ronal CROME, PA-C  REFERRING DIAG: [phone] (ICD-10-CM) - Status post total right knee replacement   THERAPY DIAG:  Acute pain of right knee  Muscle weakness (generalized)  History of total right knee replacement  Rationale for Evaluation and Treatment: Rehabilitation  ONSET DATE: 03/14/24  SUBJECTIVE:   SUBJECTIVE STATEMENT: Discharge:  Pt reports partial compliance with HEP. He notes walking in his backyard yesterday and is in some pain today. Pt also mentioned low back pain which he wants to see his PCP for.   EVAL- Minimal R knee pain, main restriction is flexion.  Has discontinued hinged brace and is using knee sleeve.    PERTINENT  HISTORY: patient comes in today 9 weeks s/p right TKA and 5 weeks s/p right knee I&D and poly exchange.  He has been wearing a bledsoe brace locked in full extension.  Only complaining of a dull ache and overall doing well.  Right knee exam shows a fully healed surgical scar.  No signs of infection or cellulitis.  Calf is soft and non-tender.  Today, I have unlocked the brace to full flexion. We will let PT help him wean out of the brace as he does not feel comfortable doing so at this time.  I sent in a new referral for outpatient PT to begin range of motion exercises.  He will follow-up with Dr. Jerri in 4 weeks for recheck.  Call with concerns or questions in the meantime.  PAIN:  Are you having pain? Yes: NPRS scale: 2-6/10 Pain location: R knee Pain description: ache, sore Aggravating factors: bending, arising from sit Relieving factors: rest, sleeve  PRECAUTIONS: None  RED FLAGS: None   WEIGHT BEARING RESTRICTIONS: No  FALLS:  Has patient fallen in last 6 months? Yes. Number of falls 1  OCCUPATION: not working  PLOF: Independent  PATIENT GOALS: To regain my knee function  NEXT MD VISIT: TBD  OBJECTIVE:  Note: Objective measures were completed at Evaluation unless otherwise noted.  DIAGNOSTIC FINDINGS: X-rays of the right knee demonstrate a stable right total knee replacement  in good alignment   PATIENT SURVEYS:  LEFS 27/80 07/14/2024 - 32/80  EDEMA:  Suprapatellar effusion observed  MUSCLE LENGTH: deferred  POSTURE: deferred  PALPATION: deferred  LOWER EXTREMITY ROM:  A/PROM Right eval Left eval Right 06-11-24 Right 07/14/2024  Hip flexion      Hip extension      Hip abduction      Hip adduction      Hip internal rotation      Hip external rotation      Knee flexion 90/90d  96 A 110 A  Knee extension -18/0d  -15 A -15 A  Ankle dorsiflexion      Ankle plantarflexion      Ankle inversion      Ankle eversion       (Blank rows = not tested)  LOWER  EXTREMITY MMT:  MMT Right eval Left eval Right  07/14/2024  Hip flexion     Hip extension     Hip abduction     Hip adduction     Hip internal rotation     Hip external rotation     Knee flexion 4-  4+  Knee extension 4-  4+  Ankle dorsiflexion     Ankle plantarflexion     Ankle inversion     Ankle eversion      (Blank rows = not tested)  LOWER EXTREMITY SPECIAL TESTS:  deferred  FUNCTIONAL TESTS:  30 seconds chair stand test 0 reps 07/14/2024 - 9 without UE support  GAIT: Distance walked: 64ftx2 Assistive device utilized: None Level of assistance:  Complete Independence Comments: Mild antalgia                                                                                                                                TREATMENT:  [facility] Adult PT Treatment:                                                DATE: 07/14/2024   Therapeutic Activity:  Tests and measures   [facility] Adult PT Treatment:                                                DATE: 07/07/2024  Therapeutic Exercise: Recumbent bike x 8 mins  Sit to stand without UE support, R foot staggered back, 2 x 10  LAQ on high low table 5# ankle weight R  2 x 10 with ball between knees  BTB resisted R knee flexion 2 x 10 Slant board stretch 2x30  Therapeutic Activity:  Heel raises at counter 2x15 Side stepping at counter green TB x 3 laps Monster walk at counter fwd/back x 3 laps  Step ups on 6 step x 10 B  Runner's Step Up 6 x 5   [facility] Adult PT Treatment:                                                DATE: 07/05/2024  Therapeutic Exercise: Recumbent bike x 8 mins  Sit to stand without UE support, R foot staggered back, 2 x 10  LAQ on high low table 5# ankle weight R  2 x 10 with ball between knees  BTB resisted R knee flexion 2 x 10 Slant board stretch 2x30  Therapeutic Activity:  Heel raises at counter 2x15 Side stepping at counter green TB x 3 laps Step ups on 6 step x 10 B   [facility] Adult PT  Treatment:                                                DATE: 06/20/2024  Therapeutic Exercise: NuStep LE only level 4 x 6 mins Sit to stand without UE support, R foot staggered back, 2 x 10  LAQ on high low table R  2 x 10 with ball between knees  GTB resisted R knee flexion 2 x 10 Supine DKTC with ball x 20   Neuro Re-Ed:  Supine bridge with calf on ball  2 x 10  Heel raises at counter 3x10 Tandem walking at counter  x 2 laps  Side stepping at counter RTB x 3 laps  [facility] Adult PT Treatment:                                                DATE: 06-07-24 Therapeutic Exercise: NuStep LE only level 4 x 6 mins Sit to stand without UE support 2 x 10  LAQ on high low table R  2 x 10 with DF of foot GTB resisted R knee flexion 2 x 10 BTB resisted  R knee extension 2 x 10 Supine bridge with knees extended on foam roller 2 x 10  Seated AAROM knee flexion with opposite LE assist 2x10x5 hold  Supine  R Knee  Quad set on towel roll on ankle  3 x 10 (NT) Supine DKTC with ball x 20 (NT) Supine  heel  Slide  R 3 x 10  Neuro Re-Ed:  Heel raises in at counter 3x10 Heel walking at counter x 2 laps Tandem walking in at counter x 2 laps    PATIENT EDUCATION:  Education details: Discussed eval findings, rehab rationale and POC and patient is in agreement  Person educated: Patient Education method: Explanation and Handouts Education comprehension: verbalized understanding and needs further education  HOME EXERCISE PROGRAM: Access Code: [email or web address] URL: [email or web address] Date: 07/14/2024 Prepared by: Marijo Berber  Exercises - Side Stepping with Resistance at Ankles and Counter Support  - 1 x daily - 7 x weekly - 3 sets - 10 reps - Forward and Backward Monster Walk with Resistance at Ankles and Counter Support  - 1 x daily - 7 x weekly - 3 sets - 10 reps - Backward Monster Walk with Resistance at Ankles and Counter Support  - 1 x daily - 7 x weekly - 3 sets - 10 reps - Heel  Raises with Counter Support  - 1 x daily - 7 x weekly - 3 sets - 10 reps - Heel Toe Raises with Counter Support  - 1 x daily - 7 x weekly - 3 sets - 10 reps - Supine Single Knee to Chest Stretch  - 1 x daily - 7 x weekly - 3 sets - 1 reps - 30sec hold  ASSESSMENT:  CLINICAL IMPRESSION: Discharge:  Pt has been seen for 14 PT visits for his R TKA. Pt has improved knee flexion but not knee extension ROM. His strength has improved which shows in his STS ability. Pt has been mostly noncompliant with his HEP since the start of PT. He has met the majority of his goals. Pt given updated HEP and advised to walk more. He is discharged as of today.   EVAL-Patient is a 59 y.o. male who was seen today for physical therapy evaluation and treatment for R knee weakness and loss of motion following TKA with liner replacement following traumatic fall post-op resulting in wound dehiscence.   OBJECTIVE IMPAIRMENTS: Abnormal gait, decreased activity tolerance, decreased balance, decreased endurance, decreased mobility, difficulty walking, decreased ROM, decreased strength, and pain.   ACTIVITY LIMITATIONS: carrying, lifting, standing, squatting, stairs, and transfers  PERSONAL FACTORS: Age, Fitness, and Past/current experiences are also affecting patient's functional outcome.   REHAB POTENTIAL: Good  CLINICAL DECISION MAKING: Stable/uncomplicated  EVALUATION COMPLEXITY: Low   GOALS: Goals reviewed with patient? No  SHORT TERM GOALS: Target date: 06/13/2024    Patient to demonstrate independence in HEP  Baseline: [id number] Goal status: MET  2.  100d R knee flexion Baseline: 90d 07/14/2024: 110 Goal status: MET  3.  1 STS w/o arms Baseline: 0 07/14/2024: 9 Goal status: MET    LONG TERM GOALS: Target date: 07/19/2024    4/5 R knee strength F/E Baseline: 4-/5 in F/E 07/14/2024: 4+/5 F/E Goal status: MET  2.  Patient will increase 30s chair stand reps from 0 to 5 without arms to demonstrate  and improved functional ability with less pain/difficulty as well as reduce fall risk.  Baseline: 0 07/14/2024: 9 Goal status: MET  3.  Patient to increase AROM R knee to -5d extension and 115d flexion Baseline: -18d to 90d respectively 07/14/2024: see objective Goal status: not met  4.  Patient will score at least 40/80 on LEFS to signify clinically meaningful improvement in functional abilities.   Baseline: 27/80 07/14/2024: 32/80 Goal status: not met  5.  Patient will acknowledge 4/10 pain at least once during episode of care   Baseline: 6/10 worst pain 07/14/2024: see subjective  Goal status: MET    PLAN:  PT FREQUENCY: 2x/week  PT DURATION: 6 weeks  PLANNED INTERVENTIONS: 97110-Therapeutic exercises, 97530- Therapeutic activity, 97112- Neuromuscular re-education, 97535- Self Care, [id number]- Manual therapy, [phone]- Gait training, Patient/Family education, Balance training, and Stair training  PLAN FOR NEXT SESSION: HEP review and update, manual techniques as appropriate, aerobic tasks, ROM and flexibility activities, strengthening and PREs, TPDN, gait and balance training,aquatic therapy, modalities for pain and NMRE    For all possible CPT codes, reference the Planned Interventions line above.     Check all conditions that are expected to impact treatment: {Conditions expected to impact treatment:Complications related to surgery   Marijo Berber PT, DPT  07/14/2024 11:11 AM   "

## 2024-07-14 NOTE — Telephone Encounter (Signed)
 Please advise.

## 2024-07-15 ENCOUNTER — Other Ambulatory Visit (HOSPITAL_COMMUNITY): Payer: Self-pay

## 2024-07-15 MED ORDER — GABAPENTIN 800 MG PO TABS
800.0000 mg | ORAL_TABLET | Freq: Two times a day (BID) | ORAL | 2 refills | Status: AC
  Filled 2024-07-15 (×2): qty 60, 30d supply, fill #0

## 2024-07-15 MED ORDER — ATORVASTATIN CALCIUM 40 MG PO TABS
40.0000 mg | ORAL_TABLET | Freq: Every day | ORAL | 0 refills | Status: AC
  Filled 2024-07-15 (×2): qty 90, 90d supply, fill #0

## 2024-07-15 NOTE — Telephone Encounter (Signed)
 Complete

## 2024-07-16 ENCOUNTER — Encounter: Payer: Self-pay | Admitting: Internal Medicine

## 2024-07-18 ENCOUNTER — Other Ambulatory Visit: Payer: Self-pay

## 2024-07-19 ENCOUNTER — Other Ambulatory Visit (HOSPITAL_COMMUNITY): Payer: Self-pay

## 2024-07-19 ENCOUNTER — Encounter: Payer: Self-pay | Admitting: Family

## 2024-08-17 ENCOUNTER — Ambulatory Visit: Payer: Self-pay | Admitting: Family
